# Patient Record
Sex: Female | Born: 1940 | Race: Black or African American | Hispanic: No | Marital: Single | Smoking: Former smoker
Health system: Southern US, Community
[De-identification: ages and names within clinical notes are randomized; demographics above are authoritative.]

## PROBLEM LIST (undated history)

## (undated) DIAGNOSIS — F419 Anxiety disorder, unspecified: Secondary | ICD-10-CM

## (undated) DIAGNOSIS — Z85528 Personal history of other malignant neoplasm of kidney: Secondary | ICD-10-CM

## (undated) DIAGNOSIS — K279 Peptic ulcer, site unspecified, unspecified as acute or chronic, without hemorrhage or perforation: Secondary | ICD-10-CM

## (undated) DIAGNOSIS — M75101 Unspecified rotator cuff tear or rupture of right shoulder, not specified as traumatic: Secondary | ICD-10-CM

## (undated) DIAGNOSIS — E785 Hyperlipidemia, unspecified: Secondary | ICD-10-CM

## (undated) DIAGNOSIS — E7800 Pure hypercholesterolemia, unspecified: Secondary | ICD-10-CM

## (undated) DIAGNOSIS — F32A Depression, unspecified: Secondary | ICD-10-CM

## (undated) DIAGNOSIS — G43909 Migraine, unspecified, not intractable, without status migrainosus: Secondary | ICD-10-CM

## (undated) DIAGNOSIS — F329 Major depressive disorder, single episode, unspecified: Secondary | ICD-10-CM

## (undated) DIAGNOSIS — J449 Chronic obstructive pulmonary disease, unspecified: Secondary | ICD-10-CM

## (undated) DIAGNOSIS — M545 Low back pain, unspecified: Secondary | ICD-10-CM

## (undated) DIAGNOSIS — I10 Essential (primary) hypertension: Secondary | ICD-10-CM

## (undated) DIAGNOSIS — IMO0002 Reserved for concepts with insufficient information to code with codable children: Secondary | ICD-10-CM

## (undated) DIAGNOSIS — I739 Peripheral vascular disease, unspecified: Secondary | ICD-10-CM

## (undated) DIAGNOSIS — K581 Irritable bowel syndrome with constipation: Secondary | ICD-10-CM

## (undated) DIAGNOSIS — M654 Radial styloid tenosynovitis [de Quervain]: Secondary | ICD-10-CM

## (undated) DIAGNOSIS — M1990 Unspecified osteoarthritis, unspecified site: Secondary | ICD-10-CM

## (undated) HISTORY — DX: Unspecified osteoarthritis, unspecified site: M19.90

## (undated) HISTORY — DX: Pure hypercholesterolemia, unspecified: E78.00

## (undated) HISTORY — DX: Irritable bowel syndrome with constipation: K58.1

## (undated) HISTORY — PX: PR VEIN BYPASS GRAFT,AORTO-FEM-POP: 35551

## (undated) HISTORY — DX: Unspecified rotator cuff tear or rupture of right shoulder, not specified as traumatic: M75.101

## (undated) HISTORY — DX: Low back pain: M54.5

## (undated) HISTORY — DX: Peptic ulcer, site unspecified, unspecified as acute or chronic, without hemorrhage or perforation: K27.9

## (undated) HISTORY — DX: Peripheral vascular disease, unspecified: I73.9

## (undated) HISTORY — DX: Personal history of other malignant neoplasm of kidney: Z85.528

## (undated) HISTORY — DX: Migraine, unspecified, not intractable, without status migrainosus: G43.909

## (undated) HISTORY — DX: Low back pain, unspecified: M54.50

## (undated) HISTORY — DX: Reserved for concepts with insufficient information to code with codable children: IMO0002

## (undated) HISTORY — DX: Depression, unspecified: F32.A

## (undated) HISTORY — DX: Radial styloid tenosynovitis (de quervain): M65.4

## (undated) HISTORY — DX: Essential (primary) hypertension: I10

## (undated) HISTORY — DX: Hyperlipidemia, unspecified: E78.5

## (undated) HISTORY — DX: Major depressive disorder, single episode, unspecified: F32.9

## (undated) HISTORY — DX: Anxiety disorder, unspecified: F41.9

## (undated) HISTORY — PX: OTHER SURGICAL HISTORY: SHX169

---

## 2001-07-06 ENCOUNTER — Ambulatory Visit (HOSPITAL_COMMUNITY): Admission: RE | Admit: 2001-07-06 | Discharge: 2001-07-06 | Payer: Self-pay | Admitting: Internal Medicine

## 2003-04-17 ENCOUNTER — Emergency Department (HOSPITAL_COMMUNITY): Admission: EM | Admit: 2003-04-17 | Discharge: 2003-04-18 | Payer: Self-pay | Admitting: Emergency Medicine

## 2004-03-16 ENCOUNTER — Ambulatory Visit (HOSPITAL_COMMUNITY): Admission: RE | Admit: 2004-03-16 | Discharge: 2004-03-16 | Payer: Self-pay | Admitting: Emergency Medicine

## 2006-05-16 ENCOUNTER — Emergency Department (HOSPITAL_COMMUNITY): Admission: EM | Admit: 2006-05-16 | Discharge: 2006-05-16 | Payer: Self-pay | Admitting: Emergency Medicine

## 2006-06-07 ENCOUNTER — Ambulatory Visit: Payer: Self-pay | Admitting: Internal Medicine

## 2006-06-07 ENCOUNTER — Ambulatory Visit (HOSPITAL_COMMUNITY): Admission: RE | Admit: 2006-06-07 | Discharge: 2006-06-07 | Payer: Self-pay | Admitting: Internal Medicine

## 2006-06-07 DIAGNOSIS — F411 Generalized anxiety disorder: Secondary | ICD-10-CM | POA: Insufficient documentation

## 2006-06-07 DIAGNOSIS — I10 Essential (primary) hypertension: Secondary | ICD-10-CM | POA: Insufficient documentation

## 2006-06-07 DIAGNOSIS — E785 Hyperlipidemia, unspecified: Secondary | ICD-10-CM | POA: Insufficient documentation

## 2006-06-07 DIAGNOSIS — K279 Peptic ulcer, site unspecified, unspecified as acute or chronic, without hemorrhage or perforation: Secondary | ICD-10-CM | POA: Insufficient documentation

## 2006-06-07 DIAGNOSIS — M545 Low back pain, unspecified: Secondary | ICD-10-CM | POA: Insufficient documentation

## 2006-06-07 DIAGNOSIS — F329 Major depressive disorder, single episode, unspecified: Secondary | ICD-10-CM | POA: Insufficient documentation

## 2006-06-07 DIAGNOSIS — J309 Allergic rhinitis, unspecified: Secondary | ICD-10-CM | POA: Insufficient documentation

## 2006-06-09 ENCOUNTER — Encounter (INDEPENDENT_AMBULATORY_CARE_PROVIDER_SITE_OTHER): Payer: Self-pay | Admitting: Internal Medicine

## 2006-06-15 ENCOUNTER — Encounter (INDEPENDENT_AMBULATORY_CARE_PROVIDER_SITE_OTHER): Payer: Self-pay | Admitting: Internal Medicine

## 2006-06-28 ENCOUNTER — Ambulatory Visit (HOSPITAL_COMMUNITY): Admission: RE | Admit: 2006-06-28 | Discharge: 2006-06-28 | Payer: Self-pay | Admitting: Internal Medicine

## 2006-07-05 ENCOUNTER — Ambulatory Visit: Payer: Self-pay | Admitting: Internal Medicine

## 2006-08-01 ENCOUNTER — Ambulatory Visit: Payer: Self-pay | Admitting: Internal Medicine

## 2006-08-01 ENCOUNTER — Ambulatory Visit (HOSPITAL_COMMUNITY): Admission: RE | Admit: 2006-08-01 | Discharge: 2006-08-01 | Payer: Self-pay | Admitting: Internal Medicine

## 2006-08-01 ENCOUNTER — Telehealth (INDEPENDENT_AMBULATORY_CARE_PROVIDER_SITE_OTHER): Payer: Self-pay | Admitting: *Deleted

## 2006-08-02 ENCOUNTER — Encounter (INDEPENDENT_AMBULATORY_CARE_PROVIDER_SITE_OTHER): Payer: Self-pay | Admitting: Internal Medicine

## 2006-08-02 ENCOUNTER — Telehealth (INDEPENDENT_AMBULATORY_CARE_PROVIDER_SITE_OTHER): Payer: Self-pay | Admitting: *Deleted

## 2006-08-02 LAB — CONVERTED CEMR LAB
ALT: 14 units/L (ref 0–35)
AST: 13 units/L (ref 0–37)
Albumin: 4.6 g/dL (ref 3.5–5.2)
Alkaline Phosphatase: 89 units/L (ref 39–117)
BUN: 18 mg/dL (ref 6–23)
Basophils Absolute: 0 10*3/uL (ref 0.0–0.1)
Basophils Relative: 1 % (ref 0–1)
CO2: 20 meq/L (ref 19–32)
Calcium: 9.7 mg/dL (ref 8.4–10.5)
Chloride: 103 meq/L (ref 96–112)
Creatinine, Ser: 0.63 mg/dL (ref 0.40–1.20)
Eosinophils Absolute: 0.1 10*3/uL (ref 0.0–0.7)
Eosinophils Relative: 2 % (ref 0–5)
Glucose, Bld: 101 mg/dL — ABNORMAL HIGH (ref 70–99)
HCT: 43.1 % (ref 36.0–46.0)
Hemoglobin: 14.2 g/dL (ref 12.0–15.0)
Lymphocytes Relative: 38 % (ref 12–46)
Lymphs Abs: 2 10*3/uL (ref 0.7–3.3)
MCHC: 32.9 g/dL (ref 30.0–36.0)
MCV: 89.2 fL (ref 78.0–100.0)
Monocytes Absolute: 0.3 10*3/uL (ref 0.2–0.7)
Monocytes Relative: 6 % (ref 3–11)
Neutro Abs: 2.8 10*3/uL (ref 1.7–7.7)
Neutrophils Relative %: 54 % (ref 43–77)
Platelets: 221 10*3/uL (ref 150–400)
Potassium: 4.6 meq/L (ref 3.5–5.3)
Prealbumin: 24.5 mg/dL (ref 18.0–45.0)
RBC: 4.83 M/uL (ref 3.87–5.11)
RDW: 14 % (ref 11.5–14.0)
Sodium: 140 meq/L (ref 135–145)
TSH: 0.533 microintl units/mL (ref 0.350–5.50)
Total Bilirubin: 0.4 mg/dL (ref 0.3–1.2)
Total Protein: 7.6 g/dL (ref 6.0–8.3)
WBC: 5.3 10*3/uL (ref 4.0–10.5)

## 2006-08-03 ENCOUNTER — Encounter (INDEPENDENT_AMBULATORY_CARE_PROVIDER_SITE_OTHER): Payer: Self-pay | Admitting: Internal Medicine

## 2006-08-03 DIAGNOSIS — R930 Abnormal findings on diagnostic imaging of skull and head, not elsewhere classified: Secondary | ICD-10-CM | POA: Insufficient documentation

## 2006-08-04 ENCOUNTER — Encounter (INDEPENDENT_AMBULATORY_CARE_PROVIDER_SITE_OTHER): Payer: Self-pay | Admitting: Internal Medicine

## 2006-08-04 ENCOUNTER — Ambulatory Visit (HOSPITAL_COMMUNITY): Admission: RE | Admit: 2006-08-04 | Discharge: 2006-08-04 | Payer: Self-pay | Admitting: Internal Medicine

## 2006-08-04 LAB — HM MAMMOGRAPHY: HM Mammogram: NORMAL

## 2006-08-08 ENCOUNTER — Ambulatory Visit (HOSPITAL_COMMUNITY): Admission: RE | Admit: 2006-08-08 | Discharge: 2006-08-08 | Payer: Self-pay | Admitting: Internal Medicine

## 2006-08-08 ENCOUNTER — Encounter (INDEPENDENT_AMBULATORY_CARE_PROVIDER_SITE_OTHER): Payer: Self-pay | Admitting: Internal Medicine

## 2006-08-09 ENCOUNTER — Telehealth (INDEPENDENT_AMBULATORY_CARE_PROVIDER_SITE_OTHER): Payer: Self-pay | Admitting: *Deleted

## 2006-08-17 ENCOUNTER — Encounter (INDEPENDENT_AMBULATORY_CARE_PROVIDER_SITE_OTHER): Payer: Self-pay | Admitting: Internal Medicine

## 2006-08-17 ENCOUNTER — Telehealth (INDEPENDENT_AMBULATORY_CARE_PROVIDER_SITE_OTHER): Payer: Self-pay | Admitting: *Deleted

## 2006-08-26 ENCOUNTER — Ambulatory Visit: Payer: Self-pay | Admitting: Internal Medicine

## 2006-09-23 ENCOUNTER — Ambulatory Visit: Payer: Self-pay | Admitting: Internal Medicine

## 2006-09-23 LAB — CONVERTED CEMR LAB
Bilirubin Urine: NEGATIVE
Glucose, Urine, Semiquant: NEGATIVE
Nitrite: NEGATIVE
Protein, U semiquant: NEGATIVE
Specific Gravity, Urine: 1.02
Urobilinogen, UA: 0.2
WBC Urine, dipstick: NEGATIVE
pH: 5.5

## 2006-09-27 ENCOUNTER — Telehealth (INDEPENDENT_AMBULATORY_CARE_PROVIDER_SITE_OTHER): Payer: Self-pay | Admitting: *Deleted

## 2006-09-27 ENCOUNTER — Ambulatory Visit (HOSPITAL_COMMUNITY): Admission: RE | Admit: 2006-09-27 | Discharge: 2006-09-27 | Payer: Self-pay | Admitting: Internal Medicine

## 2006-10-03 ENCOUNTER — Encounter (INDEPENDENT_AMBULATORY_CARE_PROVIDER_SITE_OTHER): Payer: Self-pay | Admitting: Internal Medicine

## 2006-10-05 ENCOUNTER — Encounter (INDEPENDENT_AMBULATORY_CARE_PROVIDER_SITE_OTHER): Payer: Self-pay | Admitting: Internal Medicine

## 2006-10-05 ENCOUNTER — Telehealth (INDEPENDENT_AMBULATORY_CARE_PROVIDER_SITE_OTHER): Payer: Self-pay | Admitting: Internal Medicine

## 2006-10-06 ENCOUNTER — Telehealth (INDEPENDENT_AMBULATORY_CARE_PROVIDER_SITE_OTHER): Payer: Self-pay | Admitting: *Deleted

## 2006-10-07 ENCOUNTER — Ambulatory Visit: Payer: Self-pay | Admitting: Internal Medicine

## 2006-10-13 ENCOUNTER — Ambulatory Visit (HOSPITAL_COMMUNITY): Admission: RE | Admit: 2006-10-13 | Discharge: 2006-10-13 | Payer: Self-pay | Admitting: Internal Medicine

## 2006-10-13 ENCOUNTER — Ambulatory Visit: Payer: Self-pay | Admitting: Internal Medicine

## 2006-11-02 ENCOUNTER — Encounter (INDEPENDENT_AMBULATORY_CARE_PROVIDER_SITE_OTHER): Payer: Self-pay | Admitting: Internal Medicine

## 2006-12-05 ENCOUNTER — Ambulatory Visit: Payer: Self-pay | Admitting: Internal Medicine

## 2006-12-05 LAB — CONVERTED CEMR LAB
Bilirubin Urine: NEGATIVE
Glucose, Urine, Semiquant: NEGATIVE
Ketones, urine, test strip: NEGATIVE
Nitrite: NEGATIVE
Protein, U semiquant: NEGATIVE
Specific Gravity, Urine: 1.02
Urobilinogen, UA: 0.2
WBC Urine, dipstick: NEGATIVE
pH: 5.5

## 2006-12-06 ENCOUNTER — Encounter (INDEPENDENT_AMBULATORY_CARE_PROVIDER_SITE_OTHER): Payer: Self-pay | Admitting: Internal Medicine

## 2006-12-06 LAB — CONVERTED CEMR LAB
RBC / HPF: NONE SEEN (ref <3)
WBC, UA: NONE SEEN cells/hpf (ref <3)

## 2006-12-19 ENCOUNTER — Encounter (INDEPENDENT_AMBULATORY_CARE_PROVIDER_SITE_OTHER): Payer: Self-pay | Admitting: Internal Medicine

## 2007-01-02 ENCOUNTER — Ambulatory Visit: Payer: Self-pay | Admitting: Internal Medicine

## 2007-01-02 DIAGNOSIS — M1990 Unspecified osteoarthritis, unspecified site: Secondary | ICD-10-CM | POA: Insufficient documentation

## 2007-02-01 ENCOUNTER — Telehealth (INDEPENDENT_AMBULATORY_CARE_PROVIDER_SITE_OTHER): Payer: Self-pay | Admitting: *Deleted

## 2007-02-01 ENCOUNTER — Ambulatory Visit: Payer: Self-pay | Admitting: Internal Medicine

## 2007-04-12 ENCOUNTER — Telehealth (INDEPENDENT_AMBULATORY_CARE_PROVIDER_SITE_OTHER): Payer: Self-pay | Admitting: *Deleted

## 2007-04-12 ENCOUNTER — Ambulatory Visit: Payer: Self-pay | Admitting: Internal Medicine

## 2007-04-13 ENCOUNTER — Encounter (INDEPENDENT_AMBULATORY_CARE_PROVIDER_SITE_OTHER): Payer: Self-pay | Admitting: Internal Medicine

## 2007-04-13 LAB — CONVERTED CEMR LAB
BUN: 14 mg/dL (ref 6–23)
CO2: 24 meq/L (ref 19–32)
Calcium: 9.4 mg/dL (ref 8.4–10.5)
Chloride: 106 meq/L (ref 96–112)
Cholesterol: 233 mg/dL — ABNORMAL HIGH (ref 0–200)
Creatinine, Ser: 0.62 mg/dL (ref 0.40–1.20)
Glucose, Bld: 148 mg/dL — ABNORMAL HIGH (ref 70–99)
HDL: 75 mg/dL (ref >39)
LDL Cholesterol: 148 mg/dL — ABNORMAL HIGH (ref 0–99)
Potassium: 3.8 meq/L (ref 3.5–5.3)
Sodium: 142 meq/L (ref 135–145)
Total CHOL/HDL Ratio: 3.1
Triglycerides: 51 mg/dL (ref <150)
VLDL: 10 mg/dL (ref 0–40)

## 2007-04-18 ENCOUNTER — Ambulatory Visit (HOSPITAL_COMMUNITY): Admission: RE | Admit: 2007-04-18 | Discharge: 2007-04-18 | Payer: Self-pay | Admitting: Internal Medicine

## 2007-04-19 ENCOUNTER — Encounter (INDEPENDENT_AMBULATORY_CARE_PROVIDER_SITE_OTHER): Payer: Self-pay | Admitting: Internal Medicine

## 2007-04-19 ENCOUNTER — Telehealth (INDEPENDENT_AMBULATORY_CARE_PROVIDER_SITE_OTHER): Payer: Self-pay | Admitting: *Deleted

## 2007-04-21 ENCOUNTER — Ambulatory Visit (HOSPITAL_COMMUNITY): Admission: RE | Admit: 2007-04-21 | Discharge: 2007-04-21 | Payer: Self-pay | Admitting: Internal Medicine

## 2007-04-21 ENCOUNTER — Encounter (INDEPENDENT_AMBULATORY_CARE_PROVIDER_SITE_OTHER): Payer: Self-pay | Admitting: Internal Medicine

## 2007-04-24 ENCOUNTER — Encounter (INDEPENDENT_AMBULATORY_CARE_PROVIDER_SITE_OTHER): Payer: Self-pay | Admitting: Internal Medicine

## 2007-05-10 ENCOUNTER — Encounter (INDEPENDENT_AMBULATORY_CARE_PROVIDER_SITE_OTHER): Payer: Self-pay | Admitting: Internal Medicine

## 2007-05-17 ENCOUNTER — Encounter (HOSPITAL_COMMUNITY): Admission: RE | Admit: 2007-05-17 | Discharge: 2007-06-16 | Payer: Self-pay | Admitting: Neurosurgery

## 2007-06-20 ENCOUNTER — Encounter (HOSPITAL_COMMUNITY): Admission: RE | Admit: 2007-06-20 | Discharge: 2007-07-20 | Payer: Self-pay | Admitting: Neurosurgery

## 2007-07-11 ENCOUNTER — Ambulatory Visit: Payer: Self-pay | Admitting: Internal Medicine

## 2007-07-11 DIAGNOSIS — M79609 Pain in unspecified limb: Secondary | ICD-10-CM | POA: Insufficient documentation

## 2007-07-11 DIAGNOSIS — I739 Peripheral vascular disease, unspecified: Secondary | ICD-10-CM | POA: Insufficient documentation

## 2007-07-12 ENCOUNTER — Ambulatory Visit (HOSPITAL_COMMUNITY): Admission: RE | Admit: 2007-07-12 | Discharge: 2007-07-12 | Payer: Self-pay | Admitting: Internal Medicine

## 2007-07-18 DIAGNOSIS — D4959 Neoplasm of unspecified behavior of other genitourinary organ: Secondary | ICD-10-CM | POA: Insufficient documentation

## 2007-07-20 ENCOUNTER — Encounter (INDEPENDENT_AMBULATORY_CARE_PROVIDER_SITE_OTHER): Payer: Self-pay | Admitting: Internal Medicine

## 2007-07-26 ENCOUNTER — Encounter (INDEPENDENT_AMBULATORY_CARE_PROVIDER_SITE_OTHER): Payer: Self-pay | Admitting: Internal Medicine

## 2007-07-26 LAB — CONVERTED CEMR LAB: Creatinine, Ser: 0.73 mg/dL (ref 0.40–1.20)

## 2007-07-31 ENCOUNTER — Ambulatory Visit (HOSPITAL_COMMUNITY): Admission: RE | Admit: 2007-07-31 | Discharge: 2007-07-31 | Payer: Self-pay | Admitting: Internal Medicine

## 2007-07-31 ENCOUNTER — Encounter (INDEPENDENT_AMBULATORY_CARE_PROVIDER_SITE_OTHER): Payer: Self-pay | Admitting: Internal Medicine

## 2007-08-03 ENCOUNTER — Encounter (INDEPENDENT_AMBULATORY_CARE_PROVIDER_SITE_OTHER): Payer: Self-pay | Admitting: Internal Medicine

## 2007-08-07 ENCOUNTER — Ambulatory Visit: Payer: Self-pay | Admitting: Surgery

## 2007-08-07 ENCOUNTER — Encounter (INDEPENDENT_AMBULATORY_CARE_PROVIDER_SITE_OTHER): Payer: Self-pay | Admitting: Internal Medicine

## 2007-08-24 ENCOUNTER — Ambulatory Visit: Payer: Self-pay | Admitting: Surgery

## 2007-08-24 ENCOUNTER — Ambulatory Visit (HOSPITAL_COMMUNITY): Admission: RE | Admit: 2007-08-24 | Discharge: 2007-08-24 | Payer: Self-pay | Admitting: Surgery

## 2007-11-02 ENCOUNTER — Encounter (INDEPENDENT_AMBULATORY_CARE_PROVIDER_SITE_OTHER): Payer: Self-pay | Admitting: Internal Medicine

## 2007-11-06 ENCOUNTER — Ambulatory Visit: Payer: Self-pay | Admitting: Surgery

## 2007-12-06 ENCOUNTER — Encounter (INDEPENDENT_AMBULATORY_CARE_PROVIDER_SITE_OTHER): Payer: Self-pay | Admitting: Internal Medicine

## 2007-12-25 ENCOUNTER — Ambulatory Visit: Payer: Self-pay | Admitting: Surgery

## 2008-01-24 ENCOUNTER — Inpatient Hospital Stay (HOSPITAL_COMMUNITY): Admission: RE | Admit: 2008-01-24 | Discharge: 2008-01-28 | Payer: Self-pay | Admitting: Surgery

## 2008-01-24 ENCOUNTER — Ambulatory Visit: Payer: Self-pay | Admitting: Surgery

## 2008-01-25 ENCOUNTER — Encounter: Payer: Self-pay | Admitting: Surgery

## 2008-01-30 ENCOUNTER — Encounter (INDEPENDENT_AMBULATORY_CARE_PROVIDER_SITE_OTHER): Payer: Self-pay | Admitting: Internal Medicine

## 2008-02-12 ENCOUNTER — Ambulatory Visit: Payer: Self-pay | Admitting: Surgery

## 2008-02-21 ENCOUNTER — Encounter (INDEPENDENT_AMBULATORY_CARE_PROVIDER_SITE_OTHER): Payer: Self-pay | Admitting: Internal Medicine

## 2008-04-09 ENCOUNTER — Ambulatory Visit: Payer: Self-pay | Admitting: Internal Medicine

## 2008-04-09 DIAGNOSIS — F17200 Nicotine dependence, unspecified, uncomplicated: Secondary | ICD-10-CM | POA: Insufficient documentation

## 2008-04-09 DIAGNOSIS — M25519 Pain in unspecified shoulder: Secondary | ICD-10-CM | POA: Insufficient documentation

## 2008-04-17 ENCOUNTER — Encounter (INDEPENDENT_AMBULATORY_CARE_PROVIDER_SITE_OTHER): Payer: Self-pay | Admitting: Internal Medicine

## 2008-04-17 LAB — CONVERTED CEMR LAB
ALT: <8 units/L (ref 0–35)
AST: 10 units/L (ref 0–37)
Albumin: 4.5 g/dL (ref 3.5–5.2)
Alkaline Phosphatase: 81 units/L (ref 39–117)
BUN: 10 mg/dL (ref 6–23)
CO2: 25 meq/L (ref 19–32)
Calcium: 9.8 mg/dL (ref 8.4–10.5)
Chloride: 103 meq/L (ref 96–112)
Cholesterol: 246 mg/dL — ABNORMAL HIGH (ref 0–200)
Creatinine, Ser: 0.64 mg/dL (ref 0.40–1.20)
Glucose, Bld: 78 mg/dL (ref 70–99)
HDL: 79 mg/dL (ref >39)
LDL Cholesterol: 153 mg/dL — ABNORMAL HIGH (ref 0–99)
Potassium: 4.7 meq/L (ref 3.5–5.3)
Sodium: 143 meq/L (ref 135–145)
Total Bilirubin: 0.4 mg/dL (ref 0.3–1.2)
Total CHOL/HDL Ratio: 3.1
Total Protein: 7.4 g/dL (ref 6.0–8.3)
Triglycerides: 71 mg/dL (ref <150)
VLDL: 14 mg/dL (ref 0–40)

## 2008-04-29 ENCOUNTER — Ambulatory Visit: Payer: Self-pay | Admitting: Surgery

## 2008-05-02 ENCOUNTER — Encounter (INDEPENDENT_AMBULATORY_CARE_PROVIDER_SITE_OTHER): Payer: Self-pay | Admitting: Internal Medicine

## 2008-05-07 ENCOUNTER — Ambulatory Visit: Payer: Self-pay | Admitting: Internal Medicine

## 2008-05-14 ENCOUNTER — Telehealth (INDEPENDENT_AMBULATORY_CARE_PROVIDER_SITE_OTHER): Payer: Self-pay | Admitting: Internal Medicine

## 2008-05-29 ENCOUNTER — Encounter (INDEPENDENT_AMBULATORY_CARE_PROVIDER_SITE_OTHER): Payer: Self-pay | Admitting: Internal Medicine

## 2008-07-02 ENCOUNTER — Ambulatory Visit (HOSPITAL_COMMUNITY): Admission: RE | Admit: 2008-07-02 | Discharge: 2008-07-02 | Payer: Self-pay | Admitting: Internal Medicine

## 2008-07-02 ENCOUNTER — Ambulatory Visit: Payer: Self-pay | Admitting: Internal Medicine

## 2008-07-09 ENCOUNTER — Encounter (INDEPENDENT_AMBULATORY_CARE_PROVIDER_SITE_OTHER): Payer: Self-pay | Admitting: Internal Medicine

## 2008-07-09 ENCOUNTER — Encounter (HOSPITAL_COMMUNITY): Admission: RE | Admit: 2008-07-09 | Discharge: 2008-08-08 | Payer: Self-pay | Admitting: Internal Medicine

## 2008-07-17 ENCOUNTER — Encounter (INDEPENDENT_AMBULATORY_CARE_PROVIDER_SITE_OTHER): Payer: Self-pay | Admitting: Internal Medicine

## 2008-07-31 ENCOUNTER — Encounter (INDEPENDENT_AMBULATORY_CARE_PROVIDER_SITE_OTHER): Payer: Self-pay | Admitting: Internal Medicine

## 2008-08-12 ENCOUNTER — Ambulatory Visit: Payer: Self-pay | Admitting: Surgery

## 2008-08-14 ENCOUNTER — Encounter (HOSPITAL_COMMUNITY): Admission: RE | Admit: 2008-08-14 | Discharge: 2008-09-13 | Payer: Self-pay | Admitting: Internal Medicine

## 2008-08-28 ENCOUNTER — Encounter (INDEPENDENT_AMBULATORY_CARE_PROVIDER_SITE_OTHER): Payer: Self-pay | Admitting: Internal Medicine

## 2008-09-03 ENCOUNTER — Encounter (INDEPENDENT_AMBULATORY_CARE_PROVIDER_SITE_OTHER): Payer: Self-pay | Admitting: Internal Medicine

## 2008-09-04 ENCOUNTER — Ambulatory Visit: Payer: Self-pay | Admitting: Internal Medicine

## 2008-09-05 ENCOUNTER — Ambulatory Visit (HOSPITAL_COMMUNITY): Admission: RE | Admit: 2008-09-05 | Discharge: 2008-09-05 | Payer: Self-pay | Admitting: Internal Medicine

## 2008-09-05 ENCOUNTER — Encounter: Payer: Self-pay | Admitting: Orthopedic Surgery

## 2008-09-12 ENCOUNTER — Telehealth (INDEPENDENT_AMBULATORY_CARE_PROVIDER_SITE_OTHER): Payer: Self-pay | Admitting: *Deleted

## 2008-09-18 ENCOUNTER — Encounter (INDEPENDENT_AMBULATORY_CARE_PROVIDER_SITE_OTHER): Payer: Self-pay | Admitting: Internal Medicine

## 2008-10-03 ENCOUNTER — Ambulatory Visit: Payer: Self-pay | Admitting: Orthopedic Surgery

## 2008-10-03 DIAGNOSIS — M75120 Complete rotator cuff tear or rupture of unspecified shoulder, not specified as traumatic: Secondary | ICD-10-CM | POA: Insufficient documentation

## 2008-10-04 ENCOUNTER — Ambulatory Visit: Payer: Self-pay | Admitting: Internal Medicine

## 2008-10-05 ENCOUNTER — Encounter (INDEPENDENT_AMBULATORY_CARE_PROVIDER_SITE_OTHER): Payer: Self-pay | Admitting: Internal Medicine

## 2008-10-07 LAB — CONVERTED CEMR LAB
ALT: <8 units/L (ref 0–35)
AST: 12 units/L (ref 0–37)
Albumin: 4.4 g/dL (ref 3.5–5.2)
Alkaline Phosphatase: 67 units/L (ref 39–117)
BUN: 16 mg/dL (ref 6–23)
Basophils Absolute: 0 10*3/uL (ref 0.0–0.1)
Basophils Relative: 1 % (ref 0–1)
CO2: 24 meq/L (ref 19–32)
Calcium: 9.9 mg/dL (ref 8.4–10.5)
Chloride: 106 meq/L (ref 96–112)
Cholesterol: 191 mg/dL (ref 0–200)
Creatinine, Ser: 0.76 mg/dL (ref 0.40–1.20)
Eosinophils Absolute: 0.2 10*3/uL (ref 0.0–0.7)
Eosinophils Relative: 4 % (ref 0–5)
Glucose, Bld: 81 mg/dL (ref 70–99)
HCT: 41.4 % (ref 36.0–46.0)
HDL: 65 mg/dL (ref >39)
Hemoglobin: 13.8 g/dL (ref 12.0–15.0)
LDL Cholesterol: 116 mg/dL — ABNORMAL HIGH (ref 0–99)
Lymphocytes Relative: 31 % (ref 12–46)
Lymphs Abs: 2 10*3/uL (ref 0.7–4.0)
MCHC: 33.3 g/dL (ref 30.0–36.0)
MCV: 85 fL (ref 78.0–100.0)
Monocytes Absolute: 0.5 10*3/uL (ref 0.1–1.0)
Monocytes Relative: 8 % (ref 3–12)
Neutro Abs: 3.5 10*3/uL (ref 1.7–7.7)
Neutrophils Relative %: 56 % (ref 43–77)
Platelets: 220 10*3/uL (ref 150–400)
Potassium: 4.5 meq/L (ref 3.5–5.3)
RBC: 4.87 M/uL (ref 3.87–5.11)
RDW: 14.7 % (ref 11.5–15.5)
Sodium: 139 meq/L (ref 135–145)
Total Bilirubin: 0.5 mg/dL (ref 0.3–1.2)
Total CHOL/HDL Ratio: 2.9
Total Protein: 7.1 g/dL (ref 6.0–8.3)
Triglycerides: 51 mg/dL (ref <150)
VLDL: 10 mg/dL (ref 0–40)
WBC: 6.3 10*3/uL (ref 4.0–10.5)

## 2008-10-14 ENCOUNTER — Ambulatory Visit: Payer: Self-pay | Admitting: Orthopedic Surgery

## 2008-10-16 ENCOUNTER — Encounter (INDEPENDENT_AMBULATORY_CARE_PROVIDER_SITE_OTHER): Payer: Self-pay | Admitting: Internal Medicine

## 2008-10-21 ENCOUNTER — Ambulatory Visit: Payer: Self-pay | Admitting: Surgery

## 2008-10-23 ENCOUNTER — Telehealth: Payer: Self-pay | Admitting: Orthopedic Surgery

## 2008-10-24 ENCOUNTER — Encounter: Payer: Self-pay | Admitting: Orthopedic Surgery

## 2008-10-25 ENCOUNTER — Ambulatory Visit: Payer: Self-pay | Admitting: Orthopedic Surgery

## 2008-10-25 ENCOUNTER — Ambulatory Visit (HOSPITAL_COMMUNITY): Admission: RE | Admit: 2008-10-25 | Discharge: 2008-10-25 | Payer: Self-pay | Admitting: Orthopedic Surgery

## 2008-10-28 ENCOUNTER — Telehealth (INDEPENDENT_AMBULATORY_CARE_PROVIDER_SITE_OTHER): Payer: Self-pay | Admitting: *Deleted

## 2008-10-29 ENCOUNTER — Ambulatory Visit: Payer: Self-pay | Admitting: Orthopedic Surgery

## 2008-10-30 ENCOUNTER — Encounter (HOSPITAL_COMMUNITY): Admission: RE | Admit: 2008-10-30 | Discharge: 2008-11-29 | Payer: Self-pay | Admitting: Orthopedic Surgery

## 2008-10-30 ENCOUNTER — Encounter: Payer: Self-pay | Admitting: Orthopedic Surgery

## 2008-11-05 ENCOUNTER — Ambulatory Visit: Payer: Self-pay | Admitting: Orthopedic Surgery

## 2008-11-30 ENCOUNTER — Encounter (HOSPITAL_COMMUNITY): Admission: RE | Admit: 2008-11-30 | Discharge: 2008-12-30 | Payer: Self-pay | Admitting: Orthopedic Surgery

## 2008-12-02 ENCOUNTER — Encounter: Payer: Self-pay | Admitting: Orthopedic Surgery

## 2008-12-03 ENCOUNTER — Ambulatory Visit: Payer: Self-pay | Admitting: Orthopedic Surgery

## 2009-01-01 ENCOUNTER — Encounter (HOSPITAL_COMMUNITY): Admission: RE | Admit: 2009-01-01 | Discharge: 2009-01-15 | Payer: Self-pay | Admitting: Orthopedic Surgery

## 2009-01-07 ENCOUNTER — Encounter: Payer: Self-pay | Admitting: Orthopedic Surgery

## 2009-01-21 ENCOUNTER — Ambulatory Visit: Payer: Self-pay | Admitting: Orthopedic Surgery

## 2009-01-29 ENCOUNTER — Inpatient Hospital Stay (HOSPITAL_COMMUNITY): Admission: EM | Admit: 2009-01-29 | Discharge: 2009-02-04 | Payer: Self-pay | Admitting: Emergency Medicine

## 2009-01-29 ENCOUNTER — Ambulatory Visit: Payer: Self-pay | Admitting: Orthopedic Surgery

## 2009-01-30 ENCOUNTER — Encounter: Payer: Self-pay | Admitting: Orthopedic Surgery

## 2009-02-03 ENCOUNTER — Encounter: Payer: Self-pay | Admitting: Orthopedic Surgery

## 2009-02-04 ENCOUNTER — Inpatient Hospital Stay: Admission: AD | Admit: 2009-02-04 | Discharge: 2009-02-19 | Payer: Self-pay | Admitting: Internal Medicine

## 2009-02-07 ENCOUNTER — Ambulatory Visit (HOSPITAL_COMMUNITY): Admission: RE | Admit: 2009-02-07 | Discharge: 2009-02-07 | Payer: Self-pay | Admitting: Internal Medicine

## 2009-02-10 ENCOUNTER — Encounter: Payer: Self-pay | Admitting: Orthopedic Surgery

## 2009-02-12 ENCOUNTER — Ambulatory Visit: Payer: Self-pay | Admitting: Orthopedic Surgery

## 2009-02-12 DIAGNOSIS — S82843A Displaced bimalleolar fracture of unspecified lower leg, initial encounter for closed fracture: Secondary | ICD-10-CM | POA: Insufficient documentation

## 2009-02-19 ENCOUNTER — Ambulatory Visit: Payer: Self-pay | Admitting: Orthopedic Surgery

## 2009-02-21 ENCOUNTER — Encounter: Payer: Self-pay | Admitting: Orthopedic Surgery

## 2009-02-24 ENCOUNTER — Encounter: Payer: Self-pay | Admitting: Orthopedic Surgery

## 2009-03-04 ENCOUNTER — Encounter: Payer: Self-pay | Admitting: Orthopedic Surgery

## 2009-03-06 ENCOUNTER — Ambulatory Visit: Payer: Self-pay | Admitting: Orthopedic Surgery

## 2009-03-07 ENCOUNTER — Encounter: Payer: Self-pay | Admitting: Orthopedic Surgery

## 2009-03-07 ENCOUNTER — Telehealth: Payer: Self-pay | Admitting: Orthopedic Surgery

## 2009-03-12 ENCOUNTER — Encounter: Payer: Self-pay | Admitting: Orthopedic Surgery

## 2009-03-13 ENCOUNTER — Ambulatory Visit: Payer: Self-pay | Admitting: Orthopedic Surgery

## 2009-03-19 ENCOUNTER — Encounter: Payer: Self-pay | Admitting: Orthopedic Surgery

## 2009-03-27 ENCOUNTER — Ambulatory Visit: Payer: Self-pay | Admitting: Orthopedic Surgery

## 2009-04-08 ENCOUNTER — Telehealth: Payer: Self-pay | Admitting: Orthopedic Surgery

## 2009-04-10 ENCOUNTER — Ambulatory Visit: Payer: Self-pay | Admitting: Orthopedic Surgery

## 2009-04-11 ENCOUNTER — Encounter: Payer: Self-pay | Admitting: Orthopedic Surgery

## 2009-04-16 ENCOUNTER — Encounter: Payer: Self-pay | Admitting: Orthopedic Surgery

## 2009-04-21 ENCOUNTER — Encounter: Payer: Self-pay | Admitting: Orthopedic Surgery

## 2009-04-21 ENCOUNTER — Telehealth: Payer: Self-pay | Admitting: Orthopedic Surgery

## 2009-04-24 ENCOUNTER — Ambulatory Visit: Payer: Self-pay | Admitting: Orthopedic Surgery

## 2009-04-30 ENCOUNTER — Encounter: Payer: Self-pay | Admitting: Orthopedic Surgery

## 2009-05-06 ENCOUNTER — Telehealth: Payer: Self-pay | Admitting: Orthopedic Surgery

## 2009-05-07 ENCOUNTER — Telehealth: Payer: Self-pay | Admitting: Orthopedic Surgery

## 2009-05-07 ENCOUNTER — Encounter: Payer: Self-pay | Admitting: Orthopedic Surgery

## 2009-05-08 ENCOUNTER — Ambulatory Visit: Payer: Self-pay | Admitting: Orthopedic Surgery

## 2009-05-09 ENCOUNTER — Encounter: Payer: Self-pay | Admitting: Orthopedic Surgery

## 2009-05-15 ENCOUNTER — Ambulatory Visit: Payer: Self-pay | Admitting: Orthopedic Surgery

## 2009-05-16 ENCOUNTER — Encounter: Payer: Self-pay | Admitting: Orthopedic Surgery

## 2009-05-22 ENCOUNTER — Ambulatory Visit: Payer: Self-pay | Admitting: Orthopedic Surgery

## 2009-05-22 DIAGNOSIS — T8132XA Disruption of internal operation (surgical) wound, not elsewhere classified, initial encounter: Secondary | ICD-10-CM | POA: Insufficient documentation

## 2009-05-22 DIAGNOSIS — T81329A Deep disruption or dehiscence of operation wound, unspecified, initial encounter: Secondary | ICD-10-CM | POA: Insufficient documentation

## 2009-06-05 ENCOUNTER — Ambulatory Visit: Payer: Self-pay | Admitting: Orthopedic Surgery

## 2009-06-12 ENCOUNTER — Encounter: Payer: Self-pay | Admitting: Orthopedic Surgery

## 2009-06-19 ENCOUNTER — Ambulatory Visit: Payer: Self-pay | Admitting: Orthopedic Surgery

## 2009-06-26 ENCOUNTER — Telehealth: Payer: Self-pay | Admitting: Orthopedic Surgery

## 2009-07-10 ENCOUNTER — Encounter (INDEPENDENT_AMBULATORY_CARE_PROVIDER_SITE_OTHER): Payer: Self-pay | Admitting: *Deleted

## 2009-07-18 ENCOUNTER — Ambulatory Visit: Payer: Self-pay | Admitting: Orthopedic Surgery

## 2009-07-18 ENCOUNTER — Ambulatory Visit (HOSPITAL_COMMUNITY): Admission: RE | Admit: 2009-07-18 | Discharge: 2009-07-20 | Payer: Self-pay | Admitting: Orthopedic Surgery

## 2009-07-23 ENCOUNTER — Telehealth: Payer: Self-pay | Admitting: Orthopedic Surgery

## 2009-07-23 ENCOUNTER — Ambulatory Visit: Payer: Self-pay | Admitting: Orthopedic Surgery

## 2009-07-24 ENCOUNTER — Ambulatory Visit: Payer: Self-pay | Admitting: Orthopedic Surgery

## 2009-07-29 ENCOUNTER — Ambulatory Visit: Payer: Self-pay | Admitting: Orthopedic Surgery

## 2009-07-30 ENCOUNTER — Ambulatory Visit: Payer: Self-pay | Admitting: Orthopedic Surgery

## 2009-07-31 ENCOUNTER — Telehealth: Payer: Self-pay | Admitting: Orthopedic Surgery

## 2009-08-07 ENCOUNTER — Ambulatory Visit: Payer: Self-pay | Admitting: Orthopedic Surgery

## 2009-08-07 DIAGNOSIS — M24573 Contracture, unspecified ankle: Secondary | ICD-10-CM | POA: Insufficient documentation

## 2009-08-07 DIAGNOSIS — M24576 Contracture, unspecified foot: Secondary | ICD-10-CM

## 2009-08-13 ENCOUNTER — Telehealth: Payer: Self-pay | Admitting: Orthopedic Surgery

## 2009-08-13 ENCOUNTER — Encounter: Payer: Self-pay | Admitting: Orthopedic Surgery

## 2009-08-20 ENCOUNTER — Ambulatory Visit: Payer: Self-pay | Admitting: Orthopedic Surgery

## 2009-09-01 ENCOUNTER — Ambulatory Visit: Payer: Self-pay | Admitting: Orthopedic Surgery

## 2009-09-18 ENCOUNTER — Ambulatory Visit: Payer: Self-pay | Admitting: Orthopedic Surgery

## 2009-10-23 ENCOUNTER — Ambulatory Visit: Payer: Self-pay | Admitting: Orthopedic Surgery

## 2009-11-20 ENCOUNTER — Ambulatory Visit: Payer: Self-pay | Admitting: Orthopedic Surgery

## 2009-12-25 ENCOUNTER — Ambulatory Visit: Payer: Self-pay | Admitting: Orthopedic Surgery

## 2009-12-25 DIAGNOSIS — M8569 Other cyst of bone, multiple sites: Secondary | ICD-10-CM | POA: Insufficient documentation

## 2009-12-26 ENCOUNTER — Encounter: Payer: Self-pay | Admitting: Orthopedic Surgery

## 2009-12-29 ENCOUNTER — Encounter: Payer: Self-pay | Admitting: Orthopedic Surgery

## 2009-12-29 LAB — CONVERTED CEMR LAB: BUN: 17 mg/dL (ref 6–23)

## 2009-12-31 ENCOUNTER — Ambulatory Visit (HOSPITAL_COMMUNITY)
Admission: RE | Admit: 2009-12-31 | Discharge: 2009-12-31 | Payer: Self-pay | Source: Home / Self Care | Attending: Orthopedic Surgery | Admitting: Orthopedic Surgery

## 2009-12-31 ENCOUNTER — Telehealth: Payer: Self-pay | Admitting: Orthopedic Surgery

## 2009-12-31 LAB — CONVERTED CEMR LAB
Collection Interval-CRCL: 24 hr
Creatinine 24 HR UR: 726 mg/24hr (ref 700–1800)
Creatinine Clearance: 72 mL/min — ABNORMAL LOW (ref 75–115)
Creatinine, Urine: 88 mg/dL

## 2010-01-06 ENCOUNTER — Ambulatory Visit: Payer: Self-pay | Admitting: Orthopedic Surgery

## 2010-01-06 DIAGNOSIS — D179 Benign lipomatous neoplasm, unspecified: Secondary | ICD-10-CM | POA: Insufficient documentation

## 2010-02-08 ENCOUNTER — Encounter: Payer: Self-pay | Admitting: Internal Medicine

## 2010-02-08 ENCOUNTER — Encounter: Payer: Self-pay | Admitting: Family Medicine

## 2010-02-17 NOTE — Assessment & Plan Note (Signed)
Summary: 2 WK RE-CK WOUND LT ANKLE/EVERCARE,MEDICAI/CAF   Visit Type:  wound check Referring Provider:  ap er Primary Provider:  Erle Crocker MD  CC:  right ankle wound check.  History of Present Illness: I saw Alicia [name] in the office today for a followup visit.  She is a 70 years old woman with the complaint of:  wound check right ankle.  Allergies: 1)  Aspirin   Medications Added to Medication List This Visit: 1)  Bactrim Ds 800-160 Mg Tabs (Sulfamethoxazole-trimethoprim) .Marland Kitchen.. 1 by mouth two times a day 2)  Hydrocodone-acetaminophen 10-325 Mg Tabs (Hydrocodone-acetaminophen) .Marland Kitchen.. 1 -2 by mouth q 4 as needed pain  Patient Instructions: 1)  continue wound vac  2)  return in 2 weeks  Prescriptions: HYDROCODONE-ACETAMINOPHEN 10-325 MG TABS (HYDROCODONE-ACETAMINOPHEN) 1 -2 by mouth q 4 as needed pain  #90 x 5   Entered and Authorized by:   Fuller Canada MD   Signed by:   Fuller Canada MD on 04/24/2009   Method used:   Print then Give to Patient   RxID:   [id number] BACTRIM DS 800-160 MG TABS (SULFAMETHOXAZOLE-TRIMETHOPRIM) 1 by mouth two times a day  #60 x 0   Entered and Authorized by:   Fuller Canada MD   Signed by:   Fuller Canada MD on 04/24/2009   Method used:   Faxed to ...       [facility] (retail)       [address]       [place], [place]  [address]       Ph: [phone]       Fax: [phone]   RxID:   [id number]

## 2010-02-17 NOTE — Assessment & Plan Note (Signed)
Summary: 1 M RE-CK ANKLE/POST OP/EVERCARE,MCD/CAF   Visit Type:  Follow-up Referring Tyrian Peart:  ap er Primary Marlo Goodrich:  na  CC:  RECHECK ANKLE .  History of Present Illness: status post thickness skin graft and percutaneous Achilles tendon release after previous open treatment internal fixation of the right ankle  DATES OF SURGERY   OTIF ANKLE 1.12.2011  STSG 7.1.2011  Percocet 5 for pain, no refills needed. No ATBS are being taken.  Today is recheck.  the wound continues to decrease in size and has now stabilized very well.  It is approximately 5 mm long 2 mm deep and 2 mm wide.  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 2)  continue wound care

## 2010-02-17 NOTE — Assessment & Plan Note (Signed)
Summary: RE-CK/WOUND CHECK/POST OP/SEC HORIZ/CAF   Visit Type:  Follow-up Referring Provider:  ap er Primary Provider:  na  CC:  POST OP.  History of Present Illness: Alicia [name] comes in today for recheck and have the dressing on her RIGHT ankle after a split thickness skin graft and a percutaneous release of her Achilles tendon  07/18/09 DOS.  POD 12  Today is wound check.  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

## 2010-02-17 NOTE — Progress Notes (Signed)
Summary: wound vac hurting her leg  Phone Note Call from Patient   Summary of Call: Alicia [name] (08/23/1940) called today asking if she can remove the wound vac from her leg.  York Spaniel it is not draining right and is hurting her leg and her pain meds are not stoping her pain. Told her you are not in the office today and to call her homehealth nurse to check the machine. She called back and left a message that she could not get in touch with the nurse and she will just wait to see you on Thursday (05/08/09) I called [facility] and spoke with Dewayne Hatch and relayed  Yasha's message.  She said she will get in touch with her nurse and ask her to check the machine and her leg. Initial call taken by: Jacklynn Ganong,  May 06, 2009 2:20 PM

## 2010-02-17 NOTE — Progress Notes (Signed)
Summary: [facility] to discharge   Phone Note Other Incoming   Caller: [facility] Summary of Call: Victorino Dike, [facility] nurse, called to relay that patient is being discharged from home care Friday 04/11/09.  Patient is scheduled here for appt Thurs, 04/10/09.  Nurse direct ph # [phone]. Initial call taken by: Cammie Sickle,  April 08, 2009 3:56 PM

## 2010-02-17 NOTE — Miscellaneous (Signed)
Summary: Home Care Report  Home Care Report   Imported By: Elvera Maria 03/14/2009 10:25:16  _____________________________________________________________________  External Attachment:    Type:   Image     Comment:   advanced ot order

## 2010-02-17 NOTE — Assessment & Plan Note (Signed)
Summary: 2 WK RE-CK WOUND/POST OP/EVERCARE,MEDICAID/CAF   Visit Type:  Follow-up Referring Provider:  ap er Primary Provider:  Erle Crocker MD  CC:  recheck wound on ankle.  History of Present Illness:   DOS  1.12.2011  Procedure OTIF BIMALL LEFT ANKLE 01/29/09  Medication LORCET PLUS helps.  Meds, Lorcet plus, Xanax, Stool softener, Pravastatin, Ibuprofen 400 as needed, Amlodopine, Simvastatin, Gabapentin, Actonel, Vesicare, Pletal, Calcium.  Currently treated for wound which developed over the anterior ankle full thickness what appears to be a burn not sure if it came from the cast or if it came from rubbing in the cast.  She's also developed a plantar flexion contracture and the ankle which we will address after the wound has healed  Recommend wound VAC.  Dressing changes did help wound decreased in size improved in appearance large eschars tissue over the wound at this time may need debridement.        Allergies: 1)  Aspirin   Impression & Recommendations:  Problem # 1:  CLOSED BIMALLEOLAR FRACTURE (ICD-824.4) Assessment Comment Only  Orders: Home Health Referral (Home Health) Post-Op Check [id number])  Problem # 2:  AFTERCARE FOLLOW SURGERY MUSCULOSKEL SYSTEM NEC (ICD-V58.78) Assessment: Comment Only  Patient Instructions: 1)  Wound Vac everyday for right ankle 2)  Come back in 2 weeks recheck wound

## 2010-02-17 NOTE — Progress Notes (Signed)
Summary: wound vac is off  Phone Note Other Incoming Call back at cathy foster from [facility]   Summary of Call: says that they took wound vac off 05/06/09 due to pain without drainage, they applied wet to dry dressing and patient states that she does not want the wound vac back on, she has appt to see [place] 05/08/09, advised to keep vac off, wet to dry dressing ok Initial call taken by: Ether Griffins,  May 07, 2009 2:17 PM

## 2010-02-17 NOTE — Assessment & Plan Note (Signed)
Summary: 1 M RE-CK WOUND/POST OP 07/18/09/EVERCARE,MCD/CAF   Visit Type:  Follow-up Referring Joselyne Spake:  ap er Primary Dixon Luczak:  na   History of Present Illness: status post thickness skin graft and percutaneous Achilles tendon release after previous open treatment internal fixation of the right ankle  07/18/09 most recent surgery Percocet 5 for pain.  Today is recheck.  primarily has medial pain over the posterior tibial tendon, swelling in the foot is down, wound is healing nail.  Recommend continue dressing changes until the wound heals then addressed. A posterior tibial tendinitis and Achilles contracture.    Allergies: 1)  Aspirin   Other Orders: Post-Op Check (45409)  Patient Instructions: 1)  Please schedule a follow-up appointment in 1 month. Prescriptions: PERCOCET 5-325 MG TABS (OXYCODONE-ACETAMINOPHEN) 1 by mouth q 4 as needed pain  #84 x 0   Entered and Authorized by:   Fuller Canada MD   Signed by:   Fuller Canada MD on 09/18/2009   Method used:   Print then Give to Patient   RxID:   [id number]

## 2010-02-17 NOTE — Progress Notes (Signed)
Summary: wound leaking,having pain  Phone Note Call from Patient   Summary of Call: Alicia [name] (01/23/1940) called complaining of leaking from wound site and could not sleep last night due to pain. Advised her to come right into  the office to be checked, but she did not have transportation to get here before Dr. Romeo Apple had to leave to go to the hospital for surgery.  Told he if she got worse to go to the ER. Her # [phone] Initial call taken by: Jacklynn Ganong,  June 26, 2009 11:14 AM

## 2010-02-17 NOTE — Miscellaneous (Signed)
Summary: [facility] plan of care  [facility] plan of care   Imported By: Jacklynn Ganong 03/06/2009 07:57:04  _____________________________________________________________________  External Attachment:    Type:   Image     Comment:   External Document

## 2010-02-17 NOTE — Miscellaneous (Signed)
Summary: [facility] orders  [facility] orders   Imported By: Jacklynn Ganong 05/22/2009 10:20:41  _____________________________________________________________________  External Attachment:    Type:   Image     Comment:   External Document

## 2010-02-17 NOTE — Medication Information (Signed)
Summary: Order for wheelchair  Order for wheelchair   Imported By: Jacklynn Ganong 02/21/2009 09:18:58  _____________________________________________________________________  External Attachment:    Type:   Image     Comment:   External Document

## 2010-02-17 NOTE — Letter (Signed)
Summary: Internal Other  Internal Other   Imported By: Elvera Maria 03/12/2009 14:00:31  _____________________________________________________________________  External Attachment:    Type:   Image     Comment:   orders for ot [facility]

## 2010-02-17 NOTE — Letter (Signed)
Summary: Physician's orders for equipment  Physician's orders for equipment   Imported By: Jacklynn Ganong 03/25/2009 09:50:27  _____________________________________________________________________  External Attachment:    Type:   Image     Comment:   External Document

## 2010-02-17 NOTE — Assessment & Plan Note (Signed)
Summary: RE-CK/DRESSING CHG/POST OP 07/18/09/EVERCARE,MCD/CAF   Visit Type:  Follow-up, postop Referring Provider:  ap er Primary Provider:  na   History of Present Illness: Larene comes in today for recheck and have the dressing on her RIGHT ankle after a split thickness skin graft and a percutaneous release of her Achilles tendon  Seems to be doing well at this point graft looks good.  I did not have any Adaptic to change the dressings were put on a Betadine dressing she will get that change tomorrow should be okay for one day  She's going to see Dr. Malvin Johns on Monday he will call me to let me know and I need to do another dressing change  When it is okay as far as the split thickness skin graft does we can place her in a Cam Walker and let her weight-bear as tolerated and start physical therapy  Allergies: 1)  Aspirin   Impression & Recommendations:  Problem # 1:  AFTERCARE FOLLOW SURGERY SKIN&SUBCUT TISSUE NEC (ICD-V58.77)  Orders: Post-Op Check (16109)  Problem # 2:  DISRUPTION OF INTERNAL OPERATION SURGICAL WOUND (ICD-998.31)  Orders: Post-Op Check (60454)  Problem # 3:  CLOSED BIMALLEOLAR FRACTURE (ICD-824.4)  Orders: Post-Op Check (09811)  Patient Instructions: 1)  followup Monday with Dr. Malvin Johns and then followup with me by phone call

## 2010-02-17 NOTE — Progress Notes (Signed)
Summary: Alicia [name] to see Dr. Malvin Johns this Friday  Phone Note Other Incoming   Summary of Call: Dr. Malvin Johns said to tell you that he will see Alicia [name] in his office this Friday,07/25/09.  I gave her this message and she is going to call his office for a time to come in. Initial call taken by: Jacklynn Ganong,  July 23, 2009 3:47 PM

## 2010-02-17 NOTE — Assessment & Plan Note (Signed)
Summary: Wrist pain/LBP   Vital Signs:  Patient Profile:   70 Years Old Female Height:     61.5 inches O2 Sat:      97 % O2 treatment:    Room Air Pulse rate:   89 / minute Resp:     8 per minute BP sitting:   136 / 72  (right arm)  Vitals Entered By: Lutricia Horsfall (February 01, 2007 9:05 AM)                 Procedure Note  Injections: The patient complains of pain. Consent signed: yes  Procedure # 1: joint injection    Region: lateral    Location: right wrist    Comment: after written informed consent was obtained, the area was cleaned with chlorprep in the usual sterile fashion.  2% lidocaine with epinephrine was used for local anesthetic.  1/2 cc depomedrol 80mg /ml with 1/2 cc lidocaine were injected without difficult. The patient tolerated the procedure and there were no immediate complications.    Chief Complaint:  followup HTN and joint pain.  History of Present Illness: Here for routine follow up.  Her right wrist is still hurting and the IM steroids did not help much.  She says that over the Christmas and New Years holiday she had back pain and pain down her left leg.  She got some Doan's pills and it helped a bit but she still has some back and leg pain.  She says she didn't care about the stomach pain from the Doan's pills because she needed the pain relief in her back and leg.  She says the pain had been progressing and there was no preceeding injury.    Current Allergies: ASPIRIN  Past Medical History:    Reviewed history from 01/02/2007 and no changes required:       Allergic rhinitis       Anxiety       Depression       Hyperlipidemia       Hypertension       Low back pain       Peptic ulcer disease       IBS with constipation       cataracts       broken knee       migraines       Osteoarthritis       DeQuervains Tenosynovitis--recurrent      Physical Exam  General:     alert and underweight appearing.   Msk:     + ttp over right  lateral wrist.    Impression & Recommendations:  Problem # 1:  DE QUERVAIN'S TENOSYNOVITIS (ICD-727.04) Joint injection done. Orders: Joint Aspirate / Injection, Intermediate (20605)   Problem # 2:  LOW BACK PAIN (ICD-724.2) With the leg pain and back pain, we are going to get an MRI for further evaluation. Her updated medication list for this problem includes:    Ultracet 37.5-325 Mg Tabs (Tramadol-acetaminophen) .Marland Kitchen... 1 by mouth 4 times per day  Orders: MRI (MRI)   Complete Medication List: 1)  Amlodipine Besylate 10 Mg Tabs (Amlodipine besylate) .Marland Kitchen.. 1 by mouth once daily 2)  Ultracet 37.5-325 Mg Tabs (Tramadol-acetaminophen) .Marland Kitchen.. 1 by mouth 4 times per day     ]  Appended Document: Orders Update    Clinical Lists Changes  Orders: Added new Service order of Depo- Medrol 40mg  (J1030) - Signed

## 2010-02-17 NOTE — Assessment & Plan Note (Signed)
Summary: 1 WK WOUND CHECK/LT ANKLE/EVERCARE,MEDICAID/CAF   Visit Type:  Follow-up Referring Provider:  ap er Primary Provider:  Erle Crocker MD  CC:  recheck left ankle wound.  History of Present Illness: DOS  1.12.2011  Procedure OTIF BIMALL LEFT ANKLE 01/29/09  Medication Norco 10 helps.  Meds, Norco 10, Xanax, Stool softener, Pravastatin, Ibuprofen 400 as needed, Amlodopine, Simvastatin, Gabapentin, Actonel, Vesicare, Pletal, Calcium, Bactrim DS.  Currently treated for wound which developed over the anterior ankle full thickness what appears to be a burn not sure if it came from the cast or if it came from rubbing in the cast.  She's also developed a plantar flexion contracture and the ankle which we will address after the wound has healed  Today is recheck after wet to dry dressings.  further debridement of the ankle of necrotic tissue.  Return one week for repeat debridement.  Right now we have a 7 x 5 cm anterior lateral ankle wound with necrotic tissue and necrotic portions of the anterior tibialis, which I debrided again today.  Probably, will need an Achilles tendon lengthening, plus or minus skin graft    Allergies: 1)  Aspirin   Other Orders: Post-Op Check (14782)  Patient Instructions: 1)  Please schedule a follow-up appointment in 1 week.

## 2010-02-17 NOTE — Assessment & Plan Note (Signed)
Summary: weight loss   Vital Signs:  Patient Profile:   70 Years Old Female Height:     61.5 inches Weight:      94 pounds BMI:     17.54 O2 Sat:      97 % Pulse rate:   77 / minute Resp:     8 per minute BP sitting:   126 / 82  (left arm)  Vitals Entered By: Lutricia Horsfall (August 26, 2006 9:12 AM) Oxygen therapy Room Air               Chief Complaint:  followup.  History of Present Illness:  Ms. Freeburg presents for follow-up.  She says she is feeling better.  The injection we gave her for her wrist has cleared up the pain.  Two days ago she started the nicotine patch to stop smoking.  Since her last appointment she did have her chest x-ray which was followed by a chest CT which showed no masses.  She also had a mammogram which was normal.  She still says she thinks her last colonoscopy was two or 3 years ago at any 10, but I cannot find this results in E-chart.  Current Allergies: ASPIRIN  Past Medical History:    Reviewed history from 06/07/2006 and no changes required:       Allergic rhinitis       Anxiety       Depression       Hyperlipidemia       Hypertension       Low back pain       Peptic ulcer disease       IBS with constipation       cataracts       arthritis       broken knee       migraines    Risk Factors:  Mammogram History:     Date of Last Mammogram:  08/04/2006    Results:  normal     Physical Exam  General:     alert and underweight appearing.      Impression & Recommendations:  Problem # 1:  WEIGHT LOSS, ABNORMAL (ICD-783.21) she has lost an additional pound.  Her labs looked surprisingly good.  We do need to see when her last colonoscopy wastime as it appears to have been more than two to 3 years ago if I can't find it in E-chart.  We will contact [facility] to see if they have a copy.  Problem # 2:  HYPERTENSION (ICD-401.9) her blood pressure looks good today. Her updated medication list for this problem includes:  Amlodipine Besylate 10 Mg Tabs (Amlodipine besylate) .Marland Kitchen... 1 by mouth once daily   Complete Medication List: 1)  Amlodipine Besylate 10 Mg Tabs (Amlodipine besylate) .Marland Kitchen.. 1 by mouth once daily 2)  Ultracet 37.5-325 Mg Tabs (Tramadol-acetaminophen) .Marland Kitchen.. 1 by mouth 4 times per day            Preventive Care Screening  Mammogram:    Date:  08/04/2006    Next Due:  08/2007    Results:  normal

## 2010-02-17 NOTE — Miscellaneous (Signed)
Summary: Home Care Report Dischg summary  Home Care Report   Imported By: Cammie Sickle 06/19/2009 14:32:04  _____________________________________________________________________  External Attachment:    Type:   Image     Comment:   External Document

## 2010-02-17 NOTE — Assessment & Plan Note (Signed)
Summary: POST OP 1/ RT FOOT/ANKLE SURG 07/18/09/SEC HORIZ,MCD/CAF   Visit Type:  Follow-up Referring Provider:  ap er Primary Provider:  na  CC:  post op 1.  History of Present Illness: I saw Alicia [name] in the office today for a followup visit.  She is a 70 years old woman with the complaint of:  post op 1 split thickness skin graft, debridement of necrotic tissue and skin superficial and Percutaneous Achilles tendon release.  Dr. Malvin Johns did the STSG  Post op 1 today.  POD 5 for dressing change.  Dr. Malvin Johns change the dressing yesterday, I changes today and we'll change it tomorrow everything looks great she is placed back in a posterior splint with the foot in neutral position    Allergies: 1)  Aspirin   Impression & Recommendations:  Problem # 1:  DISRUPTION OF INTERNAL OPERATION SURGICAL WOUND (ICD-998.31)  Orders: Post-Op Check (52841)  Problem # 2:  CLOSED BIMALLEOLAR FRACTURE (ICD-824.4)  Orders: Post-Op Check (32440)  Problem # 3:  AFTERCARE FOLLOW SURGERY MUSCULOSKEL SYSTEM NEC (ICD-V58.78)  Orders: Post-Op Check (10272)  Problem # 4:  AFTERCARE FOLLOW SURGERY SKIN&SUBCUT TISSUE NEC (ICD-V58.77)  Patient Instructions: 1)  Thursday recheck dressing change

## 2010-02-17 NOTE — Miscellaneous (Signed)
Summary: [facility] Plan of treatment  [facility] Plan of treatment   Imported By: Jacklynn Ganong 05/06/2009 09:07:53  _____________________________________________________________________  External Attachment:    Type:   Image     Comment:   External Document

## 2010-02-17 NOTE — Assessment & Plan Note (Signed)
Summary: 2 WK RE-CK WOUND RT ANKLE/EVERCARE,MEDICAID/CAF   Visit Type:  Follow-up Referring Provider:  ap er Primary Provider:  Erle Crocker MD  CC:  recheck wound on ankle.  History of Present Illness: DOS  1.12.2011  Procedure OTIF BIMALL LEFT ANKLE 01/29/09  Medication: For the ankle, Percocet 5 mg, Bactrim DS.  Meds, Percocet 5, Xanax, Stool softener, Pravastatin, Ibuprofen 400 as needed, Amlodopine, Simvastatin, Gabapentin, Actonel, Vesicare, Pletal, Calcium, Bactrim DS.  complains of pain in her foot near the toes as well as pain in the medial side of the ankle.  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 1 By Mouth Once Daily 2)  Pletal 100 Mg Tabs (Cilostazol) .Marland Kitchen.. 1 By Mouth Two Times A Day 3)  Naproxen 500 Mg Tabs (Naproxen) .Marland Kitchen.. 1 By Mouth Two Times A Day For Shoulder Pain 4)  Pravastatin Sodium 40 Mg Tabs (Pravastatin Sodium) .... Take One Tab Once Daily 5)  Vesicare 10 Mg Tabs (Solifenacin Succinate) .... Take 1 Tablet By Mouth Once A Day 6)  Gabapentin 300 Mg Caps (Gabapentin) .... Take 1 Tablet By Mouth Once A Day 7)  Lyrica 50 Mg Caps (Pregabalin) 8)  Bactrim Ds 800-160 Mg Tabs (Sulfamethoxazole-Trimethoprim) .Marland Kitchen.. 1 By Mouth Two Times A Day 9)  Hydrocodone-Acetaminophen 10-325 Mg Tabs (Hydrocodone-Acetaminophen) .Marland Kitchen.. 1 -2 By Mouth Q 4 As Needed Pain 10)  Percocet 5-325 Mg Tabs (Oxycodone-Acetaminophen) .Marland Kitchen.. 1 By Mouth Q 4 As Needed Pain 11)  Gabapentin 100 Mg Caps (Gabapentin) .Marland Kitchen.. 1 By Mouth Tid  Allergies (verified): 1)  Aspirin  Past History:  Past Medical History: Last updated: 11-01-2008 Allergic rhinitis Anxiety Depression Hyperlipidemia Hypertension Low back pain Peptic ulcer disease IBS with constipation cataracts broken knee migraines Osteoarthritis DeQuervains  Tenosynovitis--recurrent PVD right rotator cuff tear  Past Surgical History: Last updated: 04/09/2008 breast tumor back tumor R inguinal herniorraphy R femur fracture repair Right femoral-to-above the knee popliteal arterial bypass  Family History: Last updated: 05-10-2007 father-deceased--in house fire mother-deceased-HTN, CVA  Social History: Last updated: 2008/11/01 Single lives alone--SO died 26-Jun-2004 Current Smoker-- used to smoke 3-4 ppd, now pack lasts 1 week Alcohol use-no--quit for 1 year Drug use-no  Risk Factors: Smoking Status: current (06/07/2006)  Review of Systems Neurologic:  See HPI. Musculoskeletal:  See HPI.  The review of systems is negative for Constitutional, Cardiovascular, Respiratory, Gastrointestinal, Genitourinary, Endocrine, Psychiatric, Skin, HEENT, Immunology, and Hemoatologic.  Physical Exam  Skin:  6 X 3 CM WITH GRANULATION TISSUE over the dorsal medial ankle.  There is a lateral wound which is normal Inguinal Nodes:  no significant adenopathy Psych:  alert and cooperative; normal mood and affect; normal attention span and concentration   Foot/Ankle Exam  General:    Well-developed, well-nourished ,small body habitus; no deformities, normal grooming.    Gait:    ambulates with crutches minimal weight bearing  Inspection:    deformity: ankle plantar flexion contracture deformity  Palpation:    tenderness in the plantar aspect of the foot as well as the medial posterior tibial tendon area and posterior medial leg  Vascular:    normal capillary refill and adequate dorsalis pedis pulses  Sensory:    reports normal sensation to soft touch obviously response to pain denies tingling  Motor:    plantar flexion contracture  Ankle Exam:    Right:  Inspection:  Abnormal    Palpation:  Abnormal    Stability:  stable     The upper extremities have normal appearance, ROM, strength and stability.  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 Patient Level III (11914) Ankle x-ray complete,  minimum 3 views (78295) Foot x-ray complete, minimum 3 views (62130)  Problem # 2:  DISRUPTION OF INTERNAL OPERATION SURGICAL WOUND (ICD-998.31)  Orders: Est. Patient Level III (86578) Ankle x-ray complete,  minimum 3 views (46962) Foot x-ray complete, minimum 3 views (95284)  Problem # 3:  CLOSED BIMALLEOLAR FRACTURE (ICD-824.4)  Orders: Est. Patient Level III (13244) Ankle x-ray complete,  minimum 3 views (01027) Foot x-ray complete, minimum 3 views (25366)  Patient Instructions: 1)  scheduled for [place] with DR BRADFORD TO ASSIST SKIN GRAFT POSSIBLE PERCUTANEOUS ACHILLES RELEASE  2)  INCREASE THE GABAPENTIN TO 2 TABS three times a day

## 2010-02-17 NOTE — Miscellaneous (Signed)
Summary: [facility] orders  [facility] orders   Imported By: Jacklynn Ganong 04/23/2009 15:42:39  _____________________________________________________________________  External Attachment:    Type:   Image     Comment:   External Document

## 2010-02-17 NOTE — Progress Notes (Signed)
Summary: patient saw Dr. Malvin Johns today  Phone Note Call from Patient   Caller: Patient Summary of Call: Patient called to notify as requested - she saw Dr. Malvin Johns today, 07/31/09 and does not have to return there till next Thurs, 08/07/09.  States she told Dr.Bradford she has changed her own dressings in the past.  When is she to return here?  [place] Spaniel hopes "not as soon as tomorrow." Please advise, ph [phone]. Initial call taken by: Cammie Sickle,  July 31, 2009 2:16 PM  Follow-up for Phone Call        thursday is ok here as well Follow-up by: Fuller Canada MD,  August 04, 2009 8:34 AM  Additional Follow-up for Phone Call Additional follow up Details #1::        Called patient for appointment. Left voice mail msg. Patient returned call, advised, scheduling for Thurs. Additional Follow-up by: Cammie Sickle,  August 04, 2009 10:12 AM

## 2010-02-17 NOTE — Assessment & Plan Note (Signed)
Summary: RE-CK WOUND/POST OP 07/18/09/EVERCARE,MCD/CAF   Visit Type:  Follow-up Referring Provider:  ap er Primary Provider:  na  CC:  wound check post op.  History of Present Illness: status post thickness skin graft and the cutaneous Achilles tendon release after previous open treatment internal fixation of the right ankle 07/18/09 DOS.  Percocet 5 for pain.  Has some bleeding, minimal.  Has alot of itching.  The wound is healing nicely it's just about granulating completely it's probably about 4 or 5 cm long by about a centimeter and a half looks good  She is complaining of some itching over the proximal donor site it looks fine.  She does have a lot of swelling in her foot and ankle and more pain than she had last time so as to stop the exercise program.  She thinks that maybe she's been walking on it too much.  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 One by mouth q 4-6 hrs as needed for itching  Patient Instructions: 1)  Please schedule a follow-up appointment in 1 month. 2)  continue dressing changes  3)  See Dr Malvin Johns as he has ordered  Prescriptions: BENADRYL 25 MG CAPS (DIPHENHYDRAMINE HCL) one by mouth q 4-6 hrs as needed for itching  #60 x 1   Entered and Authorized by:   Fuller Canada MD   Signed by:   Fuller Canada MD on 08/20/2009   Method used:   Faxed to ...       [facility]       [address]       [place], [place]  [address]       Ph: [phone]       Fax: [phone]   RxID:   [id number] BACTRIM DS 800-160 MG TABS (SULFAMETHOXAZOLE-TRIMETHOPRIM) 1 by mouth two times a day  #60 x 0   Entered and Authorized by:   Fuller Canada MD   Signed by:   Fuller Canada MD on 08/20/2009   Method used:   Faxed to ...       [facility]       [address]       [place], [place]  [address]       Ph: [phone]       Fax: [phone]   RxID:   [id number]

## 2010-02-17 NOTE — Miscellaneous (Signed)
Summary: [facility] orders  [facility] orders   Imported By: Jacklynn Ganong 05/28/2009 15:26:41  _____________________________________________________________________  External Attachment:    Type:   Image     Comment:   External Document

## 2010-02-17 NOTE — Assessment & Plan Note (Signed)
Summary: RECK WOUND/EVERCARE/BSF   Visit Type:  Follow-up Referring Provider:  ap er Primary Provider:  na  CC:  right foot ankle.  History of Present Illness: Alicia [name] comes in today for recheck and have the dressing on her RIGHT ankle after a split thickness skin graft and a percutaneous release of her Achilles tendon  07/18/09 DOS.  POD 12  Seems to be doing well at this point graft looks good.  everything looking good she is exercising the foot she maintained her neutral dorsiflexed ankle followup to be arranged after Dr. breast disease the patient      Allergies: 1)  Aspirin   Impression & Recommendations:  Problem # 1:  AFTERCARE FOLLOW SURGERY SKIN&SUBCUT TISSUE NEC (ICD-V58.77) Assessment Improved  Orders: Post-Op Check (16109)  Patient Instructions: 1)  to be determined

## 2010-02-17 NOTE — Miscellaneous (Signed)
Summary: mri with contrast 12/31/09 230pm come back for results  Clinical Lists Changes   precert for [facility] [phone] exp 02/09/10 no precert needed for medicaid, advised pt of appt, to come back in for results, she is to get lab work for [employer] either today or [employer]

## 2010-02-17 NOTE — Letter (Signed)
Summary: surgery order RT foot  surgery order RT foot   Imported By: Cammie Sickle 07/04/2009 10:29:12  _____________________________________________________________________  External Attachment:    Type:   Image     Comment:   External Document

## 2010-02-17 NOTE — Assessment & Plan Note (Signed)
Summary: 1 WK RE-CK/XRAY/POST OP/EVERCARE,MEDICAID/CAF   Visit Type:  Follow-up Referring Provider:  ap er Primary Provider:  Erle Crocker MD  CC:  right ankle post op fracture.  History of Present Illness: DOS  1.12.2011  Procedure OTIF BIMALL LEFT ANKLE 01/29/09  Medication LORCET PLUS    she has a problem with the skin overlying the anteromedial ankle joint with what looks  benign or a cast rubbing problem.  She has lost a superficial layer of epidermis.  A Betadine impregnated Xeroform dressing is applied with a soft Ace wrap and Aircast.    Meds: Actonel, Stool softener, Simvastatin, Enablex, Sanctura, Pletal.  Return2 week for wound check.    3 views left ankle ordered   normal appearance hardware left ankel mortise is reduced and fracture has healed   Allergies: 1)  Aspirin   Impression & Recommendations:  Problem # 1:  CLOSED BIMALLEOLAR FRACTURE (ICD-824.4)  xrays  fracture healing   Orders: Post-Op Check (10272) Ankle x-ray complete,  minimum 3 views (53664)  Problem # 2:  AFTERCARE FOLLOW SURGERY MUSCULOSKEL SYSTEM NEC (ICD-V58.78)  Orders: Post-Op Check (40347) Ankle x-ray complete,  minimum 3 views (42595)  Patient Instructions: 1)  2 weeks wound check

## 2010-02-17 NOTE — Miscellaneous (Signed)
Summary: Rehab Report  Rehab Report   Imported By: Elvera Maria 03/12/2009 13:59:32  _____________________________________________________________________  External Attachment:    Type:   Image     Comment:   [facility] ot report

## 2010-02-17 NOTE — Miscellaneous (Signed)
Summary: Home Care order  Home Care order   Imported By: Cammie Sickle 05/17/2009 10:27:42  _____________________________________________________________________  External Attachment:    Type:   Image     Comment:   External Document

## 2010-02-17 NOTE — Miscellaneous (Signed)
  Clinical Lists Changes  Medications: Rx of PERCOCET 5-325 MG TABS (OXYCODONE-ACETAMINOPHEN) 1 by mouth q 4 as needed pain;  #84 x 0;  Signed;  Entered by: Fuller Canada MD;  Authorized by: Fuller Canada MD;  Method used: Print then Give to Patient    Prescriptions: PERCOCET 5-325 MG TABS (OXYCODONE-ACETAMINOPHEN) 1 by mouth q 4 as needed pain  #84 x 0   Entered and Authorized by:   Fuller Canada MD   Signed by:   Fuller Canada MD on 08/13/2009   Method used:   Print then Give to Patient   RxID:   [id number]

## 2010-02-17 NOTE — Assessment & Plan Note (Signed)
Summary: 2 WEEKS RECK WOUND RT ANKLE/EVERCARE/MEDICAID/BSF   Visit Type:  Follow-up Referring Provider:  ap er Primary Provider:  Erle Crocker MD  CC:  recheck rt ankle wound.  History of Present Illness: DOS  1.12.2011  Procedure OTIF BIMALL LEFT ANKLE 01/29/09  Medication Norco 10 helps, does not help much anymore, out of ATBS for 2 days.  Meds, Norco 10, Xanax, Stool softener, Pravastatin, Ibuprofen 400 as needed, Amlodopine, Simvastatin, Gabapentin, Actonel, Vesicare, Pletal, Calcium, Bactrim DS.  Today is a 2 week recheck after wet to dry dressings.  Patient on crutches today.  The wound is shrinking and the granulation tissue is excellent.  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 Patient Level II (07371)  Medications Added to Medication List This Visit: 1)  Percocet 5-325 Mg Tabs (Oxycodone-acetaminophen) .Marland Kitchen.. 1 by mouth q 4 as needed pain 2)  Gabapentin 100 Mg Caps (Gabapentin) .Marland Kitchen.. 1 by mouth tid  Patient Instructions: 1)  Continue wet to dry dressings  2)  return 2 weeks  3)  You have 2 new meds for pain start today  Prescriptions: PERCOCET 5-325 MG TABS (OXYCODONE-ACETAMINOPHEN) 1 by mouth q 4 as needed pain  #84 x 0   Entered and Authorized by:   Fuller Canada MD   Signed by:   Fuller Canada MD on 06/05/2009   Method used:   Print then Give to Patient   RxID:   [id number] GABAPENTIN 100 MG CAPS (GABAPENTIN) 1 by mouth tid  #60 x 5   Entered and Authorized by:   Fuller Canada MD   Signed by:   Fuller Canada MD on 06/05/2009   Method used:   Print then Give to Patient   RxID:   [id number] PERCOCET 5-325 MG TABS (OXYCODONE-ACETAMINOPHEN) 1 by mouth q 4 as needed pain  #84 x 0   Entered and Authorized by:   Fuller Canada MD  Signed by:   Fuller Canada MD on 06/05/2009   Method used:   Handwritten   RxID:   [id number]

## 2010-02-17 NOTE — Progress Notes (Signed)
Summary: call to patient Rx   Phone Note Outgoing Call   Call placed to: Patient Summary of Call: called patient re: Rx pick up. Left message on ans machine to return call Initial call taken by: Cammie Sickle,  August 13, 2009 2:14 PM

## 2010-02-17 NOTE — Progress Notes (Signed)
Summary: needs pain prescription,having more pain   Phone Note Call from Patient   Summary of Call: Alicia [name] needs a new prescription for Oxycodone.  She said she is having more pain than usual since yesterday. [phone] Initial call taken by: Jacklynn Ganong,  August 13, 2009 1:30 PM

## 2010-02-17 NOTE — Assessment & Plan Note (Signed)
Summary: 2 WK WOUND RE-CK/POST OP+2 M RE-CK SHOULDER,POST OP/EVERCARE/...   Visit Type:  Follow-up Referring Provider:  ap er Primary Provider:  Erle Crocker MD   History of Present Illness: I saw Alicia [name] in the office today for a 2 week  followup visit.  She is a 70 years old woman with the complaint of:  wound check  DOS  1.12.2011  Procedure OTIF BIMALL LEFT ANKLE 01/29/09  Medication LORCET PLUS  the size of the wound is decreasing  the foot continues in Plantar flexion  redressed   home health   2 wk f/u    Allergies: 1)  Aspirin   Impression & Recommendations:  Problem # 1:  CLOSED BIMALLEOLAR FRACTURE (ICD-824.4) Assessment Comment Only  Orders: Home Health Referral (Home Health) Post-Op Check [phone])  Problem # 2:  AFTERCARE FOLLOW SURGERY MUSCULOSKEL SYSTEM NEC (ICD-V58.78) Assessment: Comment Only  Orders: Home Health Referral (Home Health) Post-Op Check [phone])  Patient Instructions: 1)  Home health dressing changes 3 x a week  2)  return in 2 weeks

## 2010-02-17 NOTE — Assessment & Plan Note (Signed)
Summary: 2 WEEKS FOR CAST CHANGE/EVERCARE/MEDICAID/BSF   Visit Type:  Follow-up Referring Provider:  ap er Primary Provider:  Erle Crocker MD  CC:  post op ankle.  History of Present Illness: I saw Alicia [name] in the office today for a followup visit.  She is a 70 years old woman with the complaint of:  DOS OTIF BIMALL LEFT ANKLE 01/29/09.  Medication LORCET PLUS  Today here for cast change.  he has a problem with the skin overlying the anteromedial ankle joint with what looks like a benign or a cast rubbing problem.  She has lost a superficial layer of epidermis.  A Betadine impregnated Xeroform dressing is applied with a soft Ace wrap and Aircast.   New Meds: Actonel, Stool softener, Simvastatin, Enablex, Sanctura, Pletal.  Return one week for wound check. An x-ray  Allergies: 1)  Aspirin   Impression & Recommendations:  Problem # 1:  CLOSED BIMALLEOLAR FRACTURE (ICD-824.4)  Orders: Post-Op Check (60454)  Patient Instructions: 1)  1 week xrays and dressing

## 2010-02-17 NOTE — Assessment & Plan Note (Signed)
Summary: 2 WK RE-CK WOUND/LT ANKLE/EVERCARE,MEDICAID/CAF   Visit Type:  Follow-up Referring Provider:  ap er Primary Provider:  Erle Crocker MD  CC:  wound check on left ankle.  History of Present Illness: DOS  1.12.2011  Procedure OTIF BIMALL LEFT ANKLE 01/29/09  Medication LORCET PLUS helps.  Meds, Lorcet plus, Xanax, Stool softener, Pravastatin, Ibuprofen 400 as needed, Amlodopine, Simvastatin, Gabapentin, Actonel, Vesicare, Pletal, Calcium, Bactrim DS.  Currently treated for wound which developed over the anterior ankle full thickness what appears to be a burn not sure if it came from the cast or if it came from rubbing in the cast.  She's also developed a plantar flexion contracture and the ankle which we will address after the wound has healed  We had to remove the wound VAC because of persistent pain.    The wound was good around the edges and in the center 2 areas of necrotic tissue I removed most of that with gentle debridement part of the tibialis anterior tendon however is exposed and that will have to be debrided later and then a transfer will probably be needed and can use the extensor digitorum to the medial cuneiform and she will also need a Achilles tendon lengthening    Allergies: 1)  Aspirin   Other Orders: Post-Op Check (93235)  Patient Instructions: 1)  come back in a week 2)  wet to dry dressings only

## 2010-02-17 NOTE — Letter (Signed)
Summary: Hospital prog note  Hospital prog note   Imported By: Cammie Sickle 05/23/2009 12:38:16  _____________________________________________________________________  External Attachment:    Type:   Image     Comment:   External Document

## 2010-02-17 NOTE — Miscellaneous (Signed)
Summary: [facility] orders  [facility] orders   Imported By: Jacklynn Ganong 05/13/2009 11:22:55  _____________________________________________________________________  External Attachment:    Type:   Image     Comment:   External Document

## 2010-02-17 NOTE — Miscellaneous (Signed)
Summary: OT Discharge   OT Discharge   Imported By: Jacklynn Ganong 03/07/2009 10:26:39  _____________________________________________________________________  External Attachment:    Type:   Image     Comment:   External Document

## 2010-02-17 NOTE — Letter (Signed)
Summary: MRI Appt.  [facility]  [address]   [place], [place] [address]   Phone: [phone]  Fax: [phone]    07/20/2007 MRN: [id number]  [address] [place], [place]  [address]  Dear Ms. Demuro,  I have attempted to contact you several times and have been unsuccessful.  Per Dr. Jen Mow request I have scheduled you for an MRI of the abdomen at [facility] on 07/31/07 at 10:00am.  You need to arrive 30 minutes early to register.  You need to be fasting for 4 hours prior to the test.  You also need to go to [facility] lab on 07/28/07 to have bloodwork done that is required prior to the MRI.  Enclosed you will find the order to take to [facility].  If you need to reschedule this appointment please call Radiology at [phone].           Sincerely,   [facility]

## 2010-02-17 NOTE — Letter (Signed)
Summary: surgery order LT shoulder sched 10/25/08  surgery order LT shoulder sched 10/25/08   Imported By: Cammie Sickle 05/17/2009 10:26:47  _____________________________________________________________________  External Attachment:    Type:   Image     Comment:   External Document

## 2010-02-17 NOTE — Assessment & Plan Note (Signed)
Summary: 1 WK RE-CK WOUND/LT ANKLE/EVERCARE/MEDICAID/CAF   Visit Type:  Follow-up Referring Provider:  ap er Primary Provider:  Erle Crocker MD  CC:  recheck wound.  History of Present Illness: DOS  1.12.2011  Procedure OTIF BIMALL LEFT ANKLE 01/29/09  Medication Norco 10 helps.  Meds, Norco 10, Xanax, Stool softener, Pravastatin, Ibuprofen 400 as needed, Amlodopine, Simvastatin, Gabapentin, Actonel, Vesicare, Pletal, Calcium, Bactrim DS.  continues to improve with wet-to-dry dressings.  It is getting smaller.  There were no necrotic areas today.  Everything looked very healthy in terms of color, bleeding.  Continue wet-to-dry dressings I still think we will need to do a skin graft and Achilles tendon release.    Allergies: 1)  Aspirin   Impression & Recommendations:  Problem # 1:  CLOSED BIMALLEOLAR FRACTURE (ICD-824.4) Assessment Improved  Orders: Est. Patient Level II (04540)  Problem # 2:  AFTERCARE FOLLOW SURGERY MUSCULOSKEL SYSTEM NEC (ICD-V58.78) Assessment: Improved  Orders: Est. Patient Level II (98119)  Problem # 3:  DISRUPTION OF INTERNAL OPERATION SURGICAL WOUND (ICD-998.31) Assessment: Comment Only  skin necrosis  Orders: Est. Patient Level II (14782)  Patient Instructions: 1)  return in 2 weeks

## 2010-02-17 NOTE — Miscellaneous (Signed)
Summary: [facility] order  [facility] order   Imported By: Jacklynn Ganong 05/06/2009 09:06:56  _____________________________________________________________________  External Attachment:    Type:   Image     Comment:   External Document

## 2010-02-17 NOTE — Miscellaneous (Signed)
Summary: [facility] PT note  [facility] PT note   Imported By: Jacklynn Ganong 02/28/2009 10:53:01  _____________________________________________________________________  External Attachment:    Type:   Image     Comment:   External Document

## 2010-02-17 NOTE — Assessment & Plan Note (Signed)
Summary: RECK WOUND/BSF   Visit Type:  Follow-up Referring Provider:  ap er Primary Provider:  na  CC:  post op.  History of Present Illness: Alicia [name] comes in today for recheck and have the dressing on her RIGHT ankle after a split thickness skin graft and a percutaneous release of her Achilles tendon  07/18/09 DOS.  POD 11.  Seems to be doing well at this point graft looks good.  I did not have any Adaptic to change the dressings were put on a Betadine dressing she will get that change tomorrow should be okay for one day  When it is okay as far as the split thickness skin graft does we can place her in a Cam Walker and let her weight-bear as tolerated and start physical therapy  Still pain, says her dressing is heavy with the plaster.      Allergies: 1)  Aspirin  Physical Exam  Extremities:  ROM 0-15 PF  Skin:  skin gracft looks good    Other Orders: Post-Op Check (10272)  Patient Instructions: 1)  tomorrow any time convenient  2)  dressing change

## 2010-02-17 NOTE — Assessment & Plan Note (Signed)
Summary: POST OP 1/RT ANKLE SURG 01/29/09/POSS XR'S/UHC,MEDICAID/CAF   Visit Type:  Follow-up  CC:  POST OP VISIT .  History of Present Illness: DOS JAN 12  Procedure OTIF BIMALL LEFT ANKLE  Medication LORCET  Subjectives MODERATE PAIN   LATERAL STAPLES OUT TODAY  MIN SWELLING MEDIAL SIDE STITCHES LEFT IN   XRAYS 3 V ANKLE  HARDWAR INTACT, REDUCTION MAINTAINED   A: DOING WELL  P: STAY NWB, RET 1 WK, TAKE OUT STITCHES AND PLACE SLC    Allergies: 1)  Aspirin   Impression & Recommendations:  Problem # 1:  AFTERCARE FOLLOW SURGERY MUSCULOSKEL SYSTEM NEC (ICD-V58.78) Assessment Comment Only  Orders: Post-Op Check (16109) Ankle x-ray complete,  minimum 3 views (60454) Short Leg Splint (09811)  Problem # 2:  CLOSED BIMALLEOLAR FRACTURE (ICD-824.4) Assessment: Comment Only  Orders: Post-Op Check (91478) Ankle x-ray complete,  minimum 3 views (29562) Short Leg Splint (13086)  Patient Instructions: 1)  Please schedule a follow-up appointment in 1 week. 2)  take out stitches  3)  apply Short Leg Cast

## 2010-02-17 NOTE — Miscellaneous (Signed)
Summary: Home Care Report  Home Care Report   Imported By: Elvera Maria 03/14/2009 10:26:00  _____________________________________________________________________  External Attachment:    Type:   Image     Comment:   advanced ot order

## 2010-02-17 NOTE — Assessment & Plan Note (Signed)
Summary: new patient/arc   Vital Signs:  Patient Profile:   70 Years Old Female Height:     61.5 inches Weight:      99 pounds BMI:     18.47 O2 Sat:      97 % Pulse rate:   70 / minute Resp:     8 per minute BP sitting:   110 / 80  (right arm)  Pt. in pain?   no  Vitals Entered By: Lutricia Horsfall (Jun 07, 2006 4:05 PM) Oxygen therapy Room Air                Chief Complaint:  new patient-HTN.  History of Present Illness: Alicia [name] is a 70 year old woman who presented today to establish her care.  She says she is at high blood pressure all of her life but has only been on blood pressure medicines intermittently.  She has not had a primary care doctor in quite awhile and was going to the free clinic until she turned 70 in September.  Since then she has only been to the emergency room for medical care.  she was last seen in the emergency room on the 28th of April for right wrist pain.  Her blood pressure at that was found to be 217 over 122 and she was given a one-month prescription for clonidine.  she says that she ran out of it Sunday but was given a pill for yesterday by the pharmacy until she could get here today.  on the clonidine she has noted increased fatigue and dizziness.  She is also noting about 4 weeks of trouble with her right wrist.  she got a wrist splint from the emergency department but this has not helped.  She notes that she has trouble with her right leg also is having worsening pain over the past months to years.  In 06/07/1987 she had a femur fracture and had it surgically repaired with a steel rod.  She did not have substantial pain previously but does note that the pain is worsening especially in her knee.  She says she also has a lot of trouble with her bowels she says she'll have vomiting and diarrhea at the same time.  She does know she had a colonoscopy but doesn't know exactly when but thinks it might have been about 5 years ago.  She also notes that she's  been told she has an infection in her left ear.  She had been on antibiotics none of which cleared it.  She said when she was going to the free clinic they were going to refer her to ENT, but she got her Medicare and no one ever got back with her.  Current Allergies: ASPIRIN  Past Medical History:    Allergic rhinitis    Anxiety    Depression    Hyperlipidemia    Hypertension    Low back pain    Peptic ulcer disease    IBS with constipation    cataracts    arthritis    broken knee    migraines  Past Surgical History:    breast tumor    back tumor    R inguinal herniorraphy    R femur fracture repair   Family History:    father-deceased    mother-deceased-HTN  Social History:    Single lives alone--SO died 2006/05/70    Current Smoker-1pack per week, used to smoke 3-4 ppd    Alcohol use-no    Drug use-no  Risk Factors:  Tobacco use:  current Drug use:  no Alcohol use:  no   Review of Systems  General      See HPI      Denies chills, fever, and sweats.  ENT      See HPI  CV      See HPI      Denies chest pain or discomfort and swelling of feet.  Resp      Denies cough, shortness of breath, and sputum productive.  GI      See HPI  MS      See HPI  Neuro      Denies numbness, tingling, and weakness.  Psych      Denies anxiety and depression.      especially since SO died 2 years ago  Endo      Denies cold intolerance and weight change.  Heme      Denies abnormal bruising, bleeding, and enlarge lymph nodes.   Physical Exam  General:     alert and underweight appearing.   Ears:     tympanic membrane on the left is clear but there is green fluid behind it. Neck:     No lymphadenopathy, JVD, thyromegaly or carotid bruits.  Lungs:     Clear to auscultation bilaterally with good air movement, normal expansion.  Heart:     Normal S1 and S2. No murmurs or extracardiac sounds. PMI is nondisplaced.  Abdomen:     Soft, nontender,  nondistended, + bowel sounds, no organomegaly.  Msk:     there is full range of motion at the right wrist.  there is some tenderness to palpation over the extensor tendon of the thumb. Extremities:     No edema, calf tenderness or swelling.     Impression & Recommendations:  Problem # 1:  HYPERTENSION (ICD-401.9) although her blood pressures appeared to respond well to clonidine she is having side effects of dizziness and fatigue so we will switch her to amlodipine at 5 mg a day.  We need to track down any lab work she had done in the emergency department, but she probably will need her lipids and chemistry checked at her follow-up appointments. Her updated medication list for this problem includes:    Amlodipine Besylate 5 Mg Tabs (Amlodipine besylate) .Marland Kitchen... 1 by mouth once daily   Problem # 2:  WRIST PAIN, RIGHT (ICD-719.43) she appears to have a tendinitis, but is a bad candidate for NSAIDs due to her peptic ulcer disease.  We will get an x-ray for further evaluation, and she may need a local steroid injection if the x-ray looks okay. 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 Her updated medication list for this problem includes:    Doxycycline Hyclate 100 Mg Caps (Doxycycline hyclate) .Marland Kitchen... 1 by mouth two times a day   Problem # 4:  LEG PAIN, RIGHT (ICD-729.5) we are going to get an x-ray of her right leg to make sure that her hardware is still in place, and of her right knee for further evaluation Orders: Diagnostic X-Ray/Fluoroscopy (Diagnostic X-Ray/Flu)   Medications Added to Medication List This Visit: 1)  Amlodipine Besylate 5 Mg Tabs (Amlodipine besylate) .Marland Kitchen.. 1 by mouth once daily 2)  Doxycycline Hyclate  100 Mg Caps (Doxycycline  hyclate) .Marland Kitchen.. 1 by mouth two times a day   Patient Instructions: 1)  Please schedule a follow-up appointment in 1 month. 2)  The patient will be called with the results when they are available.

## 2010-02-17 NOTE — Progress Notes (Signed)
Summary: MRI referral  Phone Note Outgoing Call   Call placed by: Sonny Dandy,  April 12, 2007 1:53 PM Summary of Call: appointment set for MRI on 04/18/07 at 1030/1100am, order faxed and patient notified in office. Initial call taken by: Sonny Dandy,  April 12, 2007 1:54 PM

## 2010-02-17 NOTE — Assessment & Plan Note (Signed)
Summary: recheck wound.cbt   Visit Type:  Follow-up Referring Provider:  ap er Primary Provider:  na  CC:  recheck wound post op.  History of Present Illness: status post thickness skin graft and the cutaneous Achilles tendon release after previous open treatment internal fixation of the right ankle 07/18/09 DOS.  Percocet 5 for pain.  Today is recheck per Dr. Malvin Johns.  She has some swelling of her right foot, still Taking Precocet and Bactrim.  Dr. 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

## 2010-02-17 NOTE — Miscellaneous (Signed)
Summary: Home Care Report  Home Care Report   Imported By: Elvera Maria 04/18/2009 10:40:25  _____________________________________________________________________  External Attachment:    Type:   Image     Comment:   wound therapy order

## 2010-02-17 NOTE — Letter (Signed)
Summary: [facility]-office note  [facility]-office note   Imported By: Donneta Romberg 09/23/2006 16:06:03  _____________________________________________________________________  External Attachment:    Type:   Image     Comment:   External Document

## 2010-02-17 NOTE — Assessment & Plan Note (Signed)
Summary: POST OP 2/REM STITCHES/UHC/MEDICAID/CAF   Visit Type:  Follow-up Referring Provider:  ap er Primary Provider:  Erle Crocker MD  CC:  post op ankle.  History of Present Illness: I saw Alicia [name] in the office today for a followup visit.  She is a 70 years old woman with the complaint of:  DOS OTIF BIMALL LEFT ANKLE 01/29/09.  Medication LORCET PLUS  splint change the cast today and we got her foot up to about -5 full of neutral dorsiflexion.  She did have some wound issues on the medial side but we treated that with Xeroform dressing of 4 x 4's  Return in 2 weeks for cast change try to get the foot up higher.  Allergies: 1)  Aspirin   Impression & Recommendations:  Problem # 1:  CLOSED BIMALLEOLAR FRACTURE (ICD-824.4)  Orders: Post-Op Check (24401)  Problem # 2:  AFTERCARE FOLLOW SURGERY MUSCULOSKEL SYSTEM NEC (ICD-V58.78)  Orders: Post-Op Check (02725)  Patient Instructions: 1)  Please schedule a follow-up appointment in 2 weeks. 2)  cast change

## 2010-02-17 NOTE — Letter (Signed)
Summary: Letter assessment [facility] referral  Letter assessment [facility] referral   Imported By: Cammie Sickle 05/26/2009 20:09:52  _____________________________________________________________________  External Attachment:    Type:   Image     Comment:   External Document

## 2010-02-17 NOTE — Progress Notes (Signed)
Summary: call for home therapy discharge  Phone Note Other Incoming   Caller: Physical therapist  Summary of Call: Denzil Magnuson, therapist w/[facility], called to relay that he had seen patient and evaluated.  Recommends discharge from home therapy.  Evaluate for out-patient therapy at appointment here 03/13/09. States if any questions, please call [phone]. Initial call taken by: Cammie Sickle,  March 07, 2009 11:57 AM

## 2010-02-17 NOTE — Progress Notes (Signed)
Summary: call from [facility]  Phone Note Other Incoming   Caller: nurse from [facility] Summary of Call: Victorino Dike from [facility] is with patient today, she says that she put a piece of material over patients ligament before putting wound vac on, this is what they usually do, and also they would like to know if when patient comes in if we can cut a yellow piece of skin in the middle of the wound so that it will heal faster, I asked if they could do that and they said that only a Doctor can do this, just FYI Initial call taken by: Cammie Sickle,  April 21, 2009 4:24 PM

## 2010-02-17 NOTE — Miscellaneous (Signed)
Summary: Orders Update - GI  Clinical Lists Changes  Orders: Added new Referral order of Gastroenterology Referral (GI) - Signed 

## 2010-02-17 NOTE — Miscellaneous (Signed)
Summary: [facility] referral faxed  [facility] referral faxed   Imported By: Cammie Sickle 04/15/2009 14:16:16  _____________________________________________________________________  External Attachment:    Type:   Image     Comment:   External Document

## 2010-02-17 NOTE — Miscellaneous (Signed)
Summary: No pre-cert required on surgery scheduled  Clinical Lists Changes  Per primary insurer, [employer] - Re: out-patient surgery scheduled at [facility] 07/18/09 RT foot,  Cpt's [id number] / [phone] / [phone] do not require pre-certification; per Clydie Braun.  Medicaid, 2ndary insurance, no pre-auth required on these codes (dx 998.31,824.4).

## 2010-02-17 NOTE — Assessment & Plan Note (Signed)
Summary: 6 WK RE-CK LT SHOULDER/POST OP 10/25/08/MEDICARE,MEDICAID/CAF   Visit Type:  Follow-up  CC:  postop.  History of Present Illness: I saw Annai Juhasz in the office today for a 6 WEEK  followup visit.  She is a 70 years old woman status post LEFT rotator cuff repair via open technique.  Date of surgery October 25, 2004. Her 6 week out. She is doing well taking Lorcet plus as needed. She's had the hospital for physical therapy. She is having some radicular pain down her LEFT arm into her LEFT hand.  On examination, she is nontender cervical spine, mild tenderness over the incision of the LEFT shoulder. She has full internal rotation, full forward elevation. She has grade 5- manual muscle testing supraspinatus.  Assessment perhaps some radicular pain from cervical disc disease or carpal tunnel syndrome. She does not wish to work that up at this time.  Recommend continue physical therapy and followup with me in about 2 months.      Allergies: 1)  Aspirin   Medications Added to Medication List This Visit: 1)  Lyrica 50 Mg Caps (Pregabalin)  Other Orders: Post-Op Check (16109)  Patient Instructions: 1)  Continue Therapy  2)  f/u 2 months

## 2010-02-17 NOTE — Assessment & Plan Note (Signed)
Summary: 1 MO RECK WOUND/EVERCARE/BSF   Visit Type:  Follow-up Referring Provider:  ap er Primary Provider:  na  CC:  recheck wound.  History of Present Illness: status post thickness skin graft and percutaneous Achilles tendon release after previous open treatment internal fixation of the right ankle  DATES OF SURGERY   OTIF ANKLE 1.12.2011  STSG 7.1.2011  Percocet 5 for pain, no refills needed.  Today is a one month recheck.  Doing well, wound almost closed.  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 Patient Level II (16109)  Problem # 2:  CLOSED BIMALLEOLAR FRACTURE (ICD-824.4) Assessment: Improved  Orders: Est. Patient Level II (60454)  Patient Instructions: 1)  Please schedule a follow-up appointment in 1 month. 2)  Left shoulder pain post op shoulder will check at that time   Orders Added: 1)  Est. Patient Level II [09811]

## 2010-02-19 NOTE — Assessment & Plan Note (Signed)
Summary: mri results right shoulder with contrast aph/evercare/medicai...   Visit Type:  Follow-up Referring Provider:  ap er Primary Provider:  na  CC:  right shoulder pain.  History of Present Illness: I saw Alicia [name] in the office today for a followup visit.  She is a 70 years old woman with the complaint of:  right shoulder pain.  Today she also complained of pain in her RIGHT arm RIGHT hand with numbness and tingling radiating up through the hand into the forearm and shoulder starting in the small finger  I spoke with a radiologist regarding this lesion and it is definitely on one lesion including the glenoid lesion of posterior scapular lesion consistent with lipoma.  It appears benign.  I discussed with the patient prognosis and need for followup.  We'll treat the presumed carpal tunnel syndrome with the brace/splint.   IMPRESSION:   1.  Lesion along the posterior aspect of the shoulder is consistent with a lipoma. 2.  Supraspinatus and infraspinatus tendinopathy with a small articular sided tear of the anterior and far lateral supraspinatus. 3.  Acromioclavicular degenerative disease and subacromial/subdeltoid bursitis.  Allergies: 1)  Aspirin   Impression & Recommendations:  Problem # 1:  LIPOMA (ICD-214.9)  The MRI was done at [facility] and it was reviewed with the report   Orders: Est. Patient Level II (19147)  Problem # 2:  SHOULDER PAIN (ICD-719.41)  Her updated medication list for this problem includes:    Percocet 5-325 Mg Tabs (Oxycodone-acetaminophen) .Marland Kitchen... 1 by mouth q 4 as needed pain  Orders: Est. Patient Level II (82956)  Patient Instructions: 1)  Brace right hand at night  2)  return in 3 months for xrays right shoulder  3)  You have a large Lipoma in the right shoudler it looks benign, it needs to be followed !   Orders Added: 1)  Est. Patient Level II [21308]

## 2010-02-19 NOTE — Assessment & Plan Note (Signed)
Summary: SHOULDER PAIN + POST OP ANKLE SURG//EVERCARE+ MEDICAID/CAF   Visit Type:  Follow-up Referring Provider:  ap er Primary Provider:  na  CC:  right shoulder pain.  History of Present Illness: I saw Alicia [name] in the office today for a followup visit.  She is a 70 years old woman with the complaint of:  right shoulder pain.  No injury.  Was scheduled for the left shoulder, right hurts worse.  She complains of painful range of motion in the RIGHT shoulder.  She is also status post open treatment internal fixation, RIGHT ankle complicated by necrosis of the anterior ankle, which was treated with a skin graft.  She is improved with the skin graft and the wound is now closed.  physical examination reveals an awake, alert, well-developed, well-nourished, thin female, who is in no acute distress. She has neurologic function in the RIGHT upper extremity. Elbow, wrist, and hand range of motion is otherwise, normal. There are no soft tissue masses.As far as the shoulder goes, she has painful forward elevation up to 120 there is no crepitance. She has normal external rotation. There is no atrophy. Lymph nodes in the axilla were normal.  X-rays were taken. She has a large glenoid cyst is 2 cm long by 2 cm wide and will require additional workup with an MRI.  Contrast MRI was ordered. Patient will return after the study has been completed      Allergies: 1)  Aspirin  Review of Systems Constitutional:  Denies weight loss and fatigue.   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 Patient Level III (16109)  Problem # 2:  SHOULDER PAIN (ICD-719.41) Assessment: New  Her updated medication list for this problem includes:    Percocet 5-325 Mg Tabs (Oxycodone-acetaminophen) .Marland Kitchen... 1 by mouth q 4 as needed pain  Orders: T-Creatinine blood clearance (60454-09811) Est. Patient Level III  (91478)  Problem # 3:  UNSPECIFIED CYST OF BONE (ICD-733.20) Assessment: New  Orders: Est. Patient Level III (29562)  Other Orders: T-BUN (13086-57846)  Patient Instructions: 1)  MRI RIGHT SHOULDER  2)  RETURN AFTER MR    Orders Added: 1)  T-BUN [96295-28413] 2)  T-Creatinine blood clearance [82575-24110] 3)  Est. Patient Level III [24401]

## 2010-02-19 NOTE — Progress Notes (Signed)
Summary: please call Radiology/MRI- question  Phone Note Other Incoming   Caller: Alicia [name], MRI [facility] Summary of Call: Alicia [name] at Radiology/ MRI at [facility], Ph [phone], called to relay that they have [name] there now for MRI fo RT shoulder w/contrast; she had blood draw on 12/29/09 however did not have creatinin.  Patient states "does not want to be stuck again."   Please advise - ph [phone] Initial call taken by: Cammie Sickle,  December 31, 2009 3:07 PM

## 2010-03-16 ENCOUNTER — Telehealth: Payer: Self-pay | Admitting: Orthopedic Surgery

## 2010-03-26 NOTE — Progress Notes (Signed)
Summary: vicodin filled for patient  Phone Note Call from Patient   Summary of Call: Alicia [name] (04/19/1940)  wants a new prescription for Oxycodone.  Her # [id number] Initial call taken by: Jacklynn Ganong,  March 16, 2010 11:27 AM  Follow-up for Phone Call        declined  Follow-up by: Fuller Canada MD,  March 16, 2010 12:22 PM  Additional Follow-up for Phone Call Additional follow up Details #1::        Advised patient of your reply and she asked if you will prescribe anything else.  Said her shoulder and ankle are hurting.   She uses [facility] Additional Follow-up by: Jacklynn Ganong,  March 16, 2010 1:32 PM    New/Updated Medications: VICODIN 5-500 MG TABS (HYDROCODONE-ACETAMINOPHEN) 1 q 4 as needed-pain Prescriptions: VICODIN 5-500 MG TABS (HYDROCODONE-ACETAMINOPHEN) 1 q 4 as needed-pain  #84 x 1   Entered and Authorized by:   Fuller Canada MD   Signed by:   Fuller Canada MD on 03/16/2010   Method used:   Telephoned to ...       [facility]       [address]       [place], [place]  [address]       Ph: [phone]       Fax: [phone]   RxID:   [id number]

## 2010-04-05 LAB — CBC
HCT: 34.2 % — ABNORMAL LOW (ref 36.0–46.0)
HCT: 35.8 % — ABNORMAL LOW (ref 36.0–46.0)
Hemoglobin: 11.2 g/dL — ABNORMAL LOW (ref 12.0–15.0)
Hemoglobin: 11.7 g/dL — ABNORMAL LOW (ref 12.0–15.0)
MCHC: 32.7 g/dL (ref 30.0–36.0)
MCHC: 32.7 g/dL (ref 30.0–36.0)
MCV: 87.2 fL (ref 78.0–100.0)
MCV: 87.6 fL (ref 78.0–100.0)
Platelets: 188 10*3/uL (ref 150–400)
Platelets: 192 10*3/uL (ref 150–400)
RBC: 3.91 MIL/uL (ref 3.87–5.11)
RBC: 4.1 MIL/uL (ref 3.87–5.11)
RDW: 14.5 % (ref 11.5–15.5)
RDW: 14.5 % (ref 11.5–15.5)
WBC: 7.6 10*3/uL (ref 4.0–10.5)
WBC: 8.6 10*3/uL (ref 4.0–10.5)

## 2010-04-05 LAB — DIFFERENTIAL
Basophils Absolute: 0 10*3/uL (ref 0.0–0.1)
Basophils Absolute: 0 10*3/uL (ref 0.0–0.1)
Basophils Relative: 0 % (ref 0–1)
Basophils Relative: 0 % (ref 0–1)
Eosinophils Absolute: 0.1 10*3/uL (ref 0.0–0.7)
Eosinophils Absolute: 0.1 10*3/uL (ref 0.0–0.7)
Eosinophils Relative: 1 % (ref 0–5)
Eosinophils Relative: 1 % (ref 0–5)
Lymphocytes Relative: 19 % (ref 12–46)
Lymphocytes Relative: 29 % (ref 12–46)
Lymphs Abs: 1.6 10*3/uL (ref 0.7–4.0)
Lymphs Abs: 2.2 10*3/uL (ref 0.7–4.0)
Monocytes Absolute: 0.3 10*3/uL (ref 0.1–1.0)
Monocytes Absolute: 0.6 10*3/uL (ref 0.1–1.0)
Monocytes Relative: 4 % (ref 3–12)
Monocytes Relative: 7 % (ref 3–12)
Neutro Abs: 5 10*3/uL (ref 1.7–7.7)
Neutro Abs: 6.2 10*3/uL (ref 1.7–7.7)
Neutrophils Relative %: 66 % (ref 43–77)
Neutrophils Relative %: 72 % (ref 43–77)

## 2010-04-05 LAB — BASIC METABOLIC PANEL
BUN: 10 mg/dL (ref 6–23)
BUN: 12 mg/dL (ref 6–23)
BUN: 14 mg/dL (ref 6–23)
CO2: 28 mEq/L (ref 19–32)
CO2: 29 mEq/L (ref 19–32)
CO2: 26 mEq/L (ref 19–32)
Calcium: 9.1 mg/dL (ref 8.4–10.5)
Calcium: 9.3 mg/dL (ref 8.4–10.5)
Calcium: 10.1 mg/dL (ref 8.4–10.5)
Chloride: 105 mEq/L (ref 96–112)
Chloride: 106 mEq/L (ref 96–112)
Chloride: 102 mEq/L (ref 96–112)
Creatinine, Ser: 0.67 mg/dL (ref 0.4–1.2)
Creatinine, Ser: 0.74 mg/dL (ref 0.4–1.2)
Creatinine, Ser: 0.68 mg/dL (ref 0.4–1.2)
GFR calc Af Amer: >60 mL/min (ref >60)
GFR calc Af Amer: >60 mL/min (ref >60)
GFR calc Af Amer: >60 mL/min (ref >60)
GFR calc non Af Amer: >60 mL/min (ref >60)
GFR calc non Af Amer: >60 mL/min (ref >60)
GFR calc non Af Amer: >60 mL/min (ref >60)
Glucose, Bld: 132 mg/dL — ABNORMAL HIGH (ref 70–99)
Glucose, Bld: 148 mg/dL — ABNORMAL HIGH (ref 70–99)
Glucose, Bld: 96 mg/dL (ref 70–99)
Potassium: 3.8 mEq/L (ref 3.5–5.1)
Potassium: 4 mEq/L (ref 3.5–5.1)
Potassium: 4.2 mEq/L (ref 3.5–5.1)
Sodium: 138 mEq/L (ref 135–145)
Sodium: 139 mEq/L (ref 135–145)
Sodium: 137 mEq/L (ref 135–145)

## 2010-04-05 LAB — SURGICAL PCR SCREEN
MRSA, PCR: NEGATIVE
Staphylococcus aureus: NEGATIVE

## 2010-04-05 LAB — HEMOGLOBIN AND HEMATOCRIT, BLOOD
HCT: 38.8 % (ref 36.0–46.0)
Hemoglobin: 12.8 g/dL (ref 12.0–15.0)

## 2010-04-06 ENCOUNTER — Encounter: Payer: Self-pay | Admitting: Orthopedic Surgery

## 2010-04-09 ENCOUNTER — Ambulatory Visit (INDEPENDENT_AMBULATORY_CARE_PROVIDER_SITE_OTHER): Payer: PRIVATE HEALTH INSURANCE | Admitting: Orthopedic Surgery

## 2010-04-09 ENCOUNTER — Encounter: Payer: Self-pay | Admitting: Orthopedic Surgery

## 2010-04-09 DIAGNOSIS — M25519 Pain in unspecified shoulder: Secondary | ICD-10-CM

## 2010-04-09 MED ORDER — METHYLPREDNISOLONE ACETATE 40 MG/ML IJ SUSP: 40.0000 mg | Freq: Once | INTRAMUSCULAR | Status: AC

## 2010-04-09 NOTE — Patient Instructions (Signed)
You have received a steroid shot. 15% of patients experience increased pain at the injection site with in the next 24 hours. This is best treated with ice and tylenol extra strength 2 tabs every 8 hours. If you are still having pain please call the office.    Come back in 3 months for recheck and x-rays right shoulder

## 2010-04-09 NOTE — Discharge Summary (Signed)
Right shoulder injection subacromial space   Depo 40   Lidocaine 4 cc   Tolerated well   xrays 2 view right shoulder  Lipoma subglenoid no change

## 2010-04-09 NOTE — Progress Notes (Signed)
History obtained from chart review.  Painful forward elevation of the RIGHT shoulder with passive range of motion of her 110, abduction, forward elevation, 150. External rotation 45 with her arm at her side.  Neurovascular exam intact.  Patient awake, alert, and oriented x3.  General appearance was normal.

## 2010-04-23 LAB — PROTIME-INR
INR: 0.98 (ref 0.00–1.49)
Prothrombin Time: 12.9 seconds (ref 11.6–15.2)

## 2010-04-23 LAB — APTT: aPTT: 29 seconds (ref 24–37)

## 2010-05-04 LAB — CBC
HCT: 29.3 % — ABNORMAL LOW (ref 36.0–46.0)
HCT: 30.7 % — ABNORMAL LOW (ref 36.0–46.0)
HCT: 39.8 % (ref 36.0–46.0)
Hemoglobin: 13.2 g/dL (ref 12.0–15.0)
Hemoglobin: 9.7 g/dL — ABNORMAL LOW (ref 12.0–15.0)
Hemoglobin: 9.9 g/dL — ABNORMAL LOW (ref 12.0–15.0)
MCHC: 32.3 g/dL (ref 30.0–36.0)
MCHC: 33 g/dL (ref 30.0–36.0)
MCHC: 33.1 g/dL (ref 30.0–36.0)
MCV: 86.7 fL (ref 78.0–100.0)
MCV: 86.8 fL (ref 78.0–100.0)
MCV: 89.6 fL (ref 78.0–100.0)
Platelets: 185 10*3/uL (ref 150–400)
Platelets: 187 10*3/uL (ref 150–400)
Platelets: 235 10*3/uL (ref 150–400)
RBC: 3.27 MIL/uL — ABNORMAL LOW (ref 3.87–5.11)
RBC: 3.55 MIL/uL — ABNORMAL LOW (ref 3.87–5.11)
RBC: 4.59 MIL/uL (ref 3.87–5.11)
RDW: 13.6 % (ref 11.5–15.5)
RDW: 13.7 % (ref 11.5–15.5)
RDW: 13.9 % (ref 11.5–15.5)
WBC: 11.1 10*3/uL — ABNORMAL HIGH (ref 4.0–10.5)
WBC: 7.7 10*3/uL (ref 4.0–10.5)
WBC: 7.7 10*3/uL (ref 4.0–10.5)

## 2010-05-04 LAB — COMPREHENSIVE METABOLIC PANEL
ALT: 10 U/L (ref 0–35)
AST: 13 U/L (ref 0–37)
Albumin: 3.8 g/dL (ref 3.5–5.2)
Alkaline Phosphatase: 88 U/L (ref 39–117)
BUN: 7 mg/dL (ref 6–23)
CO2: 26 mEq/L (ref 19–32)
Calcium: 9.3 mg/dL (ref 8.4–10.5)
Chloride: 105 mEq/L (ref 96–112)
Creatinine, Ser: 0.55 mg/dL (ref 0.4–1.2)
GFR calc Af Amer: >60 mL/min (ref >60)
GFR calc non Af Amer: >60 mL/min (ref >60)
Glucose, Bld: 109 mg/dL — ABNORMAL HIGH (ref 70–99)
Potassium: 3.8 mEq/L (ref 3.5–5.1)
Sodium: 140 mEq/L (ref 135–145)
Total Bilirubin: 0.5 mg/dL (ref 0.3–1.2)
Total Protein: 6.6 g/dL (ref 6.0–8.3)

## 2010-05-04 LAB — URINALYSIS, ROUTINE W REFLEX MICROSCOPIC
Bilirubin Urine: NEGATIVE
Glucose, UA: NEGATIVE mg/dL
Glucose, UA: NEGATIVE mg/dL
Hgb urine dipstick: NEGATIVE
Ketones, ur: 15 mg/dL — AB
Ketones, ur: NEGATIVE mg/dL
Nitrite: NEGATIVE
Nitrite: NEGATIVE
Protein, ur: NEGATIVE mg/dL
Protein, ur: NEGATIVE mg/dL
Specific Gravity, Urine: 1.022 (ref 1.005–1.030)
Specific Gravity, Urine: 1.023 (ref 1.005–1.030)
Urobilinogen, UA: 1 mg/dL (ref 0.0–1.0)
Urobilinogen, UA: 1 mg/dL (ref 0.0–1.0)
pH: 5.5 (ref 5.0–8.0)
pH: 5.5 (ref 5.0–8.0)

## 2010-05-04 LAB — URINE CULTURE: Colony Count: >=100000

## 2010-05-04 LAB — HEPARIN LEVEL (UNFRACTIONATED): Heparin Unfractionated: <0.1 IU/mL — ABNORMAL LOW (ref 0.30–0.70)

## 2010-05-04 LAB — TYPE AND SCREEN
ABO/RH(D): A POS
Antibody Screen: NEGATIVE

## 2010-05-04 LAB — BASIC METABOLIC PANEL
BUN: 4 mg/dL — ABNORMAL LOW (ref 6–23)
CO2: 26 mEq/L (ref 19–32)
Calcium: 8.1 mg/dL — ABNORMAL LOW (ref 8.4–10.5)
Chloride: 105 mEq/L (ref 96–112)
Creatinine, Ser: 0.65 mg/dL (ref 0.4–1.2)
GFR calc Af Amer: >60 mL/min (ref >60)
GFR calc non Af Amer: >60 mL/min (ref >60)
Glucose, Bld: 123 mg/dL — ABNORMAL HIGH (ref 70–99)
Potassium: 3.5 mEq/L (ref 3.5–5.1)
Sodium: 138 mEq/L (ref 135–145)

## 2010-05-04 LAB — URINE MICROSCOPIC-ADD ON

## 2010-05-04 LAB — ABO/RH: ABO/RH(D): A POS

## 2010-05-04 LAB — PROTIME-INR
INR: 1 (ref 0.00–1.49)
Prothrombin Time: 13.1 seconds (ref 11.6–15.2)

## 2010-05-04 LAB — APTT: aPTT: 31 seconds (ref 24–37)

## 2010-06-02 NOTE — Procedures (Signed)
VASCULAR LAB EXAM   INDICATION:  Preop exam for bypass graft placement.   HISTORY:  Diabetes:  No.  Cardiac:  No.  Hypertension:  Yes.   EXAM:  Right lower extremity vein mapping.   IMPRESSION:  1. Patent and compressible right greater saphenous vein with diameter      measurements ranging from 0.12 cm to 0.54 cm.  2. The right lesser saphenous vein was not adequately visualized.  3. See attached worksheet for all diameter measurements.   ___________________________________________  V. Charlena Cross, MD   CH/MEDQ  D:  11/06/2007  T:  11/06/2007  Job:  [id number]

## 2010-06-02 NOTE — Procedures (Signed)
BYPASS GRAFT EVALUATION   INDICATION:  Follow-up evaluation of right fem-pop bypass graft.  Patient complains of 4-5 months of right second toe pain and  discoloration.   HISTORY:  Diabetes:  No.  Cardiac:  No.  Hypertension:  Yes.  Smoking:  Former smoker.  Previous Surgery:  Right fem-pop bypass graft with Propaten on January 24, 2008, by Dr. Myra Gianotti.   SINGLE LEVEL ARTERIAL EXAM                               RIGHT              LEFT  Brachial:                    132                138  Anterior tibial:             106                144  Posterior tibial:            94                 126  Peroneal:  Ankle/brachial index:        0.76               >1.0   PREVIOUS ABI:  Date: 04/29/2008  RIGHT:  0.86  LEFT:  0.92   LOWER EXTREMITY BYPASS GRAFT DUPLEX EXAM:   DUPLEX:  No flow is identified in the right fem-pop bypass graft.  Doppler arterial waveforms are triphasic in the right common femoral  artery and monophasic in the right popliteal artery.   IMPRESSION:  1. Right ABI is decreased since previous study.  2. Left ABI is stable compared to previous study.  3. Occluded right fem-pop bypass graft.   ___________________________________________  V. Charlena Cross, MD   MC/MEDQ  D:  08/12/2008  T:  08/12/2008  Job:  [id number]

## 2010-06-02 NOTE — Discharge Summary (Signed)
NAME:  LATANJA, LEHENBAUER                ACCOUNT NO.:  [id number]   MEDICAL RECORD NO.:  [id number]          PATIENT TYPE:  INP   LOCATION:  2011                         FACILITY:  [facility]   PHYSICIAN:  Juleen China IV, MDDATE OF BIRTH:  September 20, 1940   DATE OF ADMISSION:  01/24/2008  DATE OF DISCHARGE:  01/28/2008                               DISCHARGE SUMMARY   ADMISSION DIAGNOSIS:  Peripheral vascular disease with severe right leg  claudication.   FINAL DISCHARGE DIAGNOSES:  1. Peripheral vascular disease with severe right leg claudication      status post right femoral-to-above knee popliteal artery bypass.  2. Hypertension.  3. Overactive bladder.  4. Intolerance to ASPIRIN.   PROCEDURE:  On January 24, 2008, right femoral-to-above knee popliteal  artery bypass using 6-mm Propaten graft by Dr. Venida Jarvis.   BRIEF HISTORY:  Ms. Slotnick is a 70 year old black female with severe  bilateral claudication in her lower extremities, right greater than the  left.  She reports claudication is severely lifestyle limiting.  Dr.  Myra Gianotti attempt to not operative therapy as she cannot tolerate the  level of discomfort anymore.  Her arteries were very small, so he did  not feel she would be a percutaneous candidate.  Therefore, surgical  revascularization was recommended.   HOSPITAL COURSE:  Ms. Ascher was electively admitted to [facility] on January 28, 2008.  She underwent the previously-mentioned  procedure, and she was transferred to Step-Down Unit 3200 where she  remained for the first 24 hours.  She was later transferred to Telemetry  Unit 2000 where she remained until discharge.  Postoperative ABIs were  0.99 on the right and 0.9 on the left.  We did ask Physical Therapy to  see her for mobility.  They recommended home health physical therapy as  well as a rolling walker which were ordered.  Initially, her mobility  was limited to right knee pain as she had a fair  amount of swelling  postoperatively; however, with elevation, this did begin improving  mobilization with easy for her.  Her incisions healed well without signs  of infection.  We did monitor her for some postoperative fever with  temperature up to 101.2 on postop day #2, this was felt most likely  secondary to atelectasis.  A Foley catheter was discontinued as well at  that time.  She had a few intermittent low-grade fevers after that;  therefore, we did send her urine for urinalysis and urine culture.  At  the time of discharge, her urine culture was still pending.  Her  urinalysis showed a small amount of leukocytes, no nitrites and her  urine microscopic showed 7-10 white blood cells.  She was having no  dysuria.  She did have some urinary urgency, but is on Toviaz for  overactive bladder which she was not taking.  Her white count was  normal.  Therefore, we opted to discharge her home without antibiotics  since we felt that culture results will be back within about 24 hours  before discharge.  On January 10, she was  felt appropriate for discharge  to home.  Temperature was down at 98.2, oxygen saturation 98% on room  air, blood pressure 115/58, and heart rate around 90-100 in a regular  rhythm.  She had 1+ dorsalis pedis and posterior tibial pulses on the  right.  Again, she was ambulating independently.   DISPOSITION:  Ms. Kozakiewicz was discharged home on postop day #4, January 28, 2008, in stable and improving condition.   DISCHARGE INSTRUCTIONS:  1. Oxycodone 5 mg 1-2 tablets p.o. q.4 h. p.r.n. pain.  2. Amlodipine 10 mg p.o. q.a.m.  3. Cilostazol 100 mg p.o. b.i.d.  4. Toviaz 4 mg p.o. nightly.   DISCHARGE INSTRUCTIONS:  She is to continue daily walking exercises,  avoid driving or heavy lifting, and to follow up with Dr. Myra Gianotti.  She  can shower and clean her incisions gently with soap and water.  She will  see Dr. Myra Gianotti in approximately 3 weeks with ABIs that should  call  sooner if she has fever, redness, purulent drainage from incision sites  or increased pain.  We have ordered home health physical therapy.  I  have also called the nurse at our office to follow up Ms. Laidlaw' urine  culture results on January 29, 2008, and to call the patient with the  results.  If her results were positive, Dr. Myra Gianotti can call an  antibiotic prescription if needed.      Jerold Coombe, P.A.      Jorge Ny, MD  Electronically Signed    AWZ/MEDQ  D:  01/28/2008  T:  01/28/2008  Job:  [id number]   cc:   Jorge Ny, MD  Erle Crocker, M.D.

## 2010-06-02 NOTE — Assessment & Plan Note (Signed)
OFFICE VISIT   Bardwell, Linnell P  DOB:  [birth year]                                       04/29/2008  WFUXN#:[id number]   HISTORY:  This is a 70 year old female who underwent right femoral to  above-knee artery popliteal artery bypass graft with probe patent on  January 24, 2008.  This was done for significant claudication.  Her  symptoms have improved.  She did have single-vessel runoff via the  peroneal artery.  While hospital she is found to have elevated blood  sugars.  She comes in today for bypass graft surveillance.  She states  that the past several weeks she has had increased pain and swelling in  her legs, left being greater than the right.  She describes this as an  electrical shocking type pain.  It that occurs mostly at rest,  especially at night.   On exam, she is in no acute distress.  Blood pressure is 130/73 pulse is  102.  Her legs are warm and well perfused.  There is minimal edema.  Incisions are well healed.  Motor and sensory function are preserved in  both feet.   DIAGNOSTIC STUDIES:  Bypass graft surveillance was performed today.  This reveals no evidence of stenosis.   ASSESSMENT/PLAN:  Bilateral leg pain.   The patient's bypass graft is widely patent.  Based on her  symptomatology I feel like her pain is more consistent with neuropathy  secondary to progression of her diabetes.  I am giving her prescription  for Neurontin.  She is going to see Dr. Jen Mow in 1-2 weeks., we can  adjust Neurontin based on how this initial does for her.  Again, I do  not think that her pain is vascular in origin at this time,  I am going  to see her back in 3 months for repeat ultrasound.   Jorge Ny, MD  Electronically Signed   VWB/MEDQ  D:  04/29/2008  T:  04/30/2008  Job:  [id number]   cc:   Erle Crocker, M.D.

## 2010-06-02 NOTE — Op Note (Signed)
NAME:  Alicia [name], Alicia [name]                ACCOUNT NO.:  [id number]   MEDICAL RECORD NO.:  [id number]          PATIENT TYPE:  INP   LOCATION:  3314                         FACILITY:  [facility]   PHYSICIAN:  Juleen China IV, MDDATE OF BIRTH:  1940/03/19   DATE OF PROCEDURE:  01/24/2008  DATE OF DISCHARGE:                               OPERATIVE REPORT   PREPROCEDURE DIAGNOSIS:  Right leg severe claudication.   POSTOPERATIVE DIAGNOSIS:  Right leg severe claudication.   PROCEDURE PERFORMED:  Right femoral to above-the-knee popliteal artery  bypass graft with 6-mm Propaten PTFE.   SURGEON:  1. Charlena Cross, MD   ASSISTANT:  Zenaida Niece.   ANESTHESIA:  General.   BLOOD LOSS:  50 mL.   COMPLICATIONS:  None.   FINDINGS:  Excellent Doppler signal in her peroneal artery.   DRAINS:  None.   CULTURES:  None.   SPECIMENS:  None.   INDICATIONS:  This is a 70 year old female with a severe bilateral  claudication in right leg being worse than the left.  She states that  her claudication is severely lifestyle limiting.  We have attempted a  nonoperative therapy, and she cannot tolerate the level of discomfort in  anymore.  Her arteries were extremely small, and therefore I do not feel  that she is a percutaneous candidate.  We elected to proceed with  surgical revascularization.  She was vein mapped preoperatively and  found to not have adequate vein in her right leg.  Risks and benefits  were discussed with the patient for performing of femoral popliteal  bypass graft with Gore-Tex.  She understands this and wishes to proceed.   PROCEDURE:  The patient was identified in the holding area and taken to  room 9.  She was placed supine on the table.  General endotracheal  anesthesia was administered.  The patient was prepped and draped in  standard sterile fashion.  A time-out was called.  Antibiotics were  given.  A longitudinal incision was made in the groin crease over the  palpable pulse of the femoral artery.  Cautery was used to dissect the  subcutaneous tissue.  Femoral sheath was identified and opened sharply.  Femoral artery was mobilized up to the level of inguinal ligament.  Profunda femoral and superficial femoral artery were each individually  mobilized and encircled with vessel loops.  Next, attention was turned  towards the above-the-knee popliteal artery.  Incision was made in the  muscle group with a #10 blade.  Cautery was used to dissect the  subcutaneous tissue.  The crural fascia was divided with Bovie cautery.  Using a combination of cautery and blunt dissection, the popliteal space  was entered.  Popliteal artery was identified.  It was found to be  adherent to the popliteal vein, which was mobilized with Metzenbaum  scissors.  Once adequate length on the popliteal artery was obtained, I  used a long straight tunneler to tunnel from between the 2 incisions.  Once this was done, 40-cm Propaten Gore-Tex graft was brought to the  tunnel.  The patient was given systemic  heparinization at this point.  I  set up for the proximal anastomosis.  The artery was occluded with  vascular clamps, #11 blade was used to make an arteriotomy, which was  extended with Potts scissors.  The Gore-Tex graft was beveled to fit the  size of the arteriotomy.  A running anastomosis was created using a  running 5-0 Prolene.  Anastomosis was then secured.  There was excellent  flow through the graft.  The graft was then reoccluded and flushed.  Next, I set up for the distal anastomosis.  Tourniquet was placed in the  upper thigh over a Webril.  The leg was exsanguinated with an Esmarch.  Tourniquet was taken to 250 mmHg and increased to 300.  An arteriotomy  in the popliteal artery was made with a #11 blade and extended with  Potts scissors.  The artery was back bleeding, and therefore I had to  place vascular clamps proximal and distal to the arteriotomy.  Potts   scissors was used to extend the arteriotomy.  The graft was cut to the  appropriate length and then the end was beveled to fit the size of the  arteriotomy.  A running anastomosis was created with 6-0 Prolene.  Prior  to completion, the tourniquet was let down.  The popliteal artery was  allowed to be flushed antegrade and retrograde.  I advanced a 3 and 4  coronary dilators down the popliteal artery without resistance.  The  graft was appropriately flushed.  Once this was done, the anastomosis  was secured.  All clamps were released.  Flow was reestablished to the  right leg.  I was able to see an excellent signal in the peroneal artery  at the ankle.  There is also monophasic posterior tibial signal at the  ankle.  At this point in time, I reverse the patient's heparin with a 50  mg of protamine.  Once hemostasis was achieved, the groin was closed in  3 layers of Vicryl and skin was closed with 4-0 Vicryl.  Dermabond was  then placed and the lower leg incision, the fascia was reapproximated  with 2-0 Vicryl.  The subcutaneous tissue closed with 3-0 Vicryl.  Skin  was closed with 4-0 Vicryl.  Dermabond was placed.  The patient was  successfully awakened from the operating room, taken to the recovery  room in stable condition.           ______________________________  V. Charlena Cross, MD  Electronically Signed     VWB/MEDQ  D:  01/24/2008  T:  01/25/2008  Job:  [id number]

## 2010-06-02 NOTE — Op Note (Signed)
NAME:  [name], [name]                ACCOUNT NO.:  [id number]   MEDICAL RECORD NO.:  [id number]          PATIENT TYPE:  AMB   LOCATION:  DAY                           FACILITY:  [facility]   PHYSICIAN:  R. Roetta Sessions, M.D. DATE OF BIRTH:  1940/11/06   DATE OF PROCEDURE:  10/13/2006  DATE OF DISCHARGE:                               OPERATIVE REPORT   PROCEDURE PERFORMED:  Diagnostic esophagogastroduodenoscopy.   INDICATIONS FOR PROCEDURE:  70 year old African American female with  nonspecific intermittent abdominal pain which she has had for years,  intermittent cramping pain with vomiting and diarrhea. CT over a period  of years implies thickening of the antrum of the stomach.  EGD is now  being done.  This approach has been discussed with the patient at  length.  The potential risks, benefits and alternatives have been  reviewed, questions answered, she is agreeable, please see documentation  in the medical record.   PROCEDURE NOTE:  O2 saturation, blood pressure, pulse rate, and  respirations were monitored throughout the entire procedure.  Conscious  sedation with Versed 2 mg IV and Demerol 50 mg IV, Cetacaine spray for  topical pharyngeal anesthesia.  Instrument Pentax video chip system.   FINDINGS:  Examination of the tubular esophagus revealed a noncritical  Schatzki's ring.  The esophageal mucosa, otherwise, appeared normal.  The EG junction was easily traversed entering the stomach.  The gastric  cavity was emptied and insufflated well with air.  A thorough  examination of the gastric mucosa including retroflex view of the  proximal stomach and esophagogastric junction demonstrated a small  hiatal hernia and there was quite a bit of rolling motility through the  antrum.  The stomach insufflated very well with air.  There were some  redundant folds but eventually I was able to see the antral mucosa very  well and there is no infiltrating process, peptic ulcer disease, or  other  abnormalities aside from possible some very fine erosions on one  of the folds, but this was basically a normal appearing gastric mucosa.  Retroflexion also revealed normal mucosa.  She did have a small hiatal  hernia.  The pylorus was patent and easily traversed.  Examination of  the bulb and second portion revealed a small volcano appearing lesion,  no more than 4 mm in diameter, in the bulb consistent with a pancreatic  rest.  Otherwise, D1 and D2 appeared normal.   IMPRESSION:  Aside from the Schatzki's ring and a hiatal hernia, this is  basically a normal upper GI tract endoscopically.   RECOMMENDATIONS:  1. Will go ahead and check for H. pylori serologies and will treat if      we come back positive.  2. She may need further evaluation of her abdominal pain.  I would      conclude changes on CT scanning are artifactual related to very      active the gastric motility. Further recommendations to follow.      Jonathon Bellows, M.D.  Electronically Signed     RMR/MEDQ  D:  10/13/2006  T:  10/13/2006  Job:  [id number]   cc:   Erle Crocker, M.D.

## 2010-06-02 NOTE — Assessment & Plan Note (Signed)
OFFICE VISIT   Alicia [name]  DOB:  13-Dec-1940                                       08/07/2007  ZOXWR#:[id number]   HISTORY:  This is a 70 year old female that I am seeing for evaluation  and management of peripheral vascular disease.  The patient states that  she has bilateral claudication, right leg being greater than the left,  at approximately 100 feet.  She also endorses what I believe to be  bilateral rest pain.  She does not have any ulcers at this time.  The  patient's risk factors include continuing use of tobacco.  She smokes  approximately a pack a week, however has smoked 2-3 packs per day.  She  is also a borderline diabetic.  She has high blood pressure as well as  hyperlipidemia, both of which she is taking medications for.   REVIEW OF SYSTEMS:  GENERAL:  There is no fevers, chills.  Positive for  significant weight loss over the past several years.  CARDIAC:  Negative for chest pain, negative shortness of breath.  PULMONARY:  No bronchitis, no coughing up blood.  GI:  Negative.  GU:  Negative.  VASCULAR:  Please see HPI.  NEURO:  Negative.  ORTHO:  Negative.  PSYCH:  Negative.  HEME:  Negative.   PAST MEDICAL HISTORY:  Borderline diabetes.  Anxiety.  Depression.  Hypertension.  Hyperlipidemia.  Low back pain.  Peptic ulcer disease.  Irritable bowel.  Migraines.  De Quervain's tenosynovitis.   PAST SURGICAL HISTORY:  Bilateral breast tumor, shoulder cyst removal,  right inguinal hernia, right femur fracture repair.   FAMILY HISTORY:  Negative for cardiovascular disease.   SOCIAL HISTORY:  She is single.  She currently smokes approximately a  pack a day.  She does not drink alcohol.   MEDICATIONS:  1. Amlodipine 1 mg per day.  2. Questionable Plavix.   ALLERGIES:  None.   PHYSICAL EXAMINATION:  Vital Signs:  Blood pressure is 130/82, pulse is  72.  General:  She is well-appearing, in no acute distress.  HEENT:  She  is  normocephalic, atraumatic.  Pupils are equal.  Sclerae are anicteric.  Neck:  Supple, there is no JVD.  Cardiovascular:  Regular rate and  rhythm.  Pulmonary:  Lungs are clear bilaterally.  Abdomen:  Soft,  nontender.  Extremities:  She has palpable femoral pulses, popliteal and  pedal pulses are nonpalpable, there is no ulceration, there is no edema.  Neuro:  Cranial nerves II-XII are grossly intact.  Psych:  She is alert  and x3.  Skin:  Without rash.   DIAGNOSTIC STUDIES:  The patient underwent CT angiogram which shows  multilevel disease.  I reviewed the CT scan today in the office..   ASSESSMENT/PLAN:  Peripheral vascular disease with bilateral lower  extremity claudication.   PLAN:  The patient will be scheduled for a bilateral lower extremity  arteriogram with the possibility of intervention.  Based on the  patient's CT scan, I would like to first begin treating her iliac  disease to see if this takes her out of possible rest pain.  Will also  need to continue with medical management which would include placing her  on aspirin as well as monitoring her cholesterol as well as her blood  sugars.  Plan on doing her arteriogram within the next  1-2 weeks.  The  risks and benefit were discussed with the patient.   Jorge Ny, MD  Electronically Signed   VWB/MEDQ  D:  08/07/2007  T:  08/08/2007  Job:  [id number]   cc:   Dr. Jen Mow

## 2010-06-02 NOTE — Assessment & Plan Note (Signed)
OFFICE VISIT   [name], [name]  DOB:  1940-08-05                                       11/06/2007  GNFAO#:[id number]   REASON FOR VISIT:  Follow-up.   HISTORY:  This is a 70 year old female that I am following for  peripheral vascular disease with bilateral claudication, right leg  greater than the left, occurring at approximately 100 feet.  She has  undergone an arteriogram, she is not a good candidate for percutaneous  revascularization due to the size of her arteries.  She has significant  disease located within her superficial femoral artery with single vessel  runoff via the peroneal artery.  She comes back today still with  complaints of pain.  She continues to smoke.   PHYSICAL EXAMINATION:  Her blood pressure is 120/73, pulse 81, she is no  acute distress.  Cardiovascular:  Regular rate, rhythm.  Her feet are  cool but perfused, there is no ulceration.  Pedal pulses are not  palpable.   DIAGNOSTIC STUDIES:  Ankle brachial index today on the right was 0.63,  on the left was 0.89.  Vein mapping was also performed which shows  suboptimal saphenous vein.   ASSESSMENT/PLAN:  Bilateral claudication.   PLAN:  I am a little reluctant to proceed with a femoral popliteal  bypass grafting as I am not 100% convinced this will alleviate the  patient's symptoms.  She definitely has a significant component of  arterial insufficiency, however she would require an above-knee bypass  with a prosthetic; I am not sure this is the right thing to do at this  time.  I am going to try to manage her conservatively with cilostazol  and pain medicine, see how she does and will see her back in 6 weeks.  If she is no better, will have to entertain proceeding with surgery.   Jorge Ny, MD  Electronically Signed   VWB/MEDQ  D:  11/06/2007  T:  11/07/2007  Job:  [id number]   cc:   Erle Crocker, M.D.

## 2010-06-02 NOTE — Assessment & Plan Note (Signed)
OFFICE VISIT   Vigna, Iesha P  DOB:  10-14-1940                                       08/12/2008  ZOXWR#:[id number]   REASON FOR VISIT:  Follow-up.   HISTORY:  This is a 70 year old female who underwent right femoral to  above-knee popliteal artery bypass graft with Propaten on 01/24/08.  This was done for her lifestyle-limiting claudication.  She has single  vessel runoff via the peroneal artery.  Her symptoms were resolved when  I saw her back in April.  She has been doing well, but then  approximately 2 months ago, she states that she has had worsening pain  in her right leg.  She comes back in today for follow-up.  She also says  that there has been a discoloration of her right second toe, which has  been there for several months and has not changed.  She also complained  of some pain in her left leg.   PHYSICAL EXAMINATION:  Blood pressure is 152/78.  Pulse is 77.  She is  well-appearing in no distress.  Cardiovascular:  Regular rate and  rhythm.  Pulmonary:  Respirations are nonlabored.  Extremities are warm  and well perfused.  Incisions are well healed.  There is a slight  discoloration of the right second toe at the level of the nailbed.  There is no evidence of infection.   DIAGNOSTIC STUDIES:  Duplex ultrasound reveals an occluded femoral  popliteal bypass graft.  Ankle brachial index is 0.76 on the right,  which is down from 0.86 on the left.  ABI is greater than 1.   ASSESSMENT:  Peripheral vascular disease.   PLAN:  I had a long conversation today with patient regarding her  options.  She states that she began having problems about 2 months ago,  which I think puts her outside the window for thrombolysis to salvage  this bypass graft.  Since she occluded her graft this early, I am  reluctant to go back and re-do this or go to her below-knee popliteal  artery for just claudication symptoms.  I discussed this with her, and  as of right  now, she just has claudication.  Therefore, I think we  should manage her medically. I am going to see her back in 6 months.  Obvious, if she develops ulcer or develops rest pain, we would consider  further distal bypass; however, without having adequate vein service  conduit, I think her options are limited from a surgical perspective.  Again, I will see her back in 6 months.   Jorge Ny, MD  Electronically Signed   VWB/MEDQ  D:  08/12/2008  T:  08/13/2008  Job:  [id number]   cc:   Erle Crocker, M.D.

## 2010-06-02 NOTE — Assessment & Plan Note (Signed)
OFFICE VISIT   Alicia [name], Alicia [name]  DOB:  07/08/1940                                       12/25/2007  [id number]#:[id number]   REASON FOR VISIT:  Right leg claudication/rest pain.   HISTORY:  A 70 year old female with peripheral vascular disease who  suffers bilateral claudication right greater than left.  Her  claudication occurs at approximately 100 feet.  She underwent an  arteriogram and due to the size of her vessels and the fact that she has  single vessel runoff via the peroneal artery, she was not a good  candidate for percutaneous revascularization.  When I last saw her we  placed her on cilostazol on an attempt at medical management.  She comes  in today.  She states that she is still having significant pain in her  right foot, she does still endorse cramping in her calves when she  walks.  She states that her foot remains very cold.   PHYSICAL EXAMINATION:  Vital Signs:  Her blood pressure is 151/83, pulse  76, respirations 16.  General:  She is well-appearing, in no distress.  Cardiovascular:  Regular rate and rhythm.  Abdomen:  Soft.  Extremities:  Cool, but perfused,   I reviewed her arteriogram, she has a diffuse disease throughout her  superficial femoral artery.  She has single vessel runoff via the  peroneal artery.  Vein mapping does not show adequate size vein.   ASSESSMENT/PLAN:  Right leg severe claudication.   Plan:  I discussed with the patient proceeding with continued medical  management versus surgical intervention.  She is eager to proceed with  an operation.  I plan on performing a right femoral-to-above-knee  popliteal artery bypass graft with Gore-Tex.  The risks and benefits of  the procedure were discussed with the patient, including infection,  cardiac, pulmonary complications and the possibility that this may not  completely resolve her foot pain.  The patient does not wish to have  this done before the holiday, and therefore  she has been scheduled for  Wednesday, January 6th.  I am going to order a Cardiolite stress test  prior to her procedure.   Jorge Ny, MD  Electronically Signed   VWB/MEDQ  D:  12/25/2007  T:  12/26/2007  Job:  [id number]   cc:   Dr. Jen Mow

## 2010-06-02 NOTE — Assessment & Plan Note (Signed)
OFFICE VISIT   Stagliano, Aribelle P  DOB:  10/21/1940                                       02/12/2008  MWUXL#:[id number]   REASON FOR VISIT:  Follow-up.   HISTORY:  This is a 70 year old female with significant right leg  claudication.  She underwent right femoral to above-knee popliteal  artery bypass with 6 mm Propaten graft on January 24, 2008.  Postoperative course was uncomplicated.  She comes back in today for  follow-up.  She states that her symptoms of pain in her foot have  resolved.  She does complain of some mild swelling in her right leg.   On examination incisions are well-healed.  She has mild edema in her  leg.   DIAGNOSTIC STUDIES:  The patient had duplex today which reveals ankle  brachial index of 0.91 on the right and 0.91 on the left.   ASSESSMENT/PLAN:  Status post right femoral to above-knee popliteal  bypass graft with Gore-Tex for lifestyle-limiting claudication.  The  patient will be placed on our bypass surveillance protocol.  I am going  to have her come back to see me in 6 months.  At that time I will also  check her carotid Dopplers as she has not yet had carotid study.   Jorge Ny, MD  Electronically Signed   VWB/MEDQ  D:  02/12/2008  T:  02/13/2008  Job:  [id number]   cc:   Dr. Jen Mow

## 2010-06-02 NOTE — Consult Note (Signed)
NAME:  Alicia [name], Alicia [name]                ACCOUNT NO.:  [id number]   MEDICAL RECORD NO.:  [id number]          PATIENT TYPE:  AMB   LOCATION:  DAY                            FACILITY:   PHYSICIAN:  R. Roetta Sessions, M.D. DATE OF BIRTH:  1940-08-12   DATE OF CONSULTATION:  10/07/2006  DATE OF DISCHARGE:                                 CONSULTATION   REASON FOR CONSULTATION:  Abdominal pain, abnormal CT scan.   HISTORY OF PRESENT ILLNESS:  The patient is a pleasant, 70 year old,  African-American female who presents for further evaluation of chronic  abdominal pain. She has had abdominal pain off and on for years. It is  quite severe at times, usually associated with vomiting and diarrhea.  She had an episode a couple of weeks ago which prompted reevaluation.  She had a CT of the abdomen and pelvis with contrast on 09/27/06. She had  small stable hepatic cysts, multiple bilateral renal cysts, questionable  gastric wall thickening at the distal antrum and pylorus and minimal  stranding of the perinephric fat which was stable. She also has a high-  attenuation nodule in the upper pole of the left kidney, questionable  high-attenuation cyst. 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 She states that she has daily  postprandial abdominal bloating and discomfort. She does not have some  heartburn-type symptoms. Denies any dysphagia or odynophagia. Her  appetite has been good. Her bowel movements occur daily. Occasionally  she has constipation or diarrhea. She denies any melena or bright red  blood per rectum. Looking back in her record she had a CT in March of  2005, which also showed distal antral pyloric wall thickening extending  into the duodenum. She denies having a prior EGD.   We have seen the patient once and that was for a screening colonoscopy  in June of 2003. She had small internal hemorrhoids with focal erythema  but otherwise unremarkable.   CURRENT MEDICATIONS:  Norvasc 10 mg daily.   ALLERGIES:  ASPIRIN.   PAST MEDICAL HISTORY:  1. Hypertension.  2. History of ear infections.  3. Hyperlipidemia.  4. Depression.  5. Anxiety.   PAST SURGICAL HISTORY:  1. She had an abdominal hernia repair in the right lower abdomen.  2. She had a tumor removed from her left shoulder.  3. She has a pin in her right hip and she has had benign tumors      removed from both breasts.   FAMILY HISTORY:  Mother and father are both deceased. Her mother died  with stroke. Father died in a house fire. She has no known family  history of colorectal cancer or chronic GI illnesses.   SOCIAL HISTORY:  She is single with no children. She is employed by [employer]  and [employer]. She smokes 2 or 3 cigarettes a day. She denies any  alcohol use.   REVIEW OF SYSTEMS:  GI: See HPI. CONSTITUTIONAL: She denies any weight  loss. CARDIOPULMONARY: Denies any chest pain, shortness of breath,  palpitations or cough. GENITOURINARY: Denies any dysuria, hematuria.  PHYSICAL EXAMINATION:  VITAL SIGNS: Weight 101, height 5 feet 1 inches,  temp 98.1, blood pressure 138/88, pulse 88.  GENERAL: Pleasant, elderly black female in no acute distress.  SKIN: Warm and dry. No jaundice.  HEENT: Sclerae nonicteric. Oropharyngeal mucosa moist and pink without  lesions, erythema or exudate.  NECK: No lymphadenopathy or thyromegaly.  CHEST: Lungs clear to auscultation.  CARDIAC: Exam reveals regular rate and rhythm. Normal S1, S2. No  murmurs, rubs or gallops.  ABDOMEN: Positive bowel sounds. The abdomen is slightly distended. The  abdomen is soft. She has mild epigastric tenderness to deep palpation.  No rebound tenderness or guarding. No abdominal bruits or hernias. No  organomegaly or masses.  EXTREMITIES: No edema.   LABORATORY DATA:  CT of the abdomen and pelvis as outlined above. In  addition, she did have old-appearing compression deformities of T10 and  T12.    IMPRESSION:  The patient is a pleasant, 70 year old lady with chronic  intermittent abdominal pain associated with severe abdominal cramping,  vomiting and diarrhea. She almost describes symptoms of intermittent  bowel obstruction except for she often has a diarrhea component. She  complains of postprandial abdominal bloating and discomfort. On recent  CT, as well as one in 2005, she had some thickening noted in the antrum  and around the pyloric channel. We question the possibility of peptic  ulcer disease and/or stricturing in this region. She denies any typical  gastroesophageal reflux disease type symptoms. Generally, her bowel  movements are regular except for these acute, severe episodes.   PLAN:  1. EGD to further evaluate abnormal CT findings. Will go ahead and      started her on Nexium 40 mg daily. Will      start FloraQ 1 daily, #30 with 1 refill.  2. Further recommendations to follow.   I want to thank Dr. Erle Crocker for allowing [place] to take part in the  care of this patient.      Alicia [name], P.AJonathon Bellows, M.D.  Electronically Signed    LL/MEDQ  D:  10/07/2006  T:  10/07/2006  Job:  [id number]   cc:   Erle Crocker, M.D.

## 2010-06-02 NOTE — Procedures (Signed)
BYPASS GRAFT EVALUATION   INDICATION:  Follow-up evaluation of lower extremity bypass graft.   HISTORY:  Diabetes:  No.  Cardiac:  No.  Hypertension:  Yes.  Smoking:  Quit.  Previous Surgery:  Right femoral-to-above-knee popliteal artery bypass  graft on 01/24/08 by Dr. Myra Gianotti.   SINGLE LEVEL ARTERIAL EXAM                               RIGHT              LEFT  Brachial:                    140                140  Anterior tibial:             108                129  Posterior tibial:            120                118  Peroneal:  Ankle/brachial index:        0.86               0.92   PREVIOUS ABI:  Date: 02/12/08  RIGHT:  0.91  LEFT:  0.91   LOWER EXTREMITY BYPASS GRAFT DUPLEX EXAM:   DUPLEX:  1. Patent right fem-pop bypass graft with no evidence of focal      stenosis.  2. Biphasic duplex waveforms noted within the graft and native      arteries with slight increased velocity of 186 cm/s at the proximal      anastomosis.   IMPRESSION:  Bilateral lower extremity ankle brachial indices suggest  mild arterial disease.        ___________________________________________  V. Charlena Cross, MD   AC/MEDQ  D:  04/29/2008  T:  04/29/2008  Job:  [id number]

## 2010-06-02 NOTE — Op Note (Signed)
NAME:  Alicia, [name]                ACCOUNT NO.:  [id number]   MEDICAL RECORD NO.:  [id number]          PATIENT TYPE:  AMB   LOCATION:  SDS                          FACILITY:  [facility]   PHYSICIAN:  VDurene Cal IV, MDDATE OF BIRTH:  02-26-1940   DATE OF PROCEDURE:  08/24/2007  DATE OF DISCHARGE:  08/24/2007                               OPERATIVE REPORT   DIAGNOSIS:  Bilateral claudication.   PROCEDURES:  1. Ultrasound access, left common femoral artery.  2. Abdominal aortogram.  3. Bilateral lower extremity runoff.   PROCEDURE:  The patient was identified in the holding area and taken to  room #8.  She was placed in supine on the table.  Bilateral groins were  prepped and draped in the standard sterile fashion.  A time-out was  called.  Lidocaine 1% was used for local anesthesia.  The left common  femoral artery was evaluated with ultrasound and found to be widely  patent.  It was accessed with an 18-gauge needle under the ultrasound  guidance.  An 0.035 wire was advanced into the aorta under the  fluoroscopic visualization.  A 5-French sheath was placed.  Over the  wire, an Omni flush catheter was a placed at the level of L1 and an  abdominal aortogram was obtained.  Next, the catheter was pulled down  the aortic bifurcation and bilateral lower extremity runoff was  obtained.   FINDINGS:  Aortogram:  Visualized portion of the suprarenal abdominal  aorta showed minimal disease.  There are single renal arteries  bilaterally.  There is no high-grade renal artery stenosis.  The  infrarenal abdominal aorta was minimally diseased.  It is small in  caliber.  Bilateral common iliac arteries are patent with minimal  disease.  Bilateral external iliac arteries are patent with minimal  disease.  Bilateral hypogastric arteries are patent.  The iliac arterial  system is small in caliber.   Right lower extremity:  The right common femoral artery is patent with  minimal disease.  The  right superficial femoral artery is patent,  however, diffusely diseased throughout its course.  The right profunda  femoral artery is patent.  The right popliteal artery is patent above  and below the knee.  The patient has single-vessel runoff via the  peroneal artery.  Collaterals fill the foot.   Left lower extremity:  The left common femoral artery is patent with  minimal disease.  The left profunda femoral artery is patent.  The left  superficial femoral artery is patent.  There is mild disease in the  adductor canal.  The popliteal artery is patent throughout its course.  There is single-vessel runoff via the peroneal artery.  There is diffuse  moderate disease throughout the tibioperoneal trunk.  Collaterals fill  the foot.   After the above images were obtained, decision was made to terminate the  procedure.  Catheters and wires were removed.  The patient was taking to  the holding area for sheath pull.   IMPRESSION:  1. Right superficial femoral artery disease in the adductor canal and  single-vessel runoff via the peroneal artery.  2. Single-vessel left leg runoff via the peroneal artery and mild      disease in the superficial femoral artery on the left.           ______________________________  V. Charlena Cross, MD  Electronically Signed     VWB/MEDQ  D:  08/24/2007  T:  08/25/2007  Job:  [id number]

## 2010-06-05 NOTE — H&P (Signed)
NAME:  KEILAH, LEMIRE                          ACCOUNT NO.:  [id number]   MEDICAL RECORD NO.:  [id number]                   PATIENT TYPE:  EMS   LOCATION:  ED                                   FACILITY:  [facility]   PHYSICIAN:  Kingsley Callander. Ouida Sills, M.D.                  DATE OF BIRTH:  1940-12-17   DATE OF ADMISSION:  04/17/2003  DATE OF DISCHARGE:  04/18/2003                                HISTORY & PHYSICAL   CHIEF COMPLAINT:  Difficulty breathing.   HISTORY OF PRESENT ILLNESS:  This patient is a 70 year old white female with  a history of congestive heart failure who presented through the emergency  room with difficulty breathing.  She has had cough with yellow sputum  production.  She denies fever.  She has experienced wheezes.  She has been a  lifelong smoker, but smokes only occasionally now.  She has had chronic  bronchitis bouts in the past.  She was treated with OmniCef earlier this  week, but has had further congestion and felt worse tonight, and was brought  to the hospital by ambulance.  She was initially quite congested and audibly  wheezing.  She was treated with inhaled bronchodilators and was treated with  IV Levaquin prior to my arrival.   PAST MEDICAL HISTORY:  1. Anemia of chronic disease.  2. Congestive heart failure.  3. Coronary artery disease.  4. Chronic renal failure.  5. Osteoporosis with a history of compression fractures.  6. History of pyloric stenosis and peptic ulcer disease.  7. Status post bilateral cataract extractions.  8. Hypothyroidism.  9. Stroke August 2003.  10.      GI arteriovenous malformations.   MEDICATIONS:  1. Lasix 20 mg daily.  2. Atenolol 25 mg daily.  3. Imdur 30 mg daily.  4. Synthroid 75 micrograms daily.  5. Prozac 10 mg daily.  6. OmniCef 300 mg b.i.d.  7. Protonix 40 mg daily.   ALLERGIES:  None.   SOCIAL HISTORY:  She is a long-term smoker.  She does not use alcohol.   FAMILY HISTORY:  Her father had an MI.  Her mother had  stomach cancer.  Two  brothers have had lung cancer.  Another brother had prostate cancer.   REVIEW OF SYSTEMS:  She has had some pleuritic-type chest pain.  She denies  vomiting, diarrhea or difficulty voiding.   PHYSICAL EXAMINATION:  VITAL SIGNS:  Temperature 97.6, blood pressure  111/53, pulse 72, respirations 24.  GENERAL:  Minimally dyspneic, alert, oriented, elderly white female.  HEENT:  Status post cataract surgery.  Oropharynx is edentulous.  NECK:  Supple with no JVD or thyromegaly.  LUNGS:  Bibasilar rhonchi.  HEART:  Regular with a systolic murmur.  ABDOMEN:  Soft, flat, nontender, no organomegaly.  EXTREMITIES:  No cyanosis, clubbing or edema.  NEUROLOGIC:  At baseline.   LABORATORY DATA:  White count 3.3, hemoglobin  9.7, platelets 174,000.  Sodium 139, potassium 3.6, BUN 27, creatinine 2.1, glucose 145, CK 37.  ABG  revealed pH of 7.37, pCO2 35, pO2 of 62.   Chest x-ray revealed atelectasis versus a small infiltrate in her right  base.   IMPRESSION:  1. Pneumonia.  Treat with IV Levaquin, supplemental oxygen and Ventolin and     Atrovent nebulizer treatments.  2. Anemia of chronic disease, stable.  3. Chronic renal insufficiency, stable.  4. Coronary artery disease/ congestive heart failure, stable. Continue     current medications.  5. Osteoporosis.  6. Hypothyroidism.  Her TSH was 2.6 last July.     ___________________________________________                                         Kingsley Callander. Ouida Sills, M.D.   ROF/MEDQ  D:  04/18/2003  T:  04/18/2003  Job:  [id number]

## 2010-06-09 ENCOUNTER — Emergency Department (HOSPITAL_COMMUNITY)
Admission: EM | Admit: 2010-06-09 | Discharge: 2010-06-09 | Disposition: A | Discharge status: Home / Self Care | Payer: PRIVATE HEALTH INSURANCE | Attending: Emergency Medicine | Admitting: Emergency Medicine

## 2010-06-09 DIAGNOSIS — Z79899 Other long term (current) drug therapy: Secondary | ICD-10-CM | POA: Insufficient documentation

## 2010-06-09 DIAGNOSIS — R220 Localized swelling, mass and lump, head: Secondary | ICD-10-CM | POA: Insufficient documentation

## 2010-06-09 DIAGNOSIS — I10 Essential (primary) hypertension: Secondary | ICD-10-CM | POA: Insufficient documentation

## 2010-06-09 DIAGNOSIS — R51 Headache: Secondary | ICD-10-CM | POA: Insufficient documentation

## 2010-06-09 DIAGNOSIS — H9209 Otalgia, unspecified ear: Secondary | ICD-10-CM | POA: Insufficient documentation

## 2010-06-18 ENCOUNTER — Ambulatory Visit (INDEPENDENT_AMBULATORY_CARE_PROVIDER_SITE_OTHER): Payer: PRIVATE HEALTH INSURANCE | Admitting: Otolaryngology

## 2010-06-18 DIAGNOSIS — H60339 Swimmer's ear, unspecified ear: Secondary | ICD-10-CM

## 2010-06-18 DIAGNOSIS — H6120 Impacted cerumen, unspecified ear: Secondary | ICD-10-CM

## 2010-06-18 DIAGNOSIS — H7200 Central perforation of tympanic membrane, unspecified ear: Secondary | ICD-10-CM

## 2010-07-02 ENCOUNTER — Other Ambulatory Visit (HOSPITAL_COMMUNITY): Payer: Self-pay | Admitting: Internal Medicine

## 2010-07-02 DIAGNOSIS — Z139 Encounter for screening, unspecified: Secondary | ICD-10-CM

## 2010-07-09 ENCOUNTER — Ambulatory Visit (INDEPENDENT_AMBULATORY_CARE_PROVIDER_SITE_OTHER): Payer: PRIVATE HEALTH INSURANCE | Admitting: Orthopedic Surgery

## 2010-07-09 ENCOUNTER — Encounter: Payer: Self-pay | Admitting: Orthopedic Surgery

## 2010-07-09 DIAGNOSIS — M5431 Sciatica, right side: Secondary | ICD-10-CM

## 2010-07-09 DIAGNOSIS — D1720 Benign lipomatous neoplasm of skin and subcutaneous tissue of unspecified limb: Secondary | ICD-10-CM

## 2010-07-09 DIAGNOSIS — M5430 Sciatica, unspecified side: Secondary | ICD-10-CM

## 2010-07-09 DIAGNOSIS — D1739 Benign lipomatous neoplasm of skin and subcutaneous tissue of other sites: Secondary | ICD-10-CM

## 2010-07-09 MED ORDER — GABAPENTIN 100 MG PO CAPS: 100.0000 mg | ORAL_CAPSULE | Freq: Three times a day (TID) | ORAL | Status: DC

## 2010-07-09 NOTE — Progress Notes (Signed)
X-ray report LEFT shoulder and bruising noted intraosseous lipoma is compared no change in position.  Impression intraosseous lipoma, stable

## 2010-07-09 NOTE — Progress Notes (Signed)
   Followup.  RIGHT shoulder lipoma needs an x-ray followup. Last x-ray, 3 months ago, RIGHT shoulder, doing well. Normal range of motion.  Pain LEFT shoulder status post cuff repair, decreased forward elevation, and painful forward elevation.  Also complains of pain down the RIGHT leg associated with numbness and tingling.  ROS: neuro per HPI, weaknes;s tired   GENERAL: normal development   CDV: pulses are normal   Skin: normal  Lymph: deferred  Psychiatric: awake, alert and oriented  Neuro: normal sensation  MSK  RIGHT shoulder again shows full range of motion and normal rate and. Joints reduced without subluxation.  LEFT shoulder forward elevation, active, 100, passive, and 130, and. Positive impingement sign.  Painful forward elevation, RIGHT leg with straight leg raise at 45 and.  X-rays were done.  Lipoma shows no change in position, and its in the glenoid area of the scapula.  Subacromial injection.  Repeat film in 6 months. RIGHT shoulder.    X-ray report LEFT shoulder and bruising noted intraosseous lipoma is compared no change in position.  Impression intraosseous lipoma, stable

## 2010-07-09 NOTE — Procedures (Signed)
LEFT shoulder subacromial space Injection LEFT knee.  Consent was obtained.  Time out was taken   LEFT Shoulderwas injected with Depo-Medrol 40 mg plus lidocaine 1% 4 cc.  Shoulder was prepped with alcohol and anesthetized with ethyl chloride.  The injection was tolerated without complication.   

## 2010-07-13 ENCOUNTER — Ambulatory Visit (HOSPITAL_COMMUNITY)
Admission: RE | Admit: 2010-07-13 | Discharge: 2010-07-13 | Disposition: A | Discharge status: Home / Self Care | Payer: PRIVATE HEALTH INSURANCE | Source: Ambulatory Visit | Attending: Internal Medicine | Admitting: Internal Medicine

## 2010-07-13 DIAGNOSIS — Z1231 Encounter for screening mammogram for malignant neoplasm of breast: Secondary | ICD-10-CM | POA: Insufficient documentation

## 2010-07-13 DIAGNOSIS — Z139 Encounter for screening, unspecified: Secondary | ICD-10-CM

## 2010-07-23 ENCOUNTER — Emergency Department (HOSPITAL_COMMUNITY)
Admission: EM | Admit: 2010-07-23 | Discharge: 2010-07-23 | Disposition: A | Discharge status: Home / Self Care | Payer: PRIVATE HEALTH INSURANCE | Attending: Emergency Medicine | Admitting: Emergency Medicine

## 2010-07-23 ENCOUNTER — Emergency Department (HOSPITAL_COMMUNITY): Payer: PRIVATE HEALTH INSURANCE

## 2010-07-23 DIAGNOSIS — M810 Age-related osteoporosis without current pathological fracture: Secondary | ICD-10-CM | POA: Insufficient documentation

## 2010-07-23 DIAGNOSIS — R1032 Left lower quadrant pain: Secondary | ICD-10-CM | POA: Insufficient documentation

## 2010-07-23 DIAGNOSIS — N390 Urinary tract infection, site not specified: Secondary | ICD-10-CM | POA: Insufficient documentation

## 2010-07-23 DIAGNOSIS — I10 Essential (primary) hypertension: Secondary | ICD-10-CM | POA: Insufficient documentation

## 2010-07-23 DIAGNOSIS — F17200 Nicotine dependence, unspecified, uncomplicated: Secondary | ICD-10-CM | POA: Insufficient documentation

## 2010-07-23 LAB — CBC
HCT: 44.3 % (ref 36.0–46.0)
Hemoglobin: 15.4 g/dL — ABNORMAL HIGH (ref 12.0–15.0)
MCH: 29.1 pg (ref 26.0–34.0)
MCHC: 34.8 g/dL (ref 30.0–36.0)
MCV: 83.6 fL (ref 78.0–100.0)
Platelets: 233 10*3/uL (ref 150–400)
RBC: 5.3 MIL/uL — ABNORMAL HIGH (ref 3.87–5.11)
RDW: 14.1 % (ref 11.5–15.5)
WBC: 11.1 10*3/uL — ABNORMAL HIGH (ref 4.0–10.5)

## 2010-07-23 LAB — COMPREHENSIVE METABOLIC PANEL
ALT: 10 U/L (ref 0–35)
AST: 15 U/L (ref 0–37)
Albumin: 4.8 g/dL (ref 3.5–5.2)
Alkaline Phosphatase: 84 U/L (ref 39–117)
BUN: 20 mg/dL (ref 6–23)
CO2: 29 mEq/L (ref 19–32)
Calcium: 9.6 mg/dL (ref 8.4–10.5)
Chloride: 96 mEq/L (ref 96–112)
Creatinine, Ser: 1.11 mg/dL — ABNORMAL HIGH (ref 0.50–1.10)
GFR calc Af Amer: 59 mL/min — ABNORMAL LOW (ref >60)
GFR calc non Af Amer: 49 mL/min — ABNORMAL LOW (ref >60)
Glucose, Bld: 137 mg/dL — ABNORMAL HIGH (ref 70–99)
Potassium: 3.8 mEq/L (ref 3.5–5.1)
Sodium: 140 mEq/L (ref 135–145)
Total Bilirubin: 0.5 mg/dL (ref 0.3–1.2)
Total Protein: 9.3 g/dL — ABNORMAL HIGH (ref 6.0–8.3)

## 2010-07-23 LAB — URINE MICROSCOPIC-ADD ON

## 2010-07-23 LAB — DIFFERENTIAL
Basophils Absolute: 0 10*3/uL (ref 0.0–0.1)
Basophils Relative: 0 % (ref 0–1)
Eosinophils Absolute: 0 10*3/uL (ref 0.0–0.7)
Eosinophils Relative: 0 % (ref 0–5)
Lymphocytes Relative: 18 % (ref 12–46)
Lymphs Abs: 2 10*3/uL (ref 0.7–4.0)
Monocytes Absolute: 0.8 10*3/uL (ref 0.1–1.0)
Monocytes Relative: 7 % (ref 3–12)
Neutro Abs: 8.3 10*3/uL — ABNORMAL HIGH (ref 1.7–7.7)
Neutrophils Relative %: 75 % (ref 43–77)

## 2010-07-23 LAB — URINALYSIS, ROUTINE W REFLEX MICROSCOPIC
Bilirubin Urine: NEGATIVE
Glucose, UA: NEGATIVE mg/dL
Leukocytes, UA: NEGATIVE
Nitrite: NEGATIVE
Protein, ur: 100 mg/dL — AB
Specific Gravity, Urine: <1.005 — ABNORMAL LOW (ref 1.005–1.030)
Urobilinogen, UA: 0.2 mg/dL (ref 0.0–1.0)
pH: 5 (ref 5.0–8.0)

## 2010-07-23 MED ORDER — IOHEXOL 300 MG/ML  SOLN
100.0000 mL | Freq: Once | INTRAMUSCULAR | Status: AC | PRN
  Administered 2010-07-23: 100 mL via INTRAVENOUS

## 2010-07-25 LAB — URINE CULTURE: Culture  Setup Time: 201207061958

## 2010-09-15 ENCOUNTER — Other Ambulatory Visit: Payer: Self-pay | Admitting: Orthopedic Surgery

## 2010-09-15 DIAGNOSIS — R52 Pain, unspecified: Secondary | ICD-10-CM

## 2010-09-15 NOTE — Telephone Encounter (Signed)
approved

## 2010-09-18 ENCOUNTER — Other Ambulatory Visit: Payer: Self-pay | Admitting: *Deleted

## 2010-09-18 MED ORDER — HYDROCODONE-ACETAMINOPHEN 5-500 MG PO TABS: 1.0000 | ORAL_TABLET | ORAL | Status: DC | PRN

## 2010-10-16 LAB — POCT I-STAT, CHEM 8
BUN: 15
Calcium, Ion: 1.2
Chloride: 107
Creatinine, Ser: 0.8
Glucose, Bld: 98
HCT: 41
Hemoglobin: 13.9
Potassium: 4
Sodium: 140
TCO2: 28

## 2010-10-29 LAB — H. PYLORI ANTIBODY, IGG: H Pylori IgG: 2.6 — ABNORMAL HIGH

## 2010-12-02 ENCOUNTER — Encounter: Payer: Self-pay | Admitting: Surgery

## 2010-12-14 ENCOUNTER — Ambulatory Visit: Payer: PRIVATE HEALTH INSURANCE | Admitting: Surgery

## 2010-12-25 ENCOUNTER — Encounter: Payer: Self-pay | Admitting: Surgery

## 2010-12-28 ENCOUNTER — Ambulatory Visit (INDEPENDENT_AMBULATORY_CARE_PROVIDER_SITE_OTHER): Payer: PRIVATE HEALTH INSURANCE | Admitting: Surgery

## 2010-12-28 ENCOUNTER — Encounter: Payer: Self-pay | Admitting: Surgery

## 2010-12-28 VITALS — BP 133/90 | HR 103 | Resp 16 | Ht 60.0 in | Wt 104.0 lb

## 2010-12-28 DIAGNOSIS — R0989 Other specified symptoms and signs involving the circulatory and respiratory systems: Secondary | ICD-10-CM

## 2010-12-28 DIAGNOSIS — M79609 Pain in unspecified limb: Secondary | ICD-10-CM

## 2010-12-28 DIAGNOSIS — T82898A Other specified complication of vascular prosthetic devices, implants and grafts, initial encounter: Secondary | ICD-10-CM
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
## 2011-01-04 ENCOUNTER — Ambulatory Visit (INDEPENDENT_AMBULATORY_CARE_PROVIDER_SITE_OTHER): Payer: PRIVATE HEALTH INSURANCE | Admitting: Orthopedic Surgery

## 2011-01-04 ENCOUNTER — Encounter: Payer: Self-pay | Admitting: Orthopedic Surgery

## 2011-01-04 VITALS — BP 124/86 | Ht 60.0 in | Wt 104.0 lb

## 2011-01-04 DIAGNOSIS — M25519 Pain in unspecified shoulder: Secondary | ICD-10-CM

## 2011-01-04 DIAGNOSIS — M75120 Complete rotator cuff tear or rupture of unspecified shoulder, not specified as traumatic: Secondary | ICD-10-CM

## 2011-01-04 DIAGNOSIS — M25511 Pain in right shoulder: Secondary | ICD-10-CM | POA: Insufficient documentation

## 2011-01-04 NOTE — Progress Notes (Signed)
Patient ID: Alicia [name], female   DOB: 28-Mar-1940, 70 y.o.   MRN: [id number] 2 cyst  She denies any constitutional symptoms she does have some difficulties with her eyes and is going for glasses with some blurred vision.  Denies headache chest pain shortness of breath heartburn frequency numbness tingling anxiety depression easy bleeding or bruising or excessive thirst.

## 2011-01-04 NOTE — Patient Instructions (Addendum)
You have received a steroid shot. 15% of patients experience increased pain at the injection site with in the next 24 hours. This is best treated with ice and tylenol extra strength 2 tabs every 8 hours. If you are still having pain please call the office.   Soak foot in epsom salt warm water   Apply Capsaizin  To foot 3 x a day.

## 2011-01-04 NOTE — Progress Notes (Signed)
Patient ID: Alicia [name], female   DOB: [birth year], 70 y.o.   MRN: [id number] History of a RIGHT glenoid cyst evaluated by MRI found to be noncancerous  Bilateral shoulder pain  Status post open treatment internal fixation RIGHT ankle with skin breakdown secondary to peripheral vascular disease.  Followed by skin graft  Complaints of inability to wear on a 3 inch heel.  She would like to injections in her RIGHT and LEFT shoulder  She would also like recommendations on her foot  Exam of the foot shows area of previous skin grafting has healed nicely.  Her range of motion however is limited at the ankle joint 10 plantar flexion 10 dorsiflexion with tenderness over the posterior tibial tendon with swelling there.  Achilles Tendon nontender retrocalcaneal bursa no swelling no tenderness.  Severely limited motion in both shoulders with crepitance bilaterally.  Weakness in the rotator cuff bilaterally.  Impression #1 bilateral shoulder pain Impression #2 bilateral rotator cuff chronic disease Impression #3 pain RIGHT foot secondary to contracture and peripheral vascular disease Impression #4 peripheral vascular disease  Plan bilateral shoulder injections Previous evaluation by podiatry recommended for orthotics and we continued that recommendation.  Injected bilateral shoulders  Shoulder Injection Procedure Note   Pre-operative Diagnosis: right  RC Syndrome  Post-operative Diagnosis: same  Indications: pain   Anesthesia: ethyl chloride   Procedure Details   Verbal consent was obtained for the procedure. The shoulder was prepped withalcohol and the skin was anesthetized. A 20 gauge needle was advanced into the subacromial space through posterior approach without difficulty  The space was then injected with 3 ml 1% lidocaine and 1 ml of depomedrol. The injection site was cleansed with isopropyl alcohol and a dressing was applied.  Complications:  None; patient tolerated the  procedure well.   Subacromial Shoulder Injection Procedure Note  Pre-operative Diagnosis: left RC Syndrome  Post-operative Diagnosis: same  Indications: pain   Anesthesia: ethyl chloride   Procedure Details   Verbal consent was obtained for the procedure. The shoulder was prepped withalcohol and the skin was anesthetized. A 20 gauge needle was advanced into the subacromial space through posterior approach without difficulty  The space was then injected with 3 ml 1% lidocaine and 1 ml of depomedrol. The injection site was cleansed with isopropyl alcohol and a dressing was applied.  Complications:  None; patient tolerated the procedure well.

## 2011-01-07 ENCOUNTER — Ambulatory Visit: Payer: PRIVATE HEALTH INSURANCE | Admitting: Orthopedic Surgery

## 2011-01-21 ENCOUNTER — Telehealth: Payer: Self-pay | Admitting: Orthopedic Surgery

## 2011-01-21 NOTE — Telephone Encounter (Signed)
Patient called to follow up on what the over the counter ointment or cream is that was mentioned at her December appointment?     Her pharmacy is [facility] if anything needs to be ordered.  Patient ph# is: [phone].

## 2011-01-22 ENCOUNTER — Encounter: Payer: Self-pay | Admitting: Surgery

## 2011-01-22 NOTE — Telephone Encounter (Signed)
OTC MED IS ASPERCREME OR MAX FREEZE

## 2011-01-22 NOTE — Telephone Encounter (Signed)
01/22/11 Called back to patient and relayed per Dr. Romeo Apple.

## 2011-01-25 ENCOUNTER — Encounter: Payer: Self-pay | Admitting: Surgery

## 2011-01-25 ENCOUNTER — Other Ambulatory Visit (INDEPENDENT_AMBULATORY_CARE_PROVIDER_SITE_OTHER): Payer: PRIVATE HEALTH INSURANCE | Admitting: *Deleted

## 2011-01-25 ENCOUNTER — Ambulatory Visit (INDEPENDENT_AMBULATORY_CARE_PROVIDER_SITE_OTHER): Payer: PRIVATE HEALTH INSURANCE | Admitting: Surgery

## 2011-01-25 VITALS — BP 144/74 | HR 108 | Resp 16 | Ht 60.0 in | Wt 106.0 lb

## 2011-01-25 DIAGNOSIS — R0989 Other specified symptoms and signs involving the circulatory and respiratory systems: Secondary | ICD-10-CM

## 2011-01-25 DIAGNOSIS — I70219 Atherosclerosis of native arteries of extremities with intermittent claudication, unspecified extremity: Secondary | ICD-10-CM

## 2011-01-25 DIAGNOSIS — M79609 Pain in unspecified limb: Secondary | ICD-10-CM

## 2011-01-25 NOTE — Progress Notes (Signed)
[facility] Specialist of [facility]   Patient name: Alicia [name] MRN: [id number] DOB: 06-01-1940 Sex: female     Chief Complaint  Patient presents with  . Follow-up    HISTORY OF PRESENT ILLNESS: The patient comes back in today for followup of her leg pain. She feels as if quarters running down her left leg. She has a history of a right femoral to above-knee popliteal artery bypass graft with Gore-Tex in January of 2010. This did not significantly improve her symptoms despite her bypass grafting patent. Unfortunately her bypass graft has occluded. She has not really had a significant change in her symptoms. She is brought back today for repeat ankle-brachial indices as well as a carotid duplex because she's had several episodes where her mind goes blank  Past Medical History  Diagnosis Date  . Allergic rhinitis   . Anxiety   . Depression   . Hyperlipidemia   . Hypertension   . Low back pain   . Peptic ulcer disease   . Irritable bowel syndrome with constipation   . Cataract   . Knee fracture   . Migraines   . Osteoarthritis   . DeQuervain's disease (tenosynovitis)   . PVD (peripheral vascular disease)   . Right rotator cuff tear     Past Surgical History  Procedure Date  . Breast tumor   . Back tumor   . Right inguinal herniorraphy   . Right femur fracture   . Right femoral to above the knee popliteal bypass   . Right ankle otif   . Pr vein bypass graft,aorto-fem-pop     History   Social History  . Marital Status: Single    Spouse Name: N/A    Number of Children: N/A  . Years of Education: N/A   Occupational History  . Not on file.   Social History Main Topics  . Smoking status: Current Everyday Smoker -- 1.0 packs/day    Types: Cigarettes  . Smokeless tobacco: Not on file  . Alcohol Use: Yes     she quit 1 year ago  . Drug Use: No  . Sexually Active: Not on file   Other Topics Concern  . Not on file   Social History Narrative  . No narrative  on file    Family History  Problem Relation Age of Onset  . Hypertension Mother   . Heart attack Mother     Allergies as of 01/25/2011 - Review Complete 01/25/2011  Allergen Reaction Noted  . Aspirin      Current Outpatient Prescriptions on File Prior to Visit  Medication Sig Dispense Refill  . ALPRAZolam (XANAX XR) 0.5 MG 24 hr tablet Take 0.5 mg by mouth as needed.        Marland Kitchen AMLODIPINE BESYLATE PO Take 10 mg by mouth daily. 1 by mouth once daily       . calcium-vitamin D (OSCAL WITH D) 500-200 MG-UNIT per tablet Take 1 tablet by mouth daily.        Jennette Banker Sodium (STOOL SOFTENER/LAXATIVE PO) Take 100 each by mouth 1 day or 1 dose.        . cetirizine (ZYRTEC) 10 MG tablet Take 10 mg by mouth daily.        . cilostazol (PLETAL) 100 MG tablet Take 100 mg by mouth 2 (two) times daily. 1 by mouth two times a day       . diphenhydrAMINE (BENADRYL) 25 mg capsule Take 25 mg by mouth every  6 (six) hours as needed. One by mouth q 4-6 hrs as needed for itching       . diphenhydrAMINE (SOMINEX) 25 MG tablet Take 25 mg by mouth at bedtime as needed.        . Fesoterodine Fumarate (TOVIAZ) 8 MG TB24 Take by mouth.        . fluticasone (FLONASE) 50 MCG/ACT nasal spray Place 2 sprays into the nose daily.        Marland Kitchen gabapentin (NEURONTIN) 100 MG capsule Take 100 mg by mouth 3 (three) times daily. 1 by mouth tid       . gabapentin (NEURONTIN) 100 MG capsule Take 1 capsule (100 mg total) by mouth 3 (three) times daily. 1-2 tabs po q 8 prn numbness  60 capsule  2  . gabapentin (NEURONTIN) 300 MG capsule Take 300 mg by mouth 3 (three) times daily. Take 1 tablet by mouth once a day       . HYDROcodone-acetaminophen (VICODIN) 5-500 MG per tablet Take 1 tablet by mouth every 4 (four) hours as needed for pain.  84 tablet  2  . losartan (COZAAR) 50 MG tablet Take 50 mg by mouth daily.        . nicotine polacrilex (NICORETTE) 4 MG gum Take 4 mg by mouth as needed.        Marland Kitchen  oxyCODONE-acetaminophen (PERCOCET) 5-325 MG per tablet Take 1 tablet by mouth every 4 (four) hours as needed. 1 by mouth q 4 as needed pain       . polyvinyl alcohol-povidone (HYPOTEARS) 1.4-0.6 % ophthalmic solution Place 1-2 drops into both eyes as needed.        Marland Kitchen PRAVASTATIN SODIUM PO Take 40 mg by mouth daily. Take one tab once daily       . pregabalin (LYRICA) 50 MG capsule Take 50 mg by mouth.        . risedronate (ACTONEL) 35 MG tablet Take 35 mg by mouth every 7 (seven) days. with water on empty stomach, nothing by mouth or lie down for next 30 minutes.       . simvastatin (ZOCOR) 20 MG tablet Take 20 mg by mouth at bedtime.        . solifenacin (VESICARE) 10 MG tablet Take 5 mg by mouth daily. Take 1 tablet by mouth once a day       . sulfamethoxazole-trimethoprim (BACTRIM DS) 800-160 MG per tablet Take 1 tablet by mouth 2 (two) times daily. 1 by mouth 2 times a day          REVIEW OF SYSTEMS: No changes from prior visit  PHYSICAL EXAMINATION:   Vital signs are BP 144/74  Pulse 108  Resp 16  Ht 5' (1.524 m)  Wt 106 lb (48.081 kg)  BMI 20.70 kg/m2  SpO2 97% General: The patient appears their stated age. HEENT:  No gross abnormalities Pulmonary:  Non labored breathing Musculoskeletal: There are no major deformities. Neurologic: No focal weakness or paresthesias are detected, Skin: There are no ulcer or rashes noted. Psychiatric: The patient has normal affect. Cardiovascular: Pedal pulses not palpable  Diagnostic Studies Carotid duplex shows 1-39% bilateral stenosis Right ankle brachial index is 27 7 left is 0.82 triphasic waveforms on the left biphasic on the right  Assessment: Bilateral leg pain Plan: I have reiterated to the patient that I do not believe her symptoms are related to arterial insufficiency. I would not recommend surgical revascularization to improve her blood flow as I do  not feel that this would improve her symptoms.  I believe they are and neuro pathic  in origin. We can consider changing her from Neurontin 2 Lyrica. I will plan on seeing her back in 6 months to see she is doing.  With regards to her mind going blind. I checked her carotid arteries appear to be widely patent. I discussed that this could be an early aspect of dementia. I will defer to her primary care physician for further management of this as well as possible medication changes to deal with her neuropathy  V. Charlena Cross, M.D. [facility] [place] Office: [phone] Pager:  [phone]

## 2011-01-26 ENCOUNTER — Other Ambulatory Visit: Payer: PRIVATE HEALTH INSURANCE

## 2011-02-08 NOTE — Procedures (Unsigned)
CAROTID DUPLEX EXAM  INDICATION:  Right carotid bruit.  HISTORY: Diabetes:  No. Cardiac:  No. Hypertension:  Yes. Smoking:  Yes. Previous Surgery:  Lower extremity graft. CV History: Amaurosis Fugax No, Paresthesias No, Hemiparesis No.                                      RIGHT             LEFT Brachial systolic pressure:         154               162 Brachial Doppler waveforms:         WNL               WNL Vertebral direction of flow:        Antegrade         Antegrade DUPLEX VELOCITIES (cm/sec) CCA peak systolic                   73                86 ECA peak systolic                   121               94 ICA peak systolic                   74                48 ICA end diastolic                   24                18 PLAQUE MORPHOLOGY:                  Heterogenous      Heterogenous PLAQUE AMOUNT:                      Mild              Mild PLAQUE LOCATION:                    ICA               ICA  IMPRESSION: 1. 1% to 39% bilateral internal carotid artery stenosis. 2. Mild soft plaquing of the left common carotid artery is observed. 3. Bilateral vertebral arteries are within normal limits.  ___________________________________________ V. Charlena Cross, MD  LT/MEDQ  D:  01/25/2011  T:  01/25/2011  Job:  [id number]

## 2011-07-19 ENCOUNTER — Ambulatory Visit: Payer: PRIVATE HEALTH INSURANCE | Admitting: Surgery

## 2011-07-26 ENCOUNTER — Ambulatory Visit: Payer: PRIVATE HEALTH INSURANCE | Admitting: Surgery

## 2011-08-09 ENCOUNTER — Other Ambulatory Visit: Payer: Self-pay | Admitting: *Deleted

## 2011-08-09 DIAGNOSIS — I70219 Atherosclerosis of native arteries of extremities with intermittent claudication, unspecified extremity: Secondary | ICD-10-CM

## 2011-08-09 DIAGNOSIS — Z48812 Encounter for surgical aftercare following surgery on the circulatory system: Secondary | ICD-10-CM

## 2011-08-12 ENCOUNTER — Ambulatory Visit (INDEPENDENT_AMBULATORY_CARE_PROVIDER_SITE_OTHER): Payer: PRIVATE HEALTH INSURANCE | Admitting: Otolaryngology

## 2011-08-12 DIAGNOSIS — H66019 Acute suppurative otitis media with spontaneous rupture of ear drum, unspecified ear: Secondary | ICD-10-CM

## 2011-08-12 DIAGNOSIS — H7200 Central perforation of tympanic membrane, unspecified ear: Secondary | ICD-10-CM

## 2011-08-12 DIAGNOSIS — H908 Mixed conductive and sensorineural hearing loss, unspecified: Secondary | ICD-10-CM

## 2011-08-13 ENCOUNTER — Encounter: Payer: Self-pay | Admitting: Surgery

## 2011-08-16 ENCOUNTER — Encounter (INDEPENDENT_AMBULATORY_CARE_PROVIDER_SITE_OTHER): Payer: PRIVATE HEALTH INSURANCE | Admitting: *Deleted

## 2011-08-16 ENCOUNTER — Encounter: Payer: Self-pay | Admitting: Surgery

## 2011-08-16 ENCOUNTER — Ambulatory Visit (INDEPENDENT_AMBULATORY_CARE_PROVIDER_SITE_OTHER): Payer: PRIVATE HEALTH INSURANCE | Admitting: Surgery

## 2011-08-16 VITALS — BP 161/93 | HR 96 | Temp 98.3°F | Ht 60.0 in | Wt 109.0 lb

## 2011-08-16 DIAGNOSIS — I70219 Atherosclerosis of native arteries of extremities with intermittent claudication, unspecified extremity: Secondary | ICD-10-CM

## 2011-08-16 DIAGNOSIS — Z48812 Encounter for surgical aftercare following surgery on the circulatory system: Secondary | ICD-10-CM

## 2011-08-16 DIAGNOSIS — I739 Peripheral vascular disease, unspecified: Secondary | ICD-10-CM

## 2011-08-16 NOTE — Progress Notes (Signed)
Vascular and Vein Specialist of [facility]   Patient name: Alicia [name] MRN: [id number] DOB: 06-24-1940 Sex: female     Chief Complaint  Patient presents with  . PVD    6 month f/u    HISTORY OF PRESENT ILLNESS: The patient comes in today for followup. At her last visit I had changed her from Neurontin 2 Lyrica. I do not feel that her symptoms are predominantly arterial in origin. She reports that her legs are not bothering her as much over the past several months. She does complain of swelling in her right leg but says that the TED hose from the hospital were too tight and she could not wear them. She does not endorse symptoms of lifestyle limiting claudication. She does not have open wounds on her legs.  Past Medical History  Diagnosis Date  . Allergic rhinitis   . Anxiety   . Depression   . Hyperlipidemia   . Hypertension   . Low back pain   . Peptic ulcer disease   . Irritable bowel syndrome with constipation   . Cataract   . Knee fracture   . Migraines   . Osteoarthritis   . DeQuervain's disease (tenosynovitis)   . PVD (peripheral vascular disease)   . Right rotator cuff tear     Past Surgical History  Procedure Date  . Breast tumor   . Back tumor   . Right inguinal herniorraphy   . Right femur fracture   . Right femoral to above the knee popliteal bypass   . Right ankle otif   . Pr vein bypass graft,aorto-fem-pop     History   Social History  . Marital Status: Single    Spouse Name: N/A    Number of Children: N/A  . Years of Education: N/A   Occupational History  . Not on file.   Social History Main Topics  . Smoking status: Current Everyday Smoker -- 1.0 packs/day    Types: Cigarettes  . Smokeless tobacco: Never Used   Comment: pt states she smokes about 3-4 cigarettes a day  . Alcohol Use: Yes     she quit 1 year ago  . Drug Use: No  . Sexually Active: Not on file   Other Topics Concern  . Not on file   Social History Narrative  . No  narrative on file    Family History  Problem Relation Age of Onset  . Hypertension Mother   . Heart attack Mother     Allergies as of 08/16/2011 - Review Complete 08/16/2011  Allergen Reaction Noted  . Aspirin      Current Outpatient Prescriptions on File Prior to Visit  Medication Sig Dispense Refill  . AMLODIPINE BESYLATE PO Take 10 mg by mouth daily. 1 by mouth once daily       . calcium-vitamin D (OSCAL WITH D) 500-200 MG-UNIT per tablet Take 1 tablet by mouth daily.        Jennette Banker Sodium (STOOL SOFTENER/LAXATIVE PO) Take 100 each by mouth 1 day or 1 dose.        . cilostazol (PLETAL) 100 MG tablet Take 100 mg by mouth 2 (two) times daily. 1 by mouth two times a day       . diphenhydrAMINE (BENADRYL) 25 mg capsule Take 25 mg by mouth every 6 (six) hours as needed. One by mouth q 4-6 hrs as needed for itching       . Fesoterodine Fumarate (TOVIAZ) 8 MG TB24 Take  by mouth.        . fluticasone (FLONASE) 50 MCG/ACT nasal spray Place 2 sprays into the nose daily.        Marland Kitchen gabapentin (NEURONTIN) 100 MG capsule Take 100 mg by mouth 3 (three) times daily. 1 by mouth tid       . HYDROcodone-acetaminophen (VICODIN) 5-500 MG per tablet Take 1 tablet by mouth every 4 (four) hours as needed for pain.  84 tablet  2  . losartan (COZAAR) 50 MG tablet Take 50 mg by mouth daily.        . mirtazapine (REMERON) 15 MG tablet Take 15 mg by mouth at bedtime.        . nicotine polacrilex (NICORETTE) 4 MG gum Take 4 mg by mouth as needed.        . risedronate (ACTONEL) 35 MG tablet Take 35 mg by mouth every 7 (seven) days. with water on empty stomach, nothing by mouth or lie down for next 30 minutes.       . simvastatin (ZOCOR) 20 MG tablet Take 20 mg by mouth at bedtime.        Marland Kitchen zolpidem (AMBIEN) 5 MG tablet Take 5 mg by mouth at bedtime as needed.      . ALPRAZolam (XANAX XR) 0.5 MG 24 hr tablet Take 0.5 mg by mouth as needed.        . cetirizine (ZYRTEC) 10 MG tablet Take 10 mg by  mouth daily.        . diphenhydrAMINE (SOMINEX) 25 MG tablet Take 25 mg by mouth at bedtime as needed.        . gabapentin (NEURONTIN) 100 MG capsule Take 1 capsule (100 mg total) by mouth 3 (three) times daily. 1-2 tabs po q 8 prn numbness  60 capsule  2  . gabapentin (NEURONTIN) 300 MG capsule Take 300 mg by mouth 3 (three) times daily. Take 1 tablet by mouth once a day       . oxyCODONE-acetaminophen (PERCOCET) 5-325 MG per tablet Take 1 tablet by mouth every 4 (four) hours as needed. 1 by mouth q 4 as needed pain       . polyvinyl alcohol-povidone (HYPOTEARS) 1.4-0.6 % ophthalmic solution Place 1-2 drops into both eyes as needed.        Marland Kitchen PRAVASTATIN SODIUM PO Take 40 mg by mouth daily. Take one tab once daily       . pregabalin (LYRICA) 50 MG capsule Take 50 mg by mouth.        . solifenacin (VESICARE) 10 MG tablet Take 5 mg by mouth daily. Take 1 tablet by mouth once a day       . sulfamethoxazole-trimethoprim (BACTRIM DS) 800-160 MG per tablet Take 1 tablet by mouth 2 (two) times daily. 1 by mouth 2 times a day          REVIEW OF SYSTEMS: No change from prior visit  PHYSICAL EXAMINATION:   Vital signs are BP 161/93  Pulse 96  Temp 98.3 F (36.8 C) (Oral)  Ht 5' (1.524 m)  Wt 109 lb (49.442 kg)  BMI 21.29 kg/m2  SpO2 100% General: The patient appears their stated age. HEENT:  No gross abnormalities Pulmonary:  Non labored breathing Musculoskeletal: There are no major deformities. Neurologic: No focal weakness or paresthesias are detected, Skin: There are no ulcer or rashes noted. Psychiatric: The patient has normal affect. Cardiovascular: Pedal pulses are nonpalpable   Diagnostic Studies ABI performed today is 0.9  for the peroneal 0.79 in the posterior tibial on the right. On the left ABI is 1.2 with triphasic waveforms. They're biphasic waveforms on the right  Assessment: Bilateral leg pain, right greater than left Plan: Overall the patient's symptoms are less  bothersome today than it had been previously. I'm not sure if this correlates with the change to Lyrica or not. Regardless, from a vascular standpoint I would not recommend any further intervention at this time. I will keep her on surveillance protocol. If she were to develop a nonhealing wound would have to entertain revascularization however hopefully she will avoid this scenario. She will come back in one year  V. Charlena Cross, M.D. [facility] [place] Office: [phone] Pager:  [phone]

## 2011-08-17 NOTE — Addendum Note (Signed)
Addended by: Sharee Pimple on: 08/17/2011 09:40 AM   Modules accepted: Orders

## 2011-09-02 ENCOUNTER — Ambulatory Visit (INDEPENDENT_AMBULATORY_CARE_PROVIDER_SITE_OTHER): Payer: PRIVATE HEALTH INSURANCE | Admitting: Otolaryngology

## 2011-09-14 ENCOUNTER — Other Ambulatory Visit (HOSPITAL_COMMUNITY): Payer: Self-pay | Admitting: Internal Medicine

## 2011-09-14 DIAGNOSIS — Z139 Encounter for screening, unspecified: Secondary | ICD-10-CM

## 2011-09-16 ENCOUNTER — Other Ambulatory Visit (HOSPITAL_COMMUNITY): Payer: Self-pay | Admitting: Internal Medicine

## 2011-09-16 ENCOUNTER — Ambulatory Visit (HOSPITAL_COMMUNITY)
Admission: RE | Admit: 2011-09-16 | Discharge: 2011-09-16 | Disposition: A | Discharge status: Home / Self Care | Payer: PRIVATE HEALTH INSURANCE | Source: Ambulatory Visit | Attending: Internal Medicine | Admitting: Internal Medicine

## 2011-09-16 DIAGNOSIS — R634 Abnormal weight loss: Secondary | ICD-10-CM

## 2011-09-16 DIAGNOSIS — Z139 Encounter for screening, unspecified: Secondary | ICD-10-CM

## 2011-09-16 DIAGNOSIS — Z1231 Encounter for screening mammogram for malignant neoplasm of breast: Secondary | ICD-10-CM | POA: Insufficient documentation

## 2011-11-12 ENCOUNTER — Telehealth: Payer: Self-pay

## 2011-11-12 ENCOUNTER — Other Ambulatory Visit: Payer: Self-pay

## 2011-11-12 DIAGNOSIS — Z139 Encounter for screening, unspecified: Secondary | ICD-10-CM

## 2011-11-12 NOTE — Telephone Encounter (Signed)
LMOM to call.

## 2011-11-12 NOTE — Telephone Encounter (Signed)
Gastroenterology Pre-Procedure Form     Request Date: 11/12/2011     Requesting Physician: Felecia Shelling     PATIENT INFORMATION:  Alicia [name] is a 71 y.o., female (DOB=[birth year]).  PROCEDURE: Procedure(s) requested: colonoscopy Procedure Reason: screening for colon cancer  PATIENT REVIEW QUESTIONS: The patient reports the following:   1. Diabetes Melitis: no 2. Joint replacements in the past 12 months: no 3. Major health problems in the past 3 months: no 4. Has an artificial valve or MVP:no 5. Has been advised in past to take antibiotics in advance of a procedure like teeth cleaning: no}    MEDICATIONS & ALLERGIES:    Patient reports the following regarding taking any blood thinners:   Plavix? no Aspirin?no Coumadin?  no  Patient confirms/reports the following medications:  Current Outpatient Prescriptions  Medication Sig Dispense Refill  . ALPRAZolam (XANAX XR) 0.5 MG 24 hr tablet Take 0.5 mg by mouth as needed.        Marland Kitchen AMLODIPINE BESYLATE PO Take 10 mg by mouth daily. 1 by mouth once daily       . calcium-vitamin D (OSCAL WITH D) 500-200 MG-UNIT per tablet Take 1 tablet by mouth daily.        Jennette Banker Sodium (STOOL SOFTENER/LAXATIVE PO) Take 100 each by mouth 1 day or 1 dose.        . cilostazol (PLETAL) 100 MG tablet Take 100 mg by mouth 2 (two) times daily. 1 by mouth two times a day       . diphenhydrAMINE (BENADRYL) 25 mg capsule Take 25 mg by mouth every 6 (six) hours as needed. One by mouth q 4-6 hrs as needed for itching       . fesoterodine (TOVIAZ) 4 MG TB24 Take 4 mg by mouth daily.      Marland Kitchen Fesoterodine Fumarate (TOVIAZ) 8 MG TB24 Take by mouth.        . gabapentin (NEURONTIN) 100 MG capsule Take 100 mg by mouth 3 (three) times daily. 1 by mouth tid       . losartan (COZAAR) 50 MG tablet Take 50 mg by mouth daily.        . mirtazapine (REMERON) 15 MG tablet Take 15 mg by mouth at bedtime.        . polyvinyl alcohol-povidone (HYPOTEARS) 1.4-0.6 %  ophthalmic solution Place 1-2 drops into both eyes as needed.        . risedronate (ACTONEL) 35 MG tablet Take 35 mg by mouth every 7 (seven) days. with water on empty stomach, nothing by mouth or lie down for next 30 minutes.      . simvastatin (ZOCOR) 20 MG tablet Take 20 mg by mouth at bedtime.        . traMADol-acetaminophen (ULTRACET) 37.5-325 MG per tablet Take 1 tablet by mouth. One tablet bid      . cetirizine (ZYRTEC) 10 MG tablet Take 10 mg by mouth daily.        . diphenhydrAMINE (SOMINEX) 25 MG tablet Take 25 mg by mouth at bedtime as needed.        . fluticasone (FLONASE) 50 MCG/ACT nasal spray Place 2 sprays into the nose daily.        Marland Kitchen gabapentin (NEURONTIN) 100 MG capsule Take 1 capsule (100 mg total) by mouth 3 (three) times daily. 1-2 tabs po q 8 prn numbness  60 capsule  2  . gabapentin (NEURONTIN) 300 MG capsule Take 100 mg by mouth at bedtime. Take  1 tablet by mouth once a day      . HYDROcodone-acetaminophen (VICODIN) 5-500 MG per tablet Take 1 tablet by mouth every 4 (four) hours as needed for pain.  84 tablet  2  . nicotine polacrilex (NICORETTE) 4 MG gum Take 4 mg by mouth as needed.        Marland Kitchen oxyCODONE-acetaminophen (PERCOCET) 5-325 MG per tablet Take 1 tablet by mouth every 4 (four) hours as needed. 1 by mouth q 4 as needed pain       . PRAVASTATIN SODIUM PO Take 40 mg by mouth daily. Take one tab once daily       . pregabalin (LYRICA) 50 MG capsule Take 50 mg by mouth.        . solifenacin (VESICARE) 10 MG tablet Take 5 mg by mouth daily. Take 1 tablet by mouth once a day       . sulfamethoxazole-trimethoprim (BACTRIM DS) 800-160 MG per tablet Take 1 tablet by mouth 2 (two) times daily. 1 by mouth 2 times a day       . zolpidem (AMBIEN) 5 MG tablet Take 5 mg by mouth at bedtime as needed.        Patient confirms/reports the following allergies:  Allergies  Allergen Reactions  . Aspirin     REACTION: stomach upset    Patient is appropriate to schedule for  requested procedure(s): yes  AUTHORIZATION INFORMATION Primary Insurance:   ID #:   Group #:  Pre-Cert / Auth required:  Pre-Cert / Auth #:   Secondary Insurance:   ID #:   Group #:  Pre-Cert / Auth required:  Pre-Cert / Auth #:   No orders of the defined types were placed in this encounter.    SCHEDULE INFORMATION: Procedure has been scheduled as follows:  Date: 12/08/2011     Time: 11:15 AM  Location: [facility] Short Stay  This Gastroenterology Pre-Precedure Form is being routed to the following provider(s) for review: R. Roetta Sessions, MD

## 2011-11-15 ENCOUNTER — Telehealth: Payer: Self-pay

## 2011-11-15 MED ORDER — PEG-KCL-NACL-NASULF-NA ASC-C 100 G PO SOLR: 1.0000 | ORAL | Status: DC

## 2011-11-15 NOTE — Telephone Encounter (Signed)
OK to schedule

## 2011-11-15 NOTE — Telephone Encounter (Signed)
Rx sent to [facility] and instructions mailed to pt.  

## 2011-11-15 NOTE — Telephone Encounter (Signed)
Disregard this triage. Updated on 11/15/2011 to updated meds.

## 2011-11-15 NOTE — Telephone Encounter (Signed)
Gastroenterology Pre-Procedure Form     Request Date: 11/15/2011      Requesting Physician: Dr. Felecia Shelling     PATIENT INFORMATION:  Alicia [name] is a 71 y.o., female (DOB=Mar 08, 1940).  PROCEDURE: Procedure(s) requested: colonoscopy Procedure Reason: screening for colon cancer  PATIENT REVIEW QUESTIONS: The patient reports the following:   1. Diabetes Melitis: no 2. Joint replacements in the past 12 months: no 3. Major health problems in the past 3 months: no 4. Has an artificial valve or MVP:no 5. Has been advised in past to take antibiotics in advance of a procedure like teeth cleaning: no}    MEDICATIONS & ALLERGIES:    Patient reports the following regarding taking any blood thinners:   Plavix? no Aspirin?no Coumadin?  no  Patient confirms/reports the following medications:  Current Outpatient Prescriptions  Medication Sig Dispense Refill  . AMLODIPINE BESYLATE PO Take 10 mg by mouth daily. 1 by mouth once daily       . calcium-vitamin D (OSCAL WITH D) 500-200 MG-UNIT per tablet Take 1 tablet by mouth daily.        Jennette Banker Sodium (STOOL SOFTENER/LAXATIVE PO) Take 100 each by mouth 1 day or 1 dose.        . cilostazol (PLETAL) 100 MG tablet Take 100 mg by mouth 2 (two) times daily.       . diphenhydrAMINE (BENADRYL) 25 mg capsule Take 25 mg by mouth every 6 (six) hours as needed. One by mouth q 4-6 hrs as needed for itching       . fesoterodine (TOVIAZ) 4 MG TB24 Take 4 mg by mouth daily.      Marland Kitchen gabapentin (NEURONTIN) 100 MG capsule Take 100 mg by mouth at bedtime.       Marland Kitchen losartan (COZAAR) 50 MG tablet Take 50 mg by mouth daily.        . mirtazapine (REMERON) 15 MG tablet Take 15 mg by mouth at bedtime.        . Multiple Vitamin (MULTIVITAMIN) tablet Take 1 tablet by mouth daily.      . polyvinyl alcohol-povidone (HYPOTEARS) 1.4-0.6 % ophthalmic solution Place 1-2 drops into both eyes as needed.        . risedronate (ACTONEL) 35 MG tablet Take 35 mg by mouth  every 7 (seven) days. with water on empty stomach, nothing by mouth or lie down for next 30 minutes.      . simvastatin (ZOCOR) 20 MG tablet Take 20 mg by mouth at bedtime.        . traMADol-acetaminophen (ULTRACET) 37.5-325 MG per tablet Take 1 tablet by mouth. One tablet bid      . DISCONTD: gabapentin (NEURONTIN) 100 MG capsule Take 1 capsule (100 mg total) by mouth 3 (three) times daily. 1-2 tabs po q 8 prn numbness  60 capsule  2  . DISCONTD: gabapentin (NEURONTIN) 300 MG capsule Take 100 mg by mouth at bedtime. Take 1 tablet by mouth once a day        Patient confirms/reports the following allergies:  Allergies  Allergen Reactions  . Aspirin     REACTION: stomach upset    Patient is appropriate to schedule for requested procedure(s): yes  AUTHORIZATION INFORMATION Primary Insurance:   ID #: Group #:  Pre-Cert / Auth required:  Pre-Cert / Auth #:   Secondary Insurance: ID #:   Group #:  Pre-Cert / Auth required:  Pre-Cert / Auth #:  No orders of the defined types were  placed in this encounter.    SCHEDULE INFORMATION: Procedure has been scheduled as follows:  Date: 12/08/2011     Time: 11:15 AM  Location: [facility] Short Stay  This Gastroenterology Pre-Precedure Form is being routed to the following provider(s) for review: R. Roetta Sessions, MD

## 2011-11-29 ENCOUNTER — Encounter (HOSPITAL_COMMUNITY): Payer: Self-pay | Admitting: Pharmacy Technician

## 2011-12-06 ENCOUNTER — Telehealth: Payer: Self-pay

## 2011-12-06 NOTE — Telephone Encounter (Signed)
Pt called to review instructions of prep. Also wanted to know did she have to have the enema, said she didn't have any money. I told her IT IS REQUIRED.  She said she will borrow some money to get the enema.

## 2011-12-07 MED ORDER — SODIUM CHLORIDE 0.45 % IV SOLN
INTRAVENOUS | Status: DC
  Administered 2011-12-08: 11:00:00 via INTRAVENOUS

## 2011-12-08 ENCOUNTER — Encounter (HOSPITAL_COMMUNITY)
Admission: RE | Disposition: A | Discharge status: Home / Self Care | Payer: Self-pay | Source: Ambulatory Visit | Attending: Internal Medicine

## 2011-12-08 ENCOUNTER — Ambulatory Visit (HOSPITAL_COMMUNITY)
Admission: RE | Admit: 2011-12-08 | Discharge: 2011-12-08 | Disposition: A | Discharge status: Home / Self Care | Payer: Medicare PPO | Source: Ambulatory Visit | Attending: Internal Medicine | Admitting: Internal Medicine

## 2011-12-08 ENCOUNTER — Encounter (HOSPITAL_COMMUNITY): Payer: Self-pay | Admitting: *Deleted

## 2011-12-08 DIAGNOSIS — Z1211 Encounter for screening for malignant neoplasm of colon: Secondary | ICD-10-CM

## 2011-12-08 DIAGNOSIS — I10 Essential (primary) hypertension: Secondary | ICD-10-CM | POA: Insufficient documentation

## 2011-12-08 DIAGNOSIS — Z139 Encounter for screening, unspecified: Secondary | ICD-10-CM

## 2011-12-08 HISTORY — PX: COLONOSCOPY: SHX5424

## 2011-12-08 SURGERY — COLONOSCOPY
Anesthesia: Moderate Sedation

## 2011-12-08 MED ORDER — STERILE WATER FOR IRRIGATION IR SOLN
Status: DC | PRN
  Administered 2011-12-08: 11:00:00

## 2011-12-08 MED ORDER — MIDAZOLAM HCL 5 MG/5ML IJ SOLN
INTRAMUSCULAR | Status: DC | PRN
  Administered 2011-12-08 (×3): 1 mg via INTRAVENOUS
  Administered 2011-12-08: 2 mg via INTRAVENOUS

## 2011-12-08 MED ORDER — MEPERIDINE HCL 100 MG/ML IJ SOLN
INTRAMUSCULAR | Status: DC | PRN
  Administered 2011-12-08: 50 mg via INTRAVENOUS

## 2011-12-08 MED ORDER — MIDAZOLAM HCL 5 MG/5ML IJ SOLN
INTRAMUSCULAR | Status: AC
  Filled 2011-12-08: qty 10

## 2011-12-08 MED ORDER — MEPERIDINE HCL 100 MG/ML IJ SOLN
INTRAMUSCULAR | Status: AC
  Filled 2011-12-08: qty 2

## 2011-12-08 NOTE — Op Note (Signed)
[facility] [address] [place], [address]   COLONOSCOPY PROCEDURE REPORT  PATIENT: Alicia [name], Alicia [name]  MR#:         [id number] BIRTHDATE: 1940/08/24 , 71  yrs. old GENDER: Female ENDOSCOPIST: R.  Roetta Sessions, MD FACP FACG REFERRED BY:  Glenice Laine, M.D. PROCEDURE DATE:  12/08/2011 PROCEDURE:    Screening colonoscopy INDICATIONS: average risk colorectal cancer screening  INFORMED CONSENT:  The risks, benefits, alternatives and imponderables including but not limited to bleeding, perforation as well as the possibility of a missed lesion have been reviewed.  The potential for biopsy, lesion removal, etc. have also been discussed.  Questions have been answered.  All parties agreeable. Please see the history and physical in the medical record for more information.  MEDICATIONS: Versed 2 mg IV and Demerol 50 mg in divided doses  DESCRIPTION OF PROCEDURE:  After a digital rectal exam was performed, the Pentax Colonoscope [id number]  colonoscope was advanced from the anus through the rectum and colon to the area of the cecum, ileocecal valve and appendiceal orifice.  The cecum was deeply intubated.  These structures were well-seen and photographed for the record.  From the level of the cecum and ileocecal valve, the scope was slowly and cautiously withdrawn.  The mucosal surfaces were carefully surveyed utilizing scope tip deflection to facilitate fold flattening as needed.  The scope was pulled down into the rectum where a thorough examination including retroflexion was performed.    FINDINGS:  excellent preparation. Normal rectum. Normal colon.  THERAPEUTIC / DIAGNOSTIC MANEUVERS PERFORMED:  None  COMPLICATIONS: None  CECAL WITHDRAWAL TIME:  8 minutes  IMPRESSION:  Normal colonoscopy  RECOMMENDATIONS: Consider one more screening colonoscopy in 10 years if overall health permits.   _______________________________ eSigned:  R. Roetta Sessions, MD FACP  [facility] 12/08/2011 11:46 AM   CC:    PATIENT NAME:  Alicia [name], Alicia [name] MR#: [id number]

## 2011-12-08 NOTE — H&P (Signed)
Primary Care Physician:  Avon Gully, MD Primary Gastroenterologist:  Dr. Jena Gauss  Pre-Procedure History & Physical: HPI:  Alicia [name] is a 71 y.o. female is here for a screening colonoscopy. Negative colonoscopy reportedly 10 years. No GI symptoms. No family history colon polyps or colon cancer.  Past Medical History  Diagnosis Date  . Allergic rhinitis   . Anxiety   . Depression   . Hyperlipidemia   . Hypertension   . Low back pain   . Peptic ulcer disease   . Irritable bowel syndrome with constipation   . Cataract   . Knee fracture   . Migraines   . Osteoarthritis   . DeQuervain's disease (tenosynovitis)   . PVD (peripheral vascular disease)   . Right rotator cuff tear     Past Surgical History  Procedure Date  . Breast tumor   . Back tumor   . Right inguinal herniorraphy   . Right femur fracture   . Right femoral to above the knee popliteal bypass   . Right ankle otif   . Pr vein bypass graft,aorto-fem-pop     Prior to Admission medications   Medication Sig Start Date End Date Taking? Authorizing Provider  amLODipine (NORVASC) 10 MG tablet Take 10 mg by mouth daily.   Yes Historical Provider, MD  calcium-vitamin D (OSCAL WITH D) 500-200 MG-UNIT per tablet Take 1 tablet by mouth daily.     Yes Historical Provider, MD  cilostazol (PLETAL) 100 MG tablet Take 100 mg by mouth 2 (two) times daily.    Yes Historical Provider, MD  fesoterodine (TOVIAZ) 4 MG TB24 Take 4 mg by mouth daily.   Yes Historical Provider, MD  gabapentin (NEURONTIN) 100 MG capsule Take 100 mg by mouth at bedtime.    Yes Historical Provider, MD  losartan (COZAAR) 50 MG tablet Take 50 mg by mouth daily.     Yes Historical Provider, MD  mirtazapine (REMERON) 15 MG tablet Take 15 mg by mouth at bedtime.     Yes Historical Provider, MD  Multiple Vitamin (MULTIVITAMIN) tablet Take 1 tablet by mouth daily.   Yes Historical Provider, MD  polyvinyl alcohol-povidone (HYPOTEARS) 1.4-0.6 % ophthalmic  solution Place 1-2 drops into both eyes as needed. Dry eyes.   Yes Historical Provider, MD  simvastatin (ZOCOR) 20 MG tablet Take 20 mg by mouth at bedtime.     Yes Historical Provider, MD  traMADol-acetaminophen (ULTRACET) 37.5-325 MG per tablet Take 1 tablet by mouth 2 (two) times daily.    Yes Historical Provider, MD  zolpidem (AMBIEN) 5 MG tablet Take 1 tablet by mouth at bedtime. 08/24/11  Yes Historical Provider, MD  Casanthranol-Docusate Sodium (STOOL SOFTENER/LAXATIVE PO) Take 100 each by mouth 1 day or 1 dose.      Historical Provider, MD  peg 3350 powder (MOVIPREP) 100 G SOLR Take 1 kit (100 g total) by mouth as directed. 11/15/11   Corbin Ade, MD  risedronate (ACTONEL) 35 MG tablet Take 35 mg by mouth every 7 (seven) days. with water on empty stomach, nothing by mouth or lie down for next 30 minutes.    Historical Provider, MD    Allergies as of 11/12/2011 - Review Complete 11/12/2011  Allergen Reaction Noted  . Aspirin      Family History  Problem Relation Age of Onset  . Hypertension Mother   . Heart attack Mother   . Colon cancer Neg Hx     History   Social History  . Marital Status:  Single    Spouse Name: N/A    Number of Children: N/A  . Years of Education: N/A   Occupational History  . Not on file.   Social History Main Topics  . Smoking status: Current Every Day Smoker -- 1.0 packs/day for 52 years    Types: Cigarettes  . Smokeless tobacco: Never Used     Comment: pt states she smokes about 3-4 cigarettes a day  . Alcohol Use: No     Comment: she quit 1 year ago  . Drug Use: No  . Sexually Active: Not on file   Other Topics Concern  . Not on file   Social History Narrative  . No narrative on file    Review of Systems: See HPI, otherwise negative ROS  Physical Exam: BP 130/85  Pulse 105  Temp 98.1 F (36.7 C) (Oral)  Resp 20  Ht 5' (1.524 m)  Wt 109 lb (49.442 kg)  BMI 21.29 kg/m2  SpO2 98% General:   Alert,  Well-developed,  well-nourished, pleasant and cooperative in NAD Head:  Normocephalic and atraumatic. Eyes:  Sclera clear, no icterus.   Conjunctiva pink. Ears:  Normal auditory acuity. Nose:  No deformity, discharge,  or lesions. Mouth:  No deformity or lesions, dentition normal. Neck:  Supple; no masses or thyromegaly. Lungs:  Clear throughout to auscultation.   No wheezes, crackles, or rhonchi. No acute distress. Heart:  Regular rate and rhythm; no murmurs, clicks, rubs,  or gallops. Abdomen:  Soft, nontender and nondistended. No masses, hepatosplenomegaly or hernias noted. Normal bowel sounds, without guarding, and without rebound.   Msk:  Symmetrical without gross deformities. Normal posture. Pulses:  Normal pulses noted. Extremities:  Without clubbing or edema. Neurologic:  Alert and  oriented x4;  grossly normal neurologically. Skin:  Intact without significant lesions or rashes. Cervical Nodes:  No significant cervical adenopathy. Psych:  Alert and cooperative. Normal mood and affect.  Impression/Plan: Alicia [name] is now here to undergo a screening colonoscopy.  Average risk screening examination.  Risks, benefits, limitations, imponderables and alternatives regarding colonoscopy have been reviewed with the patient. Questions have been answered. All parties agreeable.

## 2011-12-10 ENCOUNTER — Encounter (HOSPITAL_COMMUNITY): Payer: Self-pay | Admitting: Internal Medicine

## 2012-02-28 ENCOUNTER — Encounter (HOSPITAL_COMMUNITY): Payer: Self-pay | Admitting: Pharmacy Technician

## 2012-03-08 ENCOUNTER — Encounter (HOSPITAL_COMMUNITY): Payer: Self-pay

## 2012-03-08 ENCOUNTER — Other Ambulatory Visit: Payer: Self-pay

## 2012-03-08 ENCOUNTER — Encounter (HOSPITAL_COMMUNITY)
Admission: RE | Admit: 2012-03-08 | Discharge: 2012-03-08 | Disposition: A | Discharge status: Home / Self Care | Payer: Medicare PPO | Source: Ambulatory Visit | Attending: Ophthalmology | Admitting: Ophthalmology

## 2012-03-08 HISTORY — DX: Chronic obstructive pulmonary disease, unspecified: J44.9

## 2012-03-08 LAB — BASIC METABOLIC PANEL
BUN: 11 mg/dL (ref 6–23)
CO2: 28 mEq/L (ref 19–32)
Calcium: 9.8 mg/dL (ref 8.4–10.5)
Chloride: 104 mEq/L (ref 96–112)
Creatinine, Ser: 0.72 mg/dL (ref 0.50–1.10)
GFR calc Af Amer: >90 mL/min (ref >90)
GFR calc non Af Amer: 84 mL/min — ABNORMAL LOW (ref >90)
Glucose, Bld: 108 mg/dL — ABNORMAL HIGH (ref 70–99)
Potassium: 4.3 mEq/L (ref 3.5–5.1)
Sodium: 140 mEq/L (ref 135–145)

## 2012-03-08 LAB — HEMOGLOBIN AND HEMATOCRIT, BLOOD
HCT: 39 % (ref 36.0–46.0)
Hemoglobin: 12.9 g/dL (ref 12.0–15.0)

## 2012-03-08 NOTE — Patient Instructions (Signed)
Your procedure is scheduled on:  Monday, 03/13/12  Report to [facility] at   [place]     AM.  Call this number if you have problems the morning of surgery: [phone]   Remember:   Do not eat or drink   After Midnight.  Take these medicines the morning of surgery with A SIP OF WATER: amlodipine, losartan, and ultracet if needed   Do not wear jewelry, make-up or nail polish.  Do not wear lotions, powders, or perfumes. You may wear deodorant.  Do not bring valuables to the hospital.  Contacts, dentures or bridgework may not be worn into surgery.     Patients discharged the day of surgery will not be allowed to drive home.  Name and phone number of your driver: driver  Special Instructions: Use eye drops as directed.   Please read over the following fact sheets that you were given: Pain Booklet, Anesthesia Post-op Instructions and Care and Recovery After Surgery    Cataract Surgery  A cataract is a clouding of the lens of the eye. When a lens becomes cloudy, vision is reduced based on the degree and nature of the clouding. Surgery may be needed to improve vision. Surgery removes the cloudy lens and usually replaces it with a substitute lens (intraocular lens, IOL). LET YOUR EYE DOCTOR KNOW ABOUT:  Allergies to food or medicine.   Medicines taken including herbs, eyedrops, over-the-counter medicines, and creams.   Use of steroids (by mouth or creams).   Previous problems with anesthetics or numbing medicine.   History of bleeding problems or blood clots.   Previous surgery.   Other health problems, including diabetes and kidney problems.   Possibility of pregnancy, if this applies.  RISKS AND COMPLICATIONS  Infection.   Inflammation of the eyeball (endophthalmitis) that can spread to both eyes (sympathetic ophthalmia).   Poor wound healing.   If an IOL is inserted, it can later fall out of proper position. This is very uncommon.   Clouding of the part of your eye that holds  an IOL in place. This is called an "after-cataract." These are uncommon, but easily treated.  BEFORE THE PROCEDURE  Do not eat or drink anything except small amounts of water for 8 to 12 before your surgery, or as directed by your caregiver.   Unless you are told otherwise, continue any eyedrops you have been prescribed.   Talk to your primary caregiver about all other medicines that you take (both prescription and non-prescription). In some cases, you may need to stop or change medicines near the time of your surgery. This is most important if you are taking blood-thinning medicine.Do not stop medicines unless you are told to do so.   Arrange for someone to drive you to and from the procedure.   Do not put contact lenses in either eye on the day of your surgery.  PROCEDURE There is more than one method for safely removing a cataract. Your doctor can explain the differences and help determine which is best for you. Phacoemulsification surgery is the most common form of cataract surgery.  An injection is given behind the eye or eyedrops are given to make this a painless procedure.   A small cut (incision) is made on the edge of the clear, dome-shaped surface that covers the front of the eye (cornea).   A tiny probe is painlessly inserted into the eye. This device gives off ultrasound waves that soften and break up the cloudy center of  the lens. This makes it easier for the cloudy lens to be removed by suction.   An IOL may be implanted.   The normal lens of the eye is covered by a clear capsule. Part of that capsule is intentionally left in the eye to support the IOL.   Your surgeon may or may not use stitches to close the incision.  There are other forms of cataract surgery that require a larger incision and stiches to close the eye. This approach is taken in cases where the doctor feels that the cataract cannot be easily removed using phacoemulsification. AFTER THE PROCEDURE  When an  IOL is implanted, it does not need care. It becomes a permanent part of your eye and cannot be seen or felt.   Your doctor will schedule follow-up exams to check on your progress.   Review your other medicines with your doctor to see which can be resumed after surgery.   Use eyedrops or take medicine as prescribed by your doctor.  Document Released: 12/24/2010 Document Reviewed: 12/21/2010 [facility] Patient Information 2012 [place], [place].  PATIENT INSTRUCTIONS POST-ANESTHESIA  IMMEDIATELY FOLLOWING SURGERY:  Do not drive or operate machinery for the first twenty four hours after surgery.  Do not make any important decisions for twenty four hours after surgery or while taking narcotic pain medications or sedatives.  If you develop intractable nausea and vomiting or a severe headache please notify your doctor immediately.  FOLLOW-UP:  Please make an appointment with your surgeon as instructed. You do not need to follow up with anesthesia unless specifically instructed to do so.  WOUND CARE INSTRUCTIONS (if applicable):  Keep a dry clean dressing on the anesthesia/puncture wound site if there is drainage.  Once the wound has quit draining you may leave it open to air.  Generally you should leave the bandage intact for twenty four hours unless there is drainage.  If the epidural site drains for more than 36-48 hours please call the anesthesia department.  QUESTIONS?:  Please feel free to call your physician or the hospital operator if you have any questions, and they will be happy to assist you.

## 2012-03-10 MED ORDER — PHENYLEPHRINE HCL 2.5 % OP SOLN
OPHTHALMIC | Status: AC
  Filled 2012-03-10: qty 2

## 2012-03-10 MED ORDER — LIDOCAINE HCL 3.5 % OP GEL
OPHTHALMIC | Status: AC
  Filled 2012-03-10: qty 5

## 2012-03-10 MED ORDER — CYCLOPENTOLATE-PHENYLEPHRINE 0.2-1 % OP SOLN
OPHTHALMIC | Status: AC
  Filled 2012-03-10: qty 2

## 2012-03-10 MED ORDER — NEOMYCIN-POLYMYXIN-DEXAMETH 3.5-10000-0.1 OP OINT
TOPICAL_OINTMENT | OPHTHALMIC | Status: AC
  Filled 2012-03-10: qty 3.5

## 2012-03-10 MED ORDER — LIDOCAINE HCL (PF) 1 % IJ SOLN
INTRAMUSCULAR | Status: AC
  Filled 2012-03-10: qty 2

## 2012-03-10 MED ORDER — TETRACAINE HCL 0.5 % OP SOLN
OPHTHALMIC | Status: AC
  Filled 2012-03-10: qty 2

## 2012-03-13 ENCOUNTER — Ambulatory Visit (HOSPITAL_COMMUNITY)
Admission: RE | Admit: 2012-03-13 | Discharge: 2012-03-13 | Disposition: A | Discharge status: Home Health | Payer: Medicare PPO | Source: Ambulatory Visit | Attending: Ophthalmology | Admitting: Ophthalmology

## 2012-03-13 ENCOUNTER — Encounter (HOSPITAL_COMMUNITY)
Admission: RE | Disposition: A | Discharge status: Home Health | Payer: Self-pay | Source: Ambulatory Visit | Attending: Ophthalmology

## 2012-03-13 ENCOUNTER — Encounter (HOSPITAL_COMMUNITY): Payer: Self-pay | Admitting: *Deleted

## 2012-03-13 ENCOUNTER — Encounter (HOSPITAL_COMMUNITY): Payer: Self-pay | Admitting: Anesthesiology

## 2012-03-13 ENCOUNTER — Ambulatory Visit (HOSPITAL_COMMUNITY): Payer: Medicare PPO | Admitting: Anesthesiology

## 2012-03-13 DIAGNOSIS — I10 Essential (primary) hypertension: Secondary | ICD-10-CM | POA: Insufficient documentation

## 2012-03-13 DIAGNOSIS — J4489 Other specified chronic obstructive pulmonary disease: Secondary | ICD-10-CM | POA: Insufficient documentation

## 2012-03-13 DIAGNOSIS — J449 Chronic obstructive pulmonary disease, unspecified: Secondary | ICD-10-CM | POA: Insufficient documentation

## 2012-03-13 DIAGNOSIS — H2510 Age-related nuclear cataract, unspecified eye: Secondary | ICD-10-CM | POA: Insufficient documentation

## 2012-03-13 HISTORY — PX: CATARACT EXTRACTION W/PHACO: SHX586

## 2012-03-13 SURGERY — PHACOEMULSIFICATION, CATARACT, WITH IOL INSERTION
Anesthesia: Monitor Anesthesia Care | Site: Eye | Laterality: Right | Wound class: Clean

## 2012-03-13 MED ORDER — LIDOCAINE HCL 3.5 % OP GEL
OPHTHALMIC | Status: AC
  Filled 2012-03-13: qty 5

## 2012-03-13 MED ORDER — LIDOCAINE HCL (PF) 1 % IJ SOLN
INTRAMUSCULAR | Status: DC | PRN
  Administered 2012-03-13: .6 mL

## 2012-03-13 MED ORDER — BSS IO SOLN
INTRAOCULAR | Status: DC | PRN
  Administered 2012-03-13: 15 mL via INTRAOCULAR

## 2012-03-13 MED ORDER — LIDOCAINE HCL 3.5 % OP GEL: 1.0000 "application " | Freq: Once | OPHTHALMIC | Status: DC

## 2012-03-13 MED ORDER — EPINEPHRINE HCL 1 MG/ML IJ SOLN
INTRAMUSCULAR | Status: AC
  Filled 2012-03-13: qty 1

## 2012-03-13 MED ORDER — NEOMYCIN-POLYMYXIN-DEXAMETH 0.1 % OP OINT
TOPICAL_OINTMENT | OPHTHALMIC | Status: DC | PRN
  Administered 2012-03-13: 1 via OPHTHALMIC

## 2012-03-13 MED ORDER — EPINEPHRINE HCL 1 MG/ML IJ SOLN
INTRAOCULAR | Status: DC | PRN
  Administered 2012-03-13: 07:00:00

## 2012-03-13 MED ORDER — LACTATED RINGERS IV SOLN
INTRAVENOUS | Status: DC
  Administered 2012-03-13: 07:00:00 via INTRAVENOUS

## 2012-03-13 MED ORDER — CYCLOPENTOLATE-PHENYLEPHRINE 0.2-1 % OP SOLN
1.0000 [drp] | OPHTHALMIC | Status: AC
  Administered 2012-03-13 (×3): 1 [drp] via OPHTHALMIC

## 2012-03-13 MED ORDER — MIDAZOLAM HCL 2 MG/2ML IJ SOLN
INTRAMUSCULAR | Status: AC
  Filled 2012-03-13: qty 2

## 2012-03-13 MED ORDER — PROVISC 10 MG/ML IO SOLN
INTRAOCULAR | Status: DC | PRN
  Administered 2012-03-13: 8.5 mg via INTRAOCULAR

## 2012-03-13 MED ORDER — PHENYLEPHRINE HCL 2.5 % OP SOLN
1.0000 [drp] | OPHTHALMIC | Status: AC
  Administered 2012-03-13 (×3): 1 [drp] via OPHTHALMIC

## 2012-03-13 MED ORDER — POVIDONE-IODINE 5 % OP SOLN
OPHTHALMIC | Status: DC | PRN
  Administered 2012-03-13: 1 via OPHTHALMIC

## 2012-03-13 MED ORDER — LIDOCAINE 3.5 % OP GEL OPTIME - NO CHARGE
OPHTHALMIC | Status: DC | PRN
  Administered 2012-03-13: 1 [drp] via OPHTHALMIC

## 2012-03-13 MED ORDER — MIDAZOLAM HCL 2 MG/2ML IJ SOLN
1.0000 mg | INTRAMUSCULAR | Status: DC | PRN
  Administered 2012-03-13: 2 mg via INTRAVENOUS

## 2012-03-13 MED ORDER — TETRACAINE HCL 0.5 % OP SOLN
1.0000 [drp] | OPHTHALMIC | Status: AC
  Administered 2012-03-13 (×3): 1 [drp] via OPHTHALMIC

## 2012-03-13 SURGICAL SUPPLY — 31 items
CAPSULAR TENSION RING-AMO (OPHTHALMIC RELATED) IMPLANT
CLOTH BEACON ORANGE TIMEOUT ST (SAFETY) ×1 IMPLANT
EYE SHIELD UNIVERSAL CLEAR (GAUZE/BANDAGES/DRESSINGS) ×1 IMPLANT
GLOVE BIO SURGEON STRL SZ 6.5 (GLOVE) IMPLANT
GLOVE BIOGEL PI IND STRL 6.5 (GLOVE) IMPLANT
GLOVE BIOGEL PI IND STRL 7.0 (GLOVE) IMPLANT
GLOVE BIOGEL PI IND STRL 7.5 (GLOVE) IMPLANT
GLOVE BIOGEL PI INDICATOR 6.5 (GLOVE) ×1
GLOVE BIOGEL PI INDICATOR 7.0 (GLOVE)
GLOVE BIOGEL PI INDICATOR 7.5 (GLOVE)
GLOVE ECLIPSE 6.5 STRL STRAW (GLOVE) IMPLANT
GLOVE ECLIPSE 7.0 STRL STRAW (GLOVE) IMPLANT
GLOVE ECLIPSE 7.5 STRL STRAW (GLOVE) IMPLANT
GLOVE EXAM NITRILE LRG STRL (GLOVE) IMPLANT
GLOVE EXAM NITRILE MD LF STRL (GLOVE) ×1 IMPLANT
GLOVE SKINSENSE NS SZ6.5 (GLOVE)
GLOVE SKINSENSE NS SZ7.0 (GLOVE)
GLOVE SKINSENSE STRL SZ6.5 (GLOVE) IMPLANT
GLOVE SKINSENSE STRL SZ7.0 (GLOVE) IMPLANT
KIT VITRECTOMY (OPHTHALMIC RELATED) IMPLANT
PAD ARMBOARD 7.5X6 YLW CONV (MISCELLANEOUS) ×1 IMPLANT
PROC W NO LENS (INTRAOCULAR LENS)
PROC W SPEC LENS (INTRAOCULAR LENS)
PROCESS W NO LENS (INTRAOCULAR LENS) IMPLANT
PROCESS W SPEC LENS (INTRAOCULAR LENS) IMPLANT
RING MALYGIN (MISCELLANEOUS) IMPLANT
SIGHTPATH CAT PROC W REG LENS (Ophthalmic Related) ×2 IMPLANT
SYR TB 1ML LL NO SAFETY (SYRINGE) ×1 IMPLANT
TAPE CLOTH SOFT 2X10 (GAUZE/BANDAGES/DRESSINGS) ×1 IMPLANT
VISCOELASTIC ADDITIONAL (OPHTHALMIC RELATED) IMPLANT
WATER STERILE IRR 250ML POUR (IV SOLUTION) ×1 IMPLANT

## 2012-03-13 NOTE — Anesthesia Postprocedure Evaluation (Signed)
  Anesthesia Post-op Note  Patient: Alicia [name]  Procedure(s) Performed: Procedure(s) (LRB): CATARACT EXTRACTION PHACO AND INTRAOCULAR LENS PLACEMENT (IOC) (Right)  Patient Location:  Short Stay  Anesthesia Type: MAC  Level of Consciousness: awake  Airway and Oxygen Therapy: Patient Spontanous Breathing  Post-op Pain: none  Post-op Assessment: Post-op Vital signs reviewed, Patient's Cardiovascular Status Stable, Respiratory Function Stable, Patent Airway, No signs of Nausea or vomiting and Pain level controlled  Post-op Vital Signs: Reviewed and stable  Complications: No apparent anesthesia complications

## 2012-03-13 NOTE — Brief Op Note (Signed)
Pre-Op Dx: Cataract OD Post-Op Dx: Cataract OD Surgeon: Elveria Lauderbaugh Anesthesia: Topical with MAC Surgery: Cataract Extraction with Intraocular lens Implant OD Implant: B&L enVista Specimen: None Complications: None 

## 2012-03-13 NOTE — Op Note (Signed)
NAME:  Alicia [name], Alicia [name]                ACCOUNT NO.:  [id number]  MEDICAL RECORD NO.:  [id number]  LOCATION:  [facility]                          FACILITY:  [facility]  PHYSICIAN:  Susanne Greenhouse, MD       DATE OF BIRTH:  01-Jan-1941  DATE OF PROCEDURE:  03/13/2012 DATE OF DISCHARGE:                              OPERATIVE REPORT   PREOPERATIVE DIAGNOSIS:  Nuclear cataract, right eye, diagnosis code 366.16.  POSTOPERATIVE DIAGNOSIS:  Nuclear cataract, right eye, diagnosis code 366.16.  OPERATION PERFORMED:  Phacoemulsification with posterior chamber intraocular lens implantation, right eye.  SURGEON:  Bonne Dolores. Avian Greenawalt, MD  ANESTHESIA:  Topical with monitored anesthesia care and IV sedation.  OPERATIVE SUMMARY:  In the preoperative area, dilating drops were placed into the right eye.  The patient was then brought into the operating room where she was placed under topical anesthesia and IV sedation.  The eye was then prepped and draped.  Beginning with a 75 blade, a paracentesis port was made at the surgeon's 2 o'clock position.  The anterior chamber was then filled with a 1% nonpreserved lidocaine solution with epinephrine.  This was followed by Viscoat to deepen the chamber.  A small fornix-based peritomy was performed superiorly.  Next, a single iris hook was placed through the limbus superiorly.  A 2.4-mm keratome blade was then used to make a clear corneal incision over the iris hook.  A bent cystotome needle and Utrata forceps were used to create a continuous tear capsulotomy.  Hydrodissection was performed using balanced salt solution on a fine cannula.  The lens nucleus was then removed using phacoemulsification in a quadrant cracking technique. The cortical material was then removed with irrigation and aspiration. The capsular bag and anterior chamber were refilled with Provisc.  The wound was widened to approximately 3 mm and a posterior chamber intraocular lens was placed into the capsular  bag without difficulty using an [employer] lens injecting system.  A single 10-0 nylon suture was then used to close the incision as well as stromal hydration. The Provisc was removed from the anterior chamber and capsular bag with irrigation and aspiration.  At this point, the wounds were tested for leak, which were negative.  The anterior chamber remained deep and stable.  The patient tolerated the procedure well.  There were no operative complications, and she awoke from topical anesthesia and IV sedation without problem.  No surgical specimens.  Prosthetic device used is a [employer] , posterior chamber lens, model MX60, power of 26.0, serial number is [id number].          ______________________________ Susanne Greenhouse, MD     KEH/MEDQ  D:  03/13/2012  T:  03/13/2012  Job:  [id number]

## 2012-03-13 NOTE — H&P (Signed)
I have reviewed the H&P, the patient was re-examined, and I have identified no interval changes in medical condition and plan of care since the history and physical of record  

## 2012-03-13 NOTE — Transfer of Care (Signed)
Immediate Anesthesia Transfer of Care Note  Patient: Alicia [name]  Procedure(s) Performed: Procedure(s) (LRB): CATARACT EXTRACTION PHACO AND INTRAOCULAR LENS PLACEMENT (IOC) (Right)  Patient Location: Shortstay  Anesthesia Type: MAC  Level of Consciousness: awake  Airway & Oxygen Therapy: Patient Spontanous Breathing   Post-op Assessment: Report given to PACU RN, Post -op Vital signs reviewed and stable and Patient moving all extremities  Post vital signs: Reviewed and stable  Complications: No apparent anesthesia complications

## 2012-03-13 NOTE — Anesthesia Procedure Notes (Signed)
Procedure Name: MAC Date/Time: 03/13/2012 7:20 AM Performed by: Franco Nones Pre-anesthesia Checklist: Patient identified, Emergency Drugs available, Suction available, Timeout performed and Patient being monitored Patient Re-evaluated:Patient Re-evaluated prior to inductionOxygen Delivery Method: Nasal Cannula

## 2012-03-13 NOTE — Anesthesia Preprocedure Evaluation (Signed)
Anesthesia Evaluation  Patient identified by MRN, date of birth, ID band Patient awake    Reviewed: Allergy & Precautions, H&P , NPO status , Patient's Chart, lab work & pertinent test results  Airway Mallampati: II      Dental  (+) Poor Dentition and Missing   Pulmonary COPD breath sounds clear to auscultation        Cardiovascular hypertension, + Peripheral Vascular Disease Rhythm:Regular Rate:Normal     Neuro/Psych  Headaches, PSYCHIATRIC DISORDERS Anxiety Depression    GI/Hepatic PUD,   Endo/Other    Renal/GU      Musculoskeletal   Abdominal   Peds  Hematology   Anesthesia Other Findings   Reproductive/Obstetrics                           Anesthesia Physical Anesthesia Plan  ASA: III  Anesthesia Plan: MAC   Post-op Pain Management:    Induction: Intravenous  Airway Management Planned: Nasal Cannula  Additional Equipment:   Intra-op Plan:   Post-operative Plan:   Informed Consent: I have reviewed the patients History and Physical, chart, labs and discussed the procedure including the risks, benefits and alternatives for the proposed anesthesia with the patient or authorized representative who has indicated his/her understanding and acceptance.     Plan Discussed with:   Anesthesia Plan Comments:         Anesthesia Quick Evaluation

## 2012-03-14 ENCOUNTER — Encounter (HOSPITAL_COMMUNITY): Payer: Self-pay | Admitting: Ophthalmology

## 2012-03-20 ENCOUNTER — Encounter (HOSPITAL_COMMUNITY): Payer: Self-pay | Admitting: Pharmacy Technician

## 2012-03-22 ENCOUNTER — Encounter (HOSPITAL_COMMUNITY)
Admission: RE | Admit: 2012-03-22 | Discharge: 2012-03-22 | Disposition: A | Discharge status: Home / Self Care | Payer: Medicare PPO | Source: Ambulatory Visit | Attending: Ophthalmology | Admitting: Ophthalmology

## 2012-03-22 ENCOUNTER — Encounter (HOSPITAL_COMMUNITY): Payer: Self-pay

## 2012-03-24 MED ORDER — FENTANYL CITRATE 0.05 MG/ML IJ SOLN: 25.0000 ug | INTRAMUSCULAR | Status: AC | PRN

## 2012-03-24 MED ORDER — CYCLOPENTOLATE-PHENYLEPHRINE 0.2-1 % OP SOLN
OPHTHALMIC | Status: AC
  Filled 2012-03-24: qty 2

## 2012-03-24 MED ORDER — PHENYLEPHRINE HCL 2.5 % OP SOLN
OPHTHALMIC | Status: AC
  Filled 2012-03-24: qty 2

## 2012-03-24 MED ORDER — LIDOCAINE HCL 3.5 % OP GEL
OPHTHALMIC | Status: AC
  Filled 2012-03-24: qty 5

## 2012-03-24 MED ORDER — LIDOCAINE HCL 3.5 % OP GEL: 1.0000 "application " | Freq: Once | OPHTHALMIC | Status: DC

## 2012-03-24 MED ORDER — TETRACAINE HCL 0.5 % OP SOLN
OPHTHALMIC | Status: AC
  Filled 2012-03-24: qty 2

## 2012-03-24 MED ORDER — CYCLOPENTOLATE-PHENYLEPHRINE 0.2-1 % OP SOLN: 1.0000 [drp] | OPHTHALMIC | Status: DC

## 2012-03-24 MED ORDER — NEOMYCIN-POLYMYXIN-DEXAMETH 3.5-10000-0.1 OP OINT
TOPICAL_OINTMENT | OPHTHALMIC | Status: AC
  Filled 2012-03-24: qty 3.5

## 2012-03-24 MED ORDER — TETRACAINE HCL 0.5 % OP SOLN: 1.0000 [drp] | OPHTHALMIC | Status: DC

## 2012-03-24 MED ORDER — LIDOCAINE HCL (PF) 1 % IJ SOLN
INTRAMUSCULAR | Status: AC
  Filled 2012-03-24: qty 2

## 2012-03-24 MED ORDER — PHENYLEPHRINE HCL 2.5 % OP SOLN: 1.0000 [drp] | OPHTHALMIC | Status: DC

## 2012-03-24 MED ORDER — ONDANSETRON HCL 4 MG/2ML IJ SOLN: 4.0000 mg | Freq: Once | INTRAMUSCULAR | Status: AC | PRN

## 2012-03-27 ENCOUNTER — Encounter (HOSPITAL_COMMUNITY)
Admission: RE | Disposition: A | Discharge status: Home / Self Care | Payer: Self-pay | Source: Ambulatory Visit | Attending: Ophthalmology

## 2012-03-27 ENCOUNTER — Ambulatory Visit (HOSPITAL_COMMUNITY)
Admission: RE | Admit: 2012-03-27 | Discharge: 2012-03-27 | Disposition: A | Discharge status: Home / Self Care | Payer: Medicare PPO | Source: Ambulatory Visit | Attending: Ophthalmology | Admitting: Ophthalmology

## 2012-03-27 ENCOUNTER — Encounter (HOSPITAL_COMMUNITY): Payer: Self-pay | Admitting: *Deleted

## 2012-03-27 ENCOUNTER — Ambulatory Visit (HOSPITAL_COMMUNITY): Payer: Medicare PPO | Admitting: Anesthesiology

## 2012-03-27 ENCOUNTER — Encounter (HOSPITAL_COMMUNITY): Payer: Self-pay | Admitting: Anesthesiology

## 2012-03-27 DIAGNOSIS — J4489 Other specified chronic obstructive pulmonary disease: Secondary | ICD-10-CM | POA: Insufficient documentation

## 2012-03-27 DIAGNOSIS — H2510 Age-related nuclear cataract, unspecified eye: Secondary | ICD-10-CM | POA: Insufficient documentation

## 2012-03-27 DIAGNOSIS — I10 Essential (primary) hypertension: Secondary | ICD-10-CM | POA: Insufficient documentation

## 2012-03-27 DIAGNOSIS — J449 Chronic obstructive pulmonary disease, unspecified: Secondary | ICD-10-CM | POA: Insufficient documentation

## 2012-03-27 HISTORY — PX: CATARACT EXTRACTION W/PHACO: SHX586

## 2012-03-27 SURGERY — PHACOEMULSIFICATION, CATARACT, WITH IOL INSERTION
Anesthesia: Monitor Anesthesia Care | Site: Eye | Laterality: Left | Wound class: Clean

## 2012-03-27 MED ORDER — POVIDONE-IODINE 5 % OP SOLN
OPHTHALMIC | Status: DC | PRN
  Administered 2012-03-27: 1 via OPHTHALMIC

## 2012-03-27 MED ORDER — PROVISC 10 MG/ML IO SOLN
INTRAOCULAR | Status: DC | PRN
  Administered 2012-03-27: 8.5 mg via INTRAOCULAR

## 2012-03-27 MED ORDER — EPINEPHRINE HCL 1 MG/ML IJ SOLN
INTRAMUSCULAR | Status: AC
  Filled 2012-03-27: qty 1

## 2012-03-27 MED ORDER — MIDAZOLAM HCL 2 MG/2ML IJ SOLN
1.0000 mg | INTRAMUSCULAR | Status: DC | PRN
  Administered 2012-03-27: 2 mg via INTRAVENOUS

## 2012-03-27 MED ORDER — ONDANSETRON HCL 4 MG/2ML IJ SOLN: 4.0000 mg | Freq: Once | INTRAMUSCULAR | Status: DC | PRN

## 2012-03-27 MED ORDER — BSS IO SOLN
INTRAOCULAR | Status: DC | PRN
  Administered 2012-03-27: 15 mL via INTRAOCULAR

## 2012-03-27 MED ORDER — LACTATED RINGERS IV SOLN
INTRAVENOUS | Status: DC
  Administered 2012-03-27: 10:00:00 via INTRAVENOUS

## 2012-03-27 MED ORDER — LIDOCAINE HCL (PF) 1 % IJ SOLN
INTRAMUSCULAR | Status: DC | PRN
  Administered 2012-03-27: .5 mL

## 2012-03-27 MED ORDER — NEOMYCIN-POLYMYXIN-DEXAMETH 0.1 % OP OINT
TOPICAL_OINTMENT | OPHTHALMIC | Status: DC | PRN
  Administered 2012-03-27: 1 via OPHTHALMIC

## 2012-03-27 MED ORDER — CYCLOPENTOLATE-PHENYLEPHRINE 0.2-1 % OP SOLN
1.0000 [drp] | OPHTHALMIC | Status: AC
Start: 2012-03-27 — End: 2012-03-27
  Administered 2012-03-27 (×3): 1 [drp] via OPHTHALMIC

## 2012-03-27 MED ORDER — PHENYLEPHRINE HCL 2.5 % OP SOLN
1.0000 [drp] | OPHTHALMIC | Status: AC
  Administered 2012-03-27 (×3): 1 [drp] via OPHTHALMIC

## 2012-03-27 MED ORDER — LIDOCAINE HCL 3.5 % OP GEL
1.0000 "application " | Freq: Once | OPHTHALMIC | Status: AC
  Administered 2012-03-27: 1 via OPHTHALMIC

## 2012-03-27 MED ORDER — EPINEPHRINE HCL 1 MG/ML IJ SOLN
INTRAOCULAR | Status: DC | PRN
  Administered 2012-03-27: 10:00:00

## 2012-03-27 MED ORDER — FENTANYL CITRATE 0.05 MG/ML IJ SOLN: 25.0000 ug | INTRAMUSCULAR | Status: DC | PRN

## 2012-03-27 MED ORDER — TETRACAINE HCL 0.5 % OP SOLN
1.0000 [drp] | OPHTHALMIC | Status: AC
  Administered 2012-03-27 (×3): 1 [drp] via OPHTHALMIC

## 2012-03-27 MED ORDER — MIDAZOLAM HCL 2 MG/2ML IJ SOLN
INTRAMUSCULAR | Status: AC
  Filled 2012-03-27: qty 2

## 2012-03-27 SURGICAL SUPPLY — 11 items
CLOTH BEACON ORANGE TIMEOUT ST (SAFETY) ×1 IMPLANT
EYE SHIELD UNIVERSAL CLEAR (GAUZE/BANDAGES/DRESSINGS) ×1 IMPLANT
GLOVE BIOGEL PI IND STRL 7.0 (GLOVE) IMPLANT
GLOVE BIOGEL PI INDICATOR 7.0 (GLOVE) ×1
GLOVE EXAM NITRILE MD LF STRL (GLOVE) ×1 IMPLANT
PAD ARMBOARD 7.5X6 YLW CONV (MISCELLANEOUS) ×1 IMPLANT
SIGHTPATH CAT PROC W REG LENS (Ophthalmic Related) ×2 IMPLANT
SYR TB 1ML LL NO SAFETY (SYRINGE) ×1 IMPLANT
TAPE SURG TRANSPORE 1 IN (GAUZE/BANDAGES/DRESSINGS) IMPLANT
TAPE SURGICAL TRANSPORE 1 IN (GAUZE/BANDAGES/DRESSINGS) ×1
WATER STERILE IRR 250ML POUR (IV SOLUTION) ×1 IMPLANT

## 2012-03-27 NOTE — Anesthesia Preprocedure Evaluation (Signed)
Anesthesia Evaluation  Patient identified by MRN, date of birth, ID band Patient awake    Reviewed: Allergy & Precautions, H&P , NPO status , Patient's Chart, lab work & pertinent test results  Airway Mallampati: II      Dental  (+) Poor Dentition and Missing   Pulmonary COPD breath sounds clear to auscultation        Cardiovascular hypertension, + Peripheral Vascular Disease Rhythm:Regular Rate:Normal     Neuro/Psych  Headaches, PSYCHIATRIC DISORDERS Anxiety Depression    GI/Hepatic PUD,   Endo/Other    Renal/GU      Musculoskeletal   Abdominal   Peds  Hematology   Anesthesia Other Findings   Reproductive/Obstetrics                           Anesthesia Physical Anesthesia Plan  ASA: III  Anesthesia Plan: MAC   Post-op Pain Management:    Induction: Intravenous  Airway Management Planned: Nasal Cannula  Additional Equipment:   Intra-op Plan:   Post-operative Plan:   Informed Consent: I have reviewed the patients History and Physical, chart, labs and discussed the procedure including the risks, benefits and alternatives for the proposed anesthesia with the patient or authorized representative who has indicated his/her understanding and acceptance.     Plan Discussed with:   Anesthesia Plan Comments:         Anesthesia Quick Evaluation  

## 2012-03-27 NOTE — Transfer of Care (Signed)
Immediate Anesthesia Transfer of Care Note  Patient: Alicia [name]  Procedure(s) Performed: Procedure(s) with comments: CATARACT EXTRACTION PHACO AND INTRAOCULAR LENS PLACEMENT (IOC) (Left) - CDE=10.91  Patient Location: Short Stay  Anesthesia Type:MAC  Level of Consciousness: awake, alert , oriented and patient cooperative  Airway & Oxygen Therapy: Patient Spontanous Breathing  Post-op Assessment: Report given to PACU RN, Post -op Vital signs reviewed and stable and Patient moving all extremities  Post vital signs: Reviewed and stable  Complications: No apparent anesthesia complications

## 2012-03-27 NOTE — Anesthesia Postprocedure Evaluation (Signed)
  Anesthesia Post-op Note  Patient: Alicia [name]  Procedure(s) Performed: Procedure(s) with comments: CATARACT EXTRACTION PHACO AND INTRAOCULAR LENS PLACEMENT (IOC) (Left) - CDE=10.91  Patient Location: Short Stay  Anesthesia Type:MAC  Level of Consciousness: awake, alert , oriented and patient cooperative  Airway and Oxygen Therapy: Patient Spontanous Breathing  Post-op Pain: none  Post-op Assessment: Post-op Vital signs reviewed, Patient's Cardiovascular Status Stable, Respiratory Function Stable, Patent Airway and Pain level controlled  Post-op Vital Signs: Reviewed and stable  Complications: No apparent anesthesia complications

## 2012-03-27 NOTE — Brief Op Note (Signed)
Pre-Op Dx: Cataract OS Post-Op Dx: Cataract OS Surgeon: Hunt, Kerry Anesthesia: Topical with MAC Surgery: Cataract Extraction with Intraocular lens Implant OS Implant: B&L enVista Specimen: None Complications: None 

## 2012-03-27 NOTE — H&P (Signed)
I have reviewed the H&P, the patient was re-examined, and I have identified no interval changes in medical condition and plan of care since the history and physical of record  

## 2012-03-28 NOTE — Op Note (Signed)
NAME:  [name], Alicia [name]                ACCOUNT NO.:  [id number]  MEDICAL RECORD NO.:  [id number]  LOCATION:  [facility]                          FACILITY:  [facility]  PHYSICIAN:  Susanne Greenhouse, MD       DATE OF BIRTH:  Oct 03, 1940  DATE OF PROCEDURE:  03/27/2012 DATE OF DISCHARGE:  03/27/2012                              OPERATIVE REPORT   PREOPERATIVE DIAGNOSIS:  Nuclear cataract, left eye, diagnosis code 366.16.  POSTOPERATIVE DIAGNOSIS:  Nuclear cataract, left eye, diagnosis code 366.16.  OPERATION PERFORMED:  Phacoemulsification, posterior chamber intraocular lens implantation, left eye.  SURGEON:  Bonne Dolores. Hunt, MD  ANESTHESIA:  Topical with monitored anesthesia care and IV sedation.  OPERATIVE SUMMARY:  In the preoperative area, dilating drops were placed into the left eye.  The patient was then brought into the operating room where she was placed under topical anesthesia and IV sedation.  The eye was then prepped and draped.  Beginning with a 75 blade, a paracentesis port was made at the surgeon's 2 o'clock position.  The anterior chamber was then filled with a 1% nonpreserved lidocaine solution with epinephrine.  This was followed by Viscoat to deepen the chamber.  A small fornix-based peritomy was performed superiorly.  Next, a single iris hook was placed through the limbus superiorly.  A 2.4-mm keratome blade was then used to make a clear corneal incision over the iris hook. A bent cystotome needle and Utrata forceps were used to create a continuous tear capsulotomy.  Hydrodissection was performed using balanced salt solution on a fine cannula.  The lens nucleus was then removed using phacoemulsification in a quadrant cracking technique.  The cortical material was then removed with irrigation and aspiration.  The capsular bag and anterior chamber were refilled with Provisc.  The wound was widened to approximately 3 mm and a posterior chamber intraocular lens was placed into the  capsular bag without difficulty using an [employer] lens injecting system.  A single 10-0 nylon suture was then used to close the incision as well as stromal hydration.  The Provisc was removed from the anterior chamber and capsular bag with irrigation and aspiration.  At this point, the wounds were tested for leak, which were negative.  The anterior chamber remained deep and stable.  The patient tolerated the procedure well.  There were no operative complications, and she awoke from topical anesthesia and IV sedation without problem.  No surgical specimens.  Prosthetic device used is Bausch and Lomb, posterior chamber lens, model EnVista, model number MX60, power of 27.0, serial number is [id number].          ______________________________ Susanne Greenhouse, MD     KEH/MEDQ  D:  03/27/2012  T:  03/28/2012  Job:  [id number]

## 2012-03-29 ENCOUNTER — Encounter (HOSPITAL_COMMUNITY): Payer: Self-pay | Admitting: Ophthalmology

## 2012-08-15 ENCOUNTER — Ambulatory Visit: Payer: PRIVATE HEALTH INSURANCE | Admitting: Neurosurgery

## 2012-09-22 ENCOUNTER — Encounter: Payer: Self-pay | Admitting: Surgery

## 2012-09-25 ENCOUNTER — Encounter (INDEPENDENT_AMBULATORY_CARE_PROVIDER_SITE_OTHER): Payer: Medicare PPO | Admitting: Vascular Surgery

## 2012-09-25 ENCOUNTER — Ambulatory Visit (INDEPENDENT_AMBULATORY_CARE_PROVIDER_SITE_OTHER): Payer: Medicare PPO | Admitting: Family

## 2012-09-25 ENCOUNTER — Encounter: Payer: Self-pay | Admitting: Family

## 2012-09-25 VITALS — BP 119/78 | HR 111 | Resp 16 | Ht 62.0 in | Wt 109.0 lb

## 2012-09-25 DIAGNOSIS — I70219 Atherosclerosis of native arteries of extremities with intermittent claudication, unspecified extremity: Secondary | ICD-10-CM

## 2012-09-25 DIAGNOSIS — I739 Peripheral vascular disease, unspecified: Secondary | ICD-10-CM

## 2012-09-25 NOTE — Patient Instructions (Addendum)
Peripheral Vascular Disease Peripheral Vascular Disease (PVD), also called Peripheral Arterial Disease (PAD), is a circulation problem caused by cholesterol (atherosclerotic plaque) deposits in the arteries. PVD commonly occurs in the lower extremities (legs) but it can occur in other areas of the body, such as your arms. The cholesterol buildup in the arteries reduces blood flow which can cause pain and other serious problems. The presence of PVD can place a person at risk for Coronary Artery Disease (CAD).  CAUSES  Causes of PVD can be many. It is usually associated with more than one risk factor such as:   High Cholesterol.  Smoking.  Diabetes.  Lack of exercise or inactivity.  High blood pressure (hypertension).  Obesity.  Family history. SYMPTOMS   When the lower extremities are affected, patients with PVD may experience:  Leg pain with exertion or physical activity. This is called INTERMITTENT CLAUDICATION. This may present as cramping or numbness with physical activity. The location of the pain is associated with the level of blockage. For example, blockage at the abdominal level (distal abdominal aorta) may result in buttock or hip pain. Lower leg arterial blockage may result in calf pain.  As PVD becomes more severe, pain can develop with less physical activity.  In people with severe PVD, leg pain may occur at rest.  Other PVD signs and symptoms:  Leg numbness or weakness.  Coldness in the affected leg or foot, especially when compared to the other leg.  A change in leg color.  Patients with significant PVD are more prone to ulcers or sores on toes, feet or legs. These may take longer to heal or may reoccur. The ulcers or sores can become infected.  If signs and symptoms of PVD are ignored, gangrene may occur. This can result in the loss of toes or loss of an entire limb.  Not all leg pain is related to PVD. Other medical conditions can cause leg pain such  as:  Blood clots (embolism) or Deep Vein Thrombosis.  Inflammation of the blood vessels (vasculitis).  Spinal stenosis. DIAGNOSIS  Diagnosis of PVD can involve several different types of tests. These can include:  Pulse Volume Recording Method (PVR). This test is simple, painless and does not involve the use of X-rays. PVR involves measuring and comparing the blood pressure in the arms and legs. An ABI (Ankle-Brachial Index) is calculated. The normal ratio of blood pressures is 1. As this number becomes smaller, it indicates more severe disease.  < 0.95  indicates significant narrowing in one or more leg vessels.  <0.8 there will usually be pain in the foot, leg or buttock with exercise.  <0.4 will usually have pain in the legs at rest.  <0.25  usually indicates limb threatening PVD.  Doppler detection of pulses in the legs. This test is painless and checks to see if you have a pulses in your legs/feet.  A dye or contrast material (a substance that highlights the blood vessels so they show up on x-ray) may be given to help your caregiver better see the arteries for the following tests. The dye is eliminated from your body by the kidney's. Your caregiver may order blood work to check your kidney function and other laboratory values before the following tests are performed:  Magnetic Resonance Angiography (MRA). An MRA is a picture study of the blood vessels and arteries. The MRA machine uses a large magnet to produce images of the blood vessels.  Computed Tomography Angiography (CTA). A CTA is a   specialized x-ray that looks at how the blood flows in your blood vessels. An IV may be inserted into your arm so contrast dye can be injected.  Angiogram. Is a procedure that uses x-rays to look at your blood vessels. This procedure is minimally invasive, meaning a small incision (cut) is made in your groin. A small tube (catheter) is then inserted into the artery of your groin. The catheter is  guided to the blood vessel or artery your caregiver wants to examine. Contrast dye is injected into the catheter. X-rays are then taken of the blood vessel or artery. After the images are obtained, the catheter is taken out. TREATMENT  Treatment of PVD involves many interventions which may include:  Lifestyle changes:  Quitting smoking.  Exercise.  Following a low fat, low cholesterol diet.  Control of diabetes.  Foot care is very important to the PVD patient. Good foot care can help prevent infection.  Medication:  Cholesterol-lowering medicine.  Blood pressure medicine.  Anti-platelet drugs.  Certain medicines may reduce symptoms of Intermittent Claudication.  Interventional/Surgical options:  Angioplasty. An Angioplasty is a procedure that inflates a balloon in the blocked artery. This opens the blocked artery to improve blood flow.  Stent Implant. A wire mesh tube (stent) is placed in the artery. The stent expands and stays in place, allowing the artery to remain open.  Peripheral Bypass Surgery. This is a surgical procedure that reroutes the blood around a blocked artery to help improve blood flow. This type of procedure may be performed if Angioplasty or stent implants are not an option. SEEK IMMEDIATE MEDICAL CARE IF:   You develop pain or numbness in your arms or legs.  Your arm or leg turns cold, becomes blue in color.  You develop redness, warmth, swelling and pain in your arms or legs. MAKE SURE YOU:   Understand these instructions.  Will watch your condition.  Will get help right away if you are not doing well or get worse. Document Released: 02/12/2004 Document Revised: 03/29/2011 Document Reviewed: 01/09/2008 Exit[employer] Patient Information 2014 [employer].   Smoking Cessation Quitting smoking is important to your health and has many advantages. However, it is not always easy to quit since nicotine is a very addictive drug. Often times, people try 3  times or more before being able to quit. This document explains the best ways for you to prepare to quit smoking. Quitting takes hard work and a lot of effort, but you can do it. ADVANTAGES OF QUITTING SMOKING  You will live longer, feel better, and live better.  Your body will feel the impact of quitting smoking almost immediately.  Within 20 minutes, blood pressure decreases. Your pulse returns to its normal level.  After 8 hours, carbon monoxide levels in the blood return to normal. Your oxygen level increases.  After 24 hours, the chance of having a heart attack starts to decrease. Your breath, hair, and body stop smelling like smoke.  After 48 hours, damaged nerve endings begin to recover. Your sense of taste and smell improve.  After 72 hours, the body is virtually free of nicotine. Your bronchial tubes relax and breathing becomes easier.  After 2 to 12 weeks, lungs can hold more air. Exercise becomes easier and circulation improves.  The risk of having a heart attack, stroke, cancer, or lung disease is greatly reduced.  After 1 year, the risk of coronary heart disease is cut in half.  After 5 years, the risk of stroke falls to   the same as a nonsmoker.  After 10 years, the risk of lung cancer is cut in half and the risk of other cancers decreases significantly.  After 15 years, the risk of coronary heart disease drops, usually to the level of a nonsmoker.  If you are pregnant, quitting smoking will improve your chances of having a healthy baby.  The people you live with, especially any children, will be healthier.  You will have extra money to spend on things other than cigarettes. QUESTIONS TO THINK ABOUT BEFORE ATTEMPTING TO QUIT You may want to talk about your answers with your caregiver.  Why do you want to quit?  If you tried to quit in the past, what helped and what did not?  What will be the most difficult situations for you after you quit? How will you plan to  handle them?  Who can help you through the tough times? Your family? Friends? A caregiver?  What pleasures do you get from smoking? What ways can you still get pleasure if you quit? Here are some questions to ask your caregiver:  How can you help me to be successful at quitting?  What medicine do you think would be best for me and how should I take it?  What should I do if I need more help?  What is smoking withdrawal like? How can I get information on withdrawal? GET READY  Set a quit date.  Change your environment by getting rid of all cigarettes, ashtrays, matches, and lighters in your home, car, or work. Do not let people smoke in your home.  Review your past attempts to quit. Think about what worked and what did not. GET SUPPORT AND ENCOURAGEMENT You have a better chance of being successful if you have help. You can get support in many ways.  Tell your family, friends, and co-workers that you are going to quit and need their support. Ask them not to smoke around you.  Get individual, group, or telephone counseling and support. Programs are available at local hospitals and health centers. Call your local health department for information about programs in your area.  Spiritual beliefs and practices may help some smokers quit.  Download a "quit meter" on your computer to keep track of quit statistics, such as how long you have gone without smoking, cigarettes not smoked, and money saved.  Get a self-help book about quitting smoking and staying off of tobacco. LEARN NEW SKILLS AND BEHAVIORS  Distract yourself from urges to smoke. Talk to someone, go for a walk, or occupy your time with a task.  Change your normal routine. Take a different route to work. Drink tea instead of coffee. Eat breakfast in a different place.  Reduce your stress. Take a hot bath, exercise, or read a book.  Plan something enjoyable to do every day. Reward yourself for not smoking.  Explore  interactive web-based programs that specialize in helping you quit. GET MEDICINE AND USE IT CORRECTLY Medicines can help you stop smoking and decrease the urge to smoke. Combining medicine with the above behavioral methods and support can greatly increase your chances of successfully quitting smoking.  Nicotine replacement therapy helps deliver nicotine to your body without the negative effects and risks of smoking. Nicotine replacement therapy includes nicotine gum, lozenges, inhalers, nasal sprays, and skin patches. Some may be available over-the-counter and others require a prescription.  Antidepressant medicine helps people abstain from smoking, but how this works is unknown. This medicine is available by prescription.    Nicotinic receptor partial agonist medicine simulates the effect of nicotine in your brain. This medicine is available by prescription. Ask your caregiver for advice about which medicines to use and how to use them based on your health history. Your caregiver will tell you what side effects to look out for if you choose to be on a medicine or therapy. Carefully read the information on the package. Do not use any other product containing nicotine while using a nicotine replacement product.  RELAPSE OR DIFFICULT SITUATIONS Most relapses occur within the first 3 months after quitting. Do not be discouraged if you start smoking again. Remember, most people try several times before finally quitting. You may have symptoms of withdrawal because your body is used to nicotine. You may crave cigarettes, be irritable, feel very hungry, cough often, get headaches, or have difficulty concentrating. The withdrawal symptoms are only temporary. They are strongest when you first quit, but they will go away within 10 14 days. To reduce the chances of relapse, try to:  Avoid drinking alcohol. Drinking lowers your chances of successfully quitting.  Reduce the amount of caffeine you consume. Once you  quit smoking, the amount of caffeine in your body increases and can give you symptoms, such as a rapid heartbeat, sweating, and anxiety.  Avoid smokers because they can make you want to smoke.  Do not let weight gain distract you. Many smokers will gain weight when they quit, usually less than 10 pounds. Eat a healthy diet and stay active. You can always lose the weight gained after you quit.  Find ways to improve your mood other than smoking. FOR MORE INFORMATION  www.smokefree.gov  Document Released: 12/29/2000 Document Revised: 07/06/2011 Document Reviewed: 04/15/2011 [employer] Patient Information 2014 [employer].  

## 2012-09-25 NOTE — Progress Notes (Signed)
[facility] [place] HISTORY AND PHYSICAL -PAD   History of Present Illness Alicia [name] is a 72 y.o. female who is status post right femoral to above the knee popliteal artery bypass graft on 01/24/2008 by Dr. Myra Gianotti. She returns today for ABI surveillance of both LE's.  Can walk about 10 minutes before right thigh and calf muscles start to ache, relieved by rest sometimes, but other times not relieved by rest until about 30 minutes later. States she fractured her ankle in 2011 walking on the ice. States she will see her PCP tomorrow. Denies symptoms of TIA or stroke.   Patient denies non healing ulcers on lower extremities. States her feet stay cold.  reports New Medical or Surgical History:  As dizziness for a few months, but states she was not taking her blood pressure medications as prescribed and her pressure was high, no new surgeries. The heat makes her dizzy and she falls.   Pt Diabetic: No Pt smoker: smoker  (1/2 ppd since age 89 yrs)  Pt meds include: Statin :Yes Betablocker: No ASA: No, ASA caused stomach burning Other anticoagulants/antiplatelets: none  Past Medical History  Diagnosis Date  . Allergic rhinitis   . Anxiety   . Depression   . Hyperlipidemia   . Hypertension   . Low back pain   . Peptic ulcer disease   . Irritable bowel syndrome with constipation   . Cataract   . Knee fracture   . Migraines   . Osteoarthritis   . DeQuervain's disease (tenosynovitis)   . PVD (peripheral vascular disease)   . Right rotator cuff tear   . COPD (chronic obstructive pulmonary disease)     Social History History  Substance Use Topics  . Smoking status: Current Every Day Smoker -- 1.00 packs/day for 52 years    Types: Cigarettes  . Smokeless tobacco: Never Used     Comment: pt states she smokes about 3-4 cigarettes a day  . Alcohol Use: No     Comment: she quit 1 year ago    Family History Family History  Problem Relation Age of  Onset  . Hypertension Mother   . Heart attack Mother   . Colon cancer Neg Hx     Past Surgical History  Procedure Laterality Date  . Breast tumor    . Back tumor    . Right inguinal herniorraphy    . Right femur fracture    . Right femoral to above the knee popliteal bypass    . Right ankle otif    . Pr vein bypass graft,aorto-fem-pop    . Colonoscopy  12/08/2011    Procedure: COLONOSCOPY;  Surgeon: Corbin Ade, MD;  Location: [facility];  Service: Endoscopy;  Laterality: N/A;  11:15 AM  . Cataract extraction w/phaco Right 03/13/2012    Procedure: CATARACT EXTRACTION PHACO AND INTRAOCULAR LENS PLACEMENT (IOC);  Surgeon: Gemma Payor, MD;  Location: [facility];  Service: Ophthalmology;  Laterality: Right;  CDE:15.51  . Cataract extraction w/phaco Left 03/27/2012    Procedure: CATARACT EXTRACTION PHACO AND INTRAOCULAR LENS PLACEMENT (IOC);  Surgeon: Gemma Payor, MD;  Location: [facility];  Service: Ophthalmology;  Laterality: Left;  CDE=10.91    Allergies  Allergen Reactions  . Aspirin     REACTION: stomach upset    Current Outpatient Prescriptions  Medication Sig Dispense Refill  . amLODipine (NORVASC) 10 MG tablet Take 10 mg by mouth daily.      . calcium-vitamin D (OSCAL  WITH D) 500-200 MG-UNIT per tablet Take 1 tablet by mouth daily.        Jennette Banker Sodium (STOOL SOFTENER/LAXATIVE PO) Take 100 each by mouth 1 day or 1 dose.        . cilostazol (PLETAL) 100 MG tablet Take 100 mg by mouth 2 (two) times daily.       . fesoterodine (TOVIAZ) 4 MG TB24 Take 4 mg by mouth daily.      Marland Kitchen gabapentin (NEURONTIN) 100 MG capsule Take 100 mg by mouth at bedtime.       Marland Kitchen losartan (COZAAR) 50 MG tablet Take 50 mg by mouth daily.       . mirtazapine (REMERON) 15 MG tablet Take 15 mg by mouth at bedtime.        . Multiple Vitamin (MULTIVITAMIN) tablet Take 1 tablet by mouth daily.      . polyvinyl alcohol-povidone (HYPOTEARS) 1.4-0.6 % ophthalmic solution Place 1-2 drops into both  eyes as needed. Dry eyes.      . risedronate (ACTONEL) 35 MG tablet Take 35 mg by mouth every 7 (seven) days. with water on empty stomach, nothing by mouth or lie down for next 30 minutes.      . simvastatin (ZOCOR) 20 MG tablet Take 20 mg by mouth at bedtime.        . traMADol-acetaminophen (ULTRACET) 37.5-325 MG per tablet Take 1 tablet by mouth 2 (two) times daily.       Marland Kitchen zolpidem (AMBIEN) 5 MG tablet Take 1 tablet by mouth at bedtime.      Marland Kitchen Besifloxacin HCl (BESIVANCE) 0.6 % SUSP Apply 1 drop to eye 3 (three) times daily.      . Difluprednate (DUREZOL) 0.05 % EMUL Apply 1 drop to eye 3 (three) times daily.      . Nepafenac (ILEVRO) 0.3 % SUSP Apply 1 drop to eye daily.       No current facility-administered medications for this visit.   Facility-Administered Medications Ordered in Other Visits  Medication Dose Route Frequency Provider Last Rate Last Dose  . fentaNYL (SUBLIMAZE) injection 25-50 mcg  25-50 mcg Intravenous Q5 min PRN Laurene Footman, MD        ROS: [x]  Positive   [ ]  Denies  General:[ ]  Weight loss,  [ ]  Weight gain, [ ]  Fever, [ ]  chills Neurologic: [ ]  Dizziness, [ ]  Blackouts, [ ]  Seizure [ ]  Stroke, [ ]  "Mini stroke", [ ]  Slurred speech, [ ]  Temporary blindness;  [ ] weakness, [ ]  Hoarseness Cardiac: [ ]  Chest pain/pressure, [ ]  Shortness of breath at rest Arly.Keller ] Shortness of breath with exertion,  [ ]   Atrial fibrillation or irregular heartbeat Vascular:[X ] Pain in legs with walking, [ ]  Pain in legs at rest ,[ ]  Pain in legs at night,  [ ]   Non-healing ulcer, [ ]  Blood clot in vein/DVT,   Pulmonary: [ ]  Home oxygen, [ ]   Productive cough, [ ]  Coughing up blood,  [ ]  Asthma,  [ ]  Wheezing Musculoskeletal:  Arly.Keller ] Arthritis, [ ]  Low back pain,  [ ]  Joint pain. Positive for osteoporosis. Hematologic:[ ]  Easy Bruising, [ ]  Anemia; [ ]  Hepatitis Gastrointestinal: [ ]  Blood in stool,  [ ]  Gastroesophageal Reflux, [ ]  Trouble swallowing Urinary: [ ]  chronic Kidney disease,  [ ]  on HD, [ ]  Burning with urination, Arly.Keller ] Frequent urination, [ ]  Difficulty urinating;  Skin: [ ]  Rashes, [ ]  Wounds   Psychiatric: lost  her "soul mate" 8 years ago, eats less since then, states she rarely feels depressed. Her dog is good company.   Physical Examination  Filed Vitals:   09/25/12 1406  BP: 119/78  Pulse: 111  Resp: 16   Filed Weights   09/25/12 1406  Weight: 109 lb (49.442 kg)   Body mass index is 19.93 kg/(m^2).   General: A&O x 3, WDWN,  Gait: normal Eyes: PERRLA, Pulmonary: CTAB, without wheezes , rales or rhonchi, but left posterior lung fields have diminished air movement. Cardiac: Sinus tachycardia at 112 , without murmur          Carotid Bruits Left Right   Negative Negative                             VASCULAR EXAM: Extremities without ischemic changes  without Gangrene; without open wounds.                                                                                                          LE Pulses LEFT RIGHT       FEMORAL   palpable   palpable        POPLITEAL  not palpable   not palpable       POSTERIOR TIBIAL  not palpable, monophasic by Doppler   not palpable, no Doppler signal        DORSALIS PEDIS      ANTERIOR TIBIAL  palpable  not palpable, monophasic by Doppler        PERONEAL monophasic by Doppler, not Palpable   monophasic by Doppler, not Palpable    Abdomen: soft, NT, no masses Skin: no rashes, ulcers noted, old well healed scarring medial aspect of right ankle. Musculoskeletal: no muscle wasting or atrophy  Neurologic: A&O X 3; Appropriate Affect ; SENSATION: normal; MOTOR FUNCTION:  moving all extremities equally, M/S 5/5 in upper extremities, 4/5 in lower extremities. Speech is fluent/normal    Non-Invasive Vascular Imaging: DATE: 09/25/2012 ABI: RIGHT 0.73, Waveforms: monophasic;  LEFT 0.83, Waveforms: biphasic PT, monophasic DP.  Previous Non-invasive vascular imaging (08/16/11): ABI performed was 0.9 for the  peroneal 0.79 in the posterior tibial on the right. On the left ABI is 1.2 with triphasic waveforms; biphasic waveforms on the right.   ASSESSMENT: VELTA ROCKHOLT is a 72 y.o. female who presents with claudication symptoms in both lower extremities. Right ABI's indicate moderate arterial occlusive disease. Left ABI's indicate mild arterial occlusive disease. She has had a decrease in both ABI's and toe brachial indices compared to 08/16/11 ABI's, indicating a slight progression of PAD. She was counseled about the effect that smoking has on her PAD, CAD, and lung disease.  PLAN:  I discussed in depth with the patient the nature of atherosclerosis, and emphasized the importance of maximal medical management including strict control of blood pressure, blood glucose, and lipid levels, obtaining regular exercise, and cessation of smoking.  The patient is aware that without maximal medical management the underlying atherosclerotic disease process will  progress, limiting the benefit of any interventions. After discussing with Dr. Myra Gianotti, and based on the patient's vascular studies and examination, pt will return to clinic in 1 year for ABI's and right lower extremity Duplex.  Charisse March, RN, MSN, FNP-C Office Phone: [phone]  Clinic MD: Myra Gianotti  09/25/2012 2:20 PM

## 2012-09-26 ENCOUNTER — Other Ambulatory Visit: Payer: Self-pay | Admitting: *Deleted

## 2012-09-26 DIAGNOSIS — I739 Peripheral vascular disease, unspecified: Secondary | ICD-10-CM

## 2012-09-26 DIAGNOSIS — Z48812 Encounter for surgical aftercare following surgery on the circulatory system: Secondary | ICD-10-CM

## 2013-03-27 ENCOUNTER — Other Ambulatory Visit (HOSPITAL_COMMUNITY): Payer: Self-pay | Admitting: Internal Medicine

## 2013-03-27 DIAGNOSIS — M810 Age-related osteoporosis without current pathological fracture: Secondary | ICD-10-CM

## 2013-03-29 ENCOUNTER — Ambulatory Visit (HOSPITAL_COMMUNITY)
Admission: RE | Admit: 2013-03-29 | Discharge: 2013-03-29 | Disposition: A | Discharge status: Home / Self Care | Payer: Medicare PPO | Source: Ambulatory Visit | Attending: Internal Medicine | Admitting: Internal Medicine

## 2013-03-29 DIAGNOSIS — M810 Age-related osteoporosis without current pathological fracture: Secondary | ICD-10-CM

## 2013-03-29 DIAGNOSIS — Z13820 Encounter for screening for osteoporosis: Secondary | ICD-10-CM | POA: Insufficient documentation

## 2013-07-18 HISTORY — PX: CYST EXCISION: SHX5701

## 2013-08-27 ENCOUNTER — Ambulatory Visit (INDEPENDENT_AMBULATORY_CARE_PROVIDER_SITE_OTHER): Payer: Medicare HMO | Admitting: Orthopedic Surgery

## 2013-08-27 ENCOUNTER — Ambulatory Visit (INDEPENDENT_AMBULATORY_CARE_PROVIDER_SITE_OTHER): Payer: Medicare HMO

## 2013-08-27 VITALS — BP 142/82 | Ht 62.0 in | Wt 109.0 lb

## 2013-08-27 DIAGNOSIS — M25539 Pain in unspecified wrist: Secondary | ICD-10-CM

## 2013-08-27 DIAGNOSIS — M719 Bursopathy, unspecified: Secondary | ICD-10-CM

## 2013-08-27 DIAGNOSIS — M67919 Unspecified disorder of synovium and tendon, unspecified shoulder: Secondary | ICD-10-CM

## 2013-08-27 DIAGNOSIS — M25532 Pain in left wrist: Secondary | ICD-10-CM

## 2013-08-27 DIAGNOSIS — M75101 Unspecified rotator cuff tear or rupture of right shoulder, not specified as traumatic: Secondary | ICD-10-CM

## 2013-08-27 NOTE — Patient Instructions (Signed)
Joint Injection  Care After  Refer to this sheet in the next few days. These instructions provide you with information on caring for yourself after you have had a joint injection. Your caregiver also may give you more specific instructions. Your treatment has been planned according to current medical practices, but problems sometimes occur. Call your caregiver if you have any problems or questions after your procedure.  After any type of joint injection, it is not uncommon to experience:  · Soreness, swelling, or bruising around the injection site.  · Mild numbness, tingling, or weakness around the injection site caused by the numbing medicine used before or with the injection.  It also is possible to experience the following effects associated with the specific agent after injection:  · Iodine-based contrast agents:  ¨ Allergic reaction (itching, hives, widespread redness, and swelling beyond the injection site).  · Corticosteroids (These effects are rare.):  ¨ Allergic reaction.  ¨ Increased blood sugar levels (If you have diabetes and you notice that your blood sugar levels have increased, notify your caregiver).  ¨ Increased blood pressure levels.  ¨ Mood swings.  · Hyaluronic acid in the use of viscosupplementation.  ¨ Temporary heat or redness.  ¨ Temporary rash and itching.  ¨ Increased fluid accumulation in the injected joint.  These effects all should resolve within a day after your procedure.   HOME CARE INSTRUCTIONS  · Limit yourself to light activity the day of your procedure. Avoid lifting heavy objects, bending, stooping, or twisting.  · Take prescription or over-the-counter pain medication as directed by your caregiver.  · You may apply ice to your injection site to reduce pain and swelling the day of your procedure. Ice may be applied 03-04 times:  ¨ Put ice in a plastic bag.  ¨ Place a towel between your skin and the bag.  ¨ Leave the ice on for no longer than 15-20 minutes each time.  SEEK  IMMEDIATE MEDICAL CARE IF:   · Pain and swelling get worse rather than better or extend beyond the injection site.  · Numbness does not go away.  · Blood or fluid continues to leak from the injection site.  · You have chest pain.  · You have swelling of your face or tongue.  · You have trouble breathing or you become dizzy.  · You develop a fever, chills, or severe tenderness at the injection site that last longer than 1 day.  MAKE SURE YOU:  · Understand these instructions.  · Watch your condition.  · Get help right away if you are not doing well or if you get worse.  Document Released: 09/17/2010 Document Revised: 03/29/2011 Document Reviewed: 09/17/2010  [employer]® Patient Information ©2015 [employer]. This information is not intended to replace advice given to you by your health care provider. Make sure you discuss any questions you have with your health care provider.

## 2013-08-27 NOTE — Progress Notes (Signed)
Subjective:     Patient ID: Alicia [name], female   DOB: 1940/07/05, 73 y.o.   MRN: [id number]  HPI Chief Complaint  Patient presents with  . Hand Pain    bilateral hand pain, hurt moving freezer in June   73 year old female who was moving a freezer and injured her hands and right and left shoulder. She's had a history of difficulties with her left shoulder with MRI showing complete loss of rotator cuff function with severe tear she also had pain and difficulties with the right shoulder although there is no rotator cuff tear on her MRI showed a cyst there which was found to be benign.     Review of Systems     Objective:   Physical Exam BP 142/82  Ht 5\' 2"  (1.575 m)  Wt 109 lb (49.442 kg)  BMI 19.93 kg/m2 General appearance shows a very small frame she is oriented x3 pleasant mood has a decent gait  Both shoulders are painful to move right worse than left she has severe pain with range of motion which is limited to the right shoulder although it is stable she has weakness in the rotator cuff or skin is normal pulses and temperature intact in the right upper extremity she has negative lymph nodes  Shows bilateral hand swelling she can only flex her MPs and PIPs approximately 50% there is no instability there is tenderness in the wrist the skin is normal there are no signs of carpal tunnel is no lymphadenopathy and normal pulses bilaterally.  Left shoulder she is mild pain with range of motion which is also limited by pain her shoulder is stable strength shows weakness in the supraspinatus and external rotators were normal deltoid function      Assessment:     X-rays of the wrist are normal  Assessment tendinitis of the hands and forearm Assessment #2 rotator cuff syndrome right shoulder #3 rotator cuff tear left shoulder documented by MRI 2010     Plan:     Recommend subacromial injection in the right shoulder 5 mg prednisone Dosepak to address the tendinitis IM shot to  address overall joint pain

## 2013-10-02 ENCOUNTER — Ambulatory Visit: Payer: Medicare PPO | Admitting: Family

## 2013-10-02 ENCOUNTER — Other Ambulatory Visit (HOSPITAL_COMMUNITY): Payer: Medicare PPO

## 2013-10-02 ENCOUNTER — Encounter (HOSPITAL_COMMUNITY): Payer: Medicare PPO

## 2013-11-30 ENCOUNTER — Encounter: Payer: Self-pay | Admitting: Family

## 2013-12-03 ENCOUNTER — Encounter: Payer: Self-pay | Admitting: Family

## 2013-12-03 ENCOUNTER — Ambulatory Visit (INDEPENDENT_AMBULATORY_CARE_PROVIDER_SITE_OTHER): Payer: Medicare HMO | Admitting: Family

## 2013-12-03 ENCOUNTER — Ambulatory Visit (INDEPENDENT_AMBULATORY_CARE_PROVIDER_SITE_OTHER)
Admission: RE | Admit: 2013-12-03 | Discharge: 2013-12-03 | Disposition: A | Discharge status: Home / Self Care | Payer: Medicare HMO | Source: Ambulatory Visit | Attending: Surgery | Admitting: Surgery

## 2013-12-03 ENCOUNTER — Ambulatory Visit (HOSPITAL_COMMUNITY)
Admission: RE | Admit: 2013-12-03 | Discharge: 2013-12-03 | Disposition: A | Discharge status: Home / Self Care | Payer: Medicare HMO | Source: Ambulatory Visit | Attending: Surgery | Admitting: Surgery

## 2013-12-03 VITALS — BP 119/86 | HR 108 | Resp 16 | Ht 62.0 in | Wt 108.0 lb

## 2013-12-03 DIAGNOSIS — I739 Peripheral vascular disease, unspecified: Secondary | ICD-10-CM | POA: Insufficient documentation

## 2013-12-03 DIAGNOSIS — Z48812 Encounter for surgical aftercare following surgery on the circulatory system: Secondary | ICD-10-CM

## 2013-12-03 DIAGNOSIS — R6889 Other general symptoms and signs: Secondary | ICD-10-CM

## 2013-12-03 NOTE — Patient Instructions (Signed)
Peripheral Vascular Disease Peripheral Vascular Disease (PVD), also called Peripheral Arterial Disease (PAD), is a circulation problem caused by cholesterol (atherosclerotic plaque) deposits in the arteries. PVD commonly occurs in the lower extremities (legs) but it can occur in other areas of the body, such as your arms. The cholesterol buildup in the arteries reduces blood flow which can cause pain and other serious problems. The presence of PVD can place a person at risk for Coronary Artery Disease (CAD).  CAUSES  Causes of PVD can be many. It is usually associated with more than one risk factor such as:   High Cholesterol.  Smoking.  Diabetes.  Lack of exercise or inactivity.  High blood pressure (hypertension).  Obesity.  Family history. SYMPTOMS   When the lower extremities are affected, patients with PVD may experience:  Leg pain with exertion or physical activity. This is called INTERMITTENT CLAUDICATION. This may present as cramping or numbness with physical activity. The location of the pain is associated with the level of blockage. For example, blockage at the abdominal level (distal abdominal aorta) may result in buttock or hip pain. Lower leg arterial blockage may result in calf pain.  As PVD becomes more severe, pain can develop with less physical activity.  In people with severe PVD, leg pain may occur at rest.  Other PVD signs and symptoms:  Leg numbness or weakness.  Coldness in the affected leg or foot, especially when compared to the other leg.  A change in leg color.  Patients with significant PVD are more prone to ulcers or sores on toes, feet or legs. These may take longer to heal or may reoccur. The ulcers or sores can become infected.  If signs and symptoms of PVD are ignored, gangrene may occur. This can result in the loss of toes or loss of an entire limb.  Not all leg pain is related to PVD. Other medical conditions can cause leg pain such  as:  Blood clots (embolism) or Deep Vein Thrombosis.  Inflammation of the blood vessels (vasculitis).  Spinal stenosis. DIAGNOSIS  Diagnosis of PVD can involve several different types of tests. These can include:  Pulse Volume Recording Method (PVR). This test is simple, painless and does not involve the use of X-rays. PVR involves measuring and comparing the blood pressure in the arms and legs. An ABI (Ankle-Brachial Index) is calculated. The normal ratio of blood pressures is 1. As this number becomes smaller, it indicates more severe disease.  < 0.95 - indicates significant narrowing in one or more leg vessels.  <0.8 - there will usually be pain in the foot, leg or buttock with exercise.  <0.4 - will usually have pain in the legs at rest.  <0.25 - usually indicates limb threatening PVD.  Doppler detection of pulses in the legs. This test is painless and checks to see if you have a pulses in your legs/feet.  A dye or contrast material (a substance that highlights the blood vessels so they show up on x-ray) may be given to help your caregiver better see the arteries for the following tests. The dye is eliminated from your body by the kidney's. Your caregiver may order blood work to check your kidney function and other laboratory values before the following tests are performed:  Magnetic Resonance Angiography (MRA). An MRA is a picture study of the blood vessels and arteries. The MRA machine uses a large magnet to produce images of the blood vessels.  Computed Tomography Angiography (CTA). A CTA   is a specialized x-ray that looks at how the blood flows in your blood vessels. An IV may be inserted into your arm so contrast dye can be injected.  Angiogram. Is a procedure that uses x-rays to look at your blood vessels. This procedure is minimally invasive, meaning a small incision (cut) is made in your groin. A small tube (catheter) is then inserted into the artery of your groin. The catheter  is guided to the blood vessel or artery your caregiver wants to examine. Contrast dye is injected into the catheter. X-rays are then taken of the blood vessel or artery. After the images are obtained, the catheter is taken out. TREATMENT  Treatment of PVD involves many interventions which may include:  Lifestyle changes:  Quitting smoking.  Exercise.  Following a low fat, low cholesterol diet.  Control of diabetes.  Foot care is very important to the PVD patient. Good foot care can help prevent infection.  Medication:  Cholesterol-lowering medicine.  Blood pressure medicine.  Anti-platelet drugs.  Certain medicines may reduce symptoms of Intermittent Claudication.  Interventional/Surgical options:  Angioplasty. An Angioplasty is a procedure that inflates a balloon in the blocked artery. This opens the blocked artery to improve blood flow.  Stent Implant. A wire mesh tube (stent) is placed in the artery. The stent expands and stays in place, allowing the artery to remain open.  Peripheral Bypass Surgery. This is a surgical procedure that reroutes the blood around a blocked artery to help improve blood flow. This type of procedure may be performed if Angioplasty or stent implants are not an option. SEEK IMMEDIATE MEDICAL CARE IF:   You develop pain or numbness in your arms or legs.  Your arm or leg turns cold, becomes blue in color.  You develop redness, warmth, swelling and pain in your arms or legs. MAKE SURE YOU:   Understand these instructions.  Will watch your condition.  Will get help right away if you are not doing well or get worse. Document Released: 02/12/2004 Document Revised: 03/29/2011 Document Reviewed: 01/09/2008 [employer] Patient Information 2015 [employer]. This information is not intended to replace advice given to you by your health care provider. Make sure you discuss any questions you have with your health care provider.    Smoking  Cessation Quitting smoking is important to your health and has many advantages. However, it is not always easy to quit since nicotine is a very addictive drug. Oftentimes, people try 3 times or more before being able to quit. This document explains the best ways for you to prepare to quit smoking. Quitting takes hard work and a lot of effort, but you can do it. ADVANTAGES OF QUITTING SMOKING  You will live longer, feel better, and live better.  Your body will feel the impact of quitting smoking almost immediately.  Within 20 minutes, blood pressure decreases. Your pulse returns to its normal level.  After 8 hours, carbon monoxide levels in the blood return to normal. Your oxygen level increases.  After 24 hours, the chance of having a heart attack starts to decrease. Your breath, hair, and body stop smelling like smoke.  After 48 hours, damaged nerve endings begin to recover. Your sense of taste and smell improve.  After 72 hours, the body is virtually free of nicotine. Your bronchial tubes relax and breathing becomes easier.  After 2 to 12 weeks, lungs can hold more air. Exercise becomes easier and circulation improves.  The risk of having a heart attack, stroke,   cancer, or lung disease is greatly reduced.  After 1 year, the risk of coronary heart disease is cut in half.  After 5 years, the risk of stroke falls to the same as a nonsmoker.  After 10 years, the risk of lung cancer is cut in half and the risk of other cancers decreases significantly.  After 15 years, the risk of coronary heart disease drops, usually to the level of a nonsmoker.  If you are pregnant, quitting smoking will improve your chances of having a healthy baby.  The people you live with, especially any children, will be healthier.  You will have extra money to spend on things other than cigarettes. QUESTIONS TO THINK ABOUT BEFORE ATTEMPTING TO QUIT You may want to talk about your answers with your health care  provider.  Why do you want to quit?  If you tried to quit in the past, what helped and what did not?  What will be the most difficult situations for you after you quit? How will you plan to handle them?  Who can help you through the tough times? Your family? Friends? A health care provider?  What pleasures do you get from smoking? What ways can you still get pleasure if you quit? Here are some questions to ask your health care provider:  How can you help me to be successful at quitting?  What medicine do you think would be best for me and how should I take it?  What should I do if I need more help?  What is smoking withdrawal like? How can I get information on withdrawal? GET READY  Set a quit date.  Change your environment by getting rid of all cigarettes, ashtrays, matches, and lighters in your home, car, or work. Do not let people smoke in your home.  Review your past attempts to quit. Think about what worked and what did not. GET SUPPORT AND ENCOURAGEMENT You have a better chance of being successful if you have help. You can get support in many ways.  Tell your family, friends, and coworkers that you are going to quit and need their support. Ask them not to smoke around you.  Get individual, group, or telephone counseling and support. Programs are available at local hospitals and health centers. Call your local health department for information about programs in your area.  Spiritual beliefs and practices may help some smokers quit.  Download a "quit meter" on your computer to keep track of quit statistics, such as how long you have gone without smoking, cigarettes not smoked, and money saved.  Get a self-help book about quitting smoking and staying off tobacco. LEARN NEW SKILLS AND BEHAVIORS  Distract yourself from urges to smoke. Talk to someone, go for a walk, or occupy your time with a task.  Change your normal routine. Take a different route to work. Drink tea  instead of coffee. Eat breakfast in a different place.  Reduce your stress. Take a hot bath, exercise, or read a book.  Plan something enjoyable to do every day. Reward yourself for not smoking.  Explore interactive web-based programs that specialize in helping you quit. GET MEDICINE AND USE IT CORRECTLY Medicines can help you stop smoking and decrease the urge to smoke. Combining medicine with the above behavioral methods and support can greatly increase your chances of successfully quitting smoking.  Nicotine replacement therapy helps deliver nicotine to your body without the negative effects and risks of smoking. Nicotine replacement therapy includes nicotine gum, lozenges,   inhalers, nasal sprays, and skin patches. Some may be available over-the-counter and others require a prescription.  Antidepressant medicine helps people abstain from smoking, but how this works is unknown. This medicine is available by prescription.  Nicotinic receptor partial agonist medicine simulates the effect of nicotine in your brain. This medicine is available by prescription. Ask your health care provider for advice about which medicines to use and how to use them based on your health history. Your health care provider will tell you what side effects to look out for if you choose to be on a medicine or therapy. Carefully read the information on the package. Do not use any other product containing nicotine while using a nicotine replacement product.  RELAPSE OR DIFFICULT SITUATIONS Most relapses occur within the first 3 months after quitting. Do not be discouraged if you start smoking again. Remember, most people try several times before finally quitting. You may have symptoms of withdrawal because your body is used to nicotine. You may crave cigarettes, be irritable, feel very hungry, cough often, get headaches, or have difficulty concentrating. The withdrawal symptoms are only temporary. They are strongest when you  first quit, but they will go away within 10-14 days. To reduce the chances of relapse, try to:  Avoid drinking alcohol. Drinking lowers your chances of successfully quitting.  Reduce the amount of caffeine you consume. Once you quit smoking, the amount of caffeine in your body increases and can give you symptoms, such as a rapid heartbeat, sweating, and anxiety.  Avoid smokers because they can make you want to smoke.  Do not let weight gain distract you. Many smokers will gain weight when they quit, usually less than 10 pounds. Eat a healthy diet and stay active. You can always lose the weight gained after you quit.  Find ways to improve your mood other than smoking. FOR MORE INFORMATION  www.smokefree.gov  Document Released: 12/29/2000 Document Revised: 05/21/2013 Document Reviewed: 04/15/2011 [employer] Patient Information 2015 [employer]. This information is not intended to replace advice given to you by your health care provider. Make sure you discuss any questions you have with your health care provider.    Smoking Cessation, Tips for Success If you are ready to quit smoking, congratulations! You have chosen to help yourself be healthier. Cigarettes bring nicotine, tar, carbon monoxide, and other irritants into your body. Your lungs, heart, and blood vessels will be able to work better without these poisons. There are many different ways to quit smoking. Nicotine gum, nicotine patches, a nicotine inhaler, or nicotine nasal spray can help with physical craving. Hypnosis, support groups, and medicines help break the habit of smoking. WHAT THINGS CAN I DO TO MAKE QUITTING EASIER?  Here are some tips to help you quit for good:  Pick a date when you will quit smoking completely. Tell all of your friends and family about your plan to quit on that date.  Do not try to slowly cut down on the number of cigarettes you are smoking. Pick a quit date and quit smoking completely starting on that  day.  Throw away all cigarettes.   Clean and remove all ashtrays from your home, work, and car.  On a card, write down your reasons for quitting. Carry the card with you and read it when you get the urge to smoke.  Cleanse your body of nicotine. Drink enough water and fluids to keep your urine clear or pale yellow. Do this after quitting to flush the nicotine from   your body.  Learn to predict your moods. Do not let a bad situation be your excuse to have a cigarette. Some situations in your life might tempt you into wanting a cigarette.  Never have "just one" cigarette. It leads to wanting another and another. Remind yourself of your decision to quit.  Change habits associated with smoking. If you smoked while driving or when feeling stressed, try other activities to replace smoking. Stand up when drinking your coffee. Brush your teeth after eating. Sit in a different chair when you read the paper. Avoid alcohol while trying to quit, and try to drink fewer caffeinated beverages. Alcohol and caffeine may urge you to smoke.  Avoid foods and drinks that can trigger a desire to smoke, such as sugary or spicy foods and alcohol.  Ask people who smoke not to smoke around you.  Have something planned to do right after eating or having a cup of coffee. For example, plan to take a walk or exercise.  Try a relaxation exercise to calm you down and decrease your stress. Remember, you may be tense and nervous for the first 2 weeks after you quit, but this will pass.  Find new activities to keep your hands busy. Play with a pen, coin, or rubber band. Doodle or draw things on paper.  Brush your teeth right after eating. This will help cut down on the craving for the taste of tobacco after meals. You can also try mouthwash.   Use oral substitutes in place of cigarettes. Try using lemon drops, carrots, cinnamon sticks, or chewing gum. Keep them handy so they are available when you have the urge to  smoke.  When you have the urge to smoke, try deep breathing.  Designate your home as a nonsmoking area.  If you are a heavy smoker, ask your health care provider about a prescription for nicotine chewing gum. It can ease your withdrawal from nicotine.  Reward yourself. Set aside the cigarette money you save and buy yourself something nice.  Look for support from others. Join a support group or smoking cessation program. Ask someone at home or at work to help you with your plan to quit smoking.  Always ask yourself, "Do I need this cigarette or is this just a reflex?" Tell yourself, "Today, I choose not to smoke," or "I do not want to smoke." You are reminding yourself of your decision to quit.  Do not replace cigarette smoking with electronic cigarettes (commonly called e-cigarettes). The safety of e-cigarettes is unknown, and some may contain harmful chemicals.  If you relapse, do not give up! Plan ahead and think about what you will do the next time you get the urge to smoke. HOW WILL I FEEL WHEN I QUIT SMOKING? You may have symptoms of withdrawal because your body is used to nicotine (the addictive substance in cigarettes). You may crave cigarettes, be irritable, feel very hungry, cough often, get headaches, or have difficulty concentrating. The withdrawal symptoms are only temporary. They are strongest when you first quit but will go away within 10-14 days. When withdrawal symptoms occur, stay in control. Think about your reasons for quitting. Remind yourself that these are signs that your body is healing and getting used to being without cigarettes. Remember that withdrawal symptoms are easier to treat than the major diseases that smoking can cause.  Even after the withdrawal is over, expect periodic urges to smoke. However, these cravings are generally short lived and will go away whether you   smoke or not. Do not smoke! WHAT RESOURCES ARE AVAILABLE TO HELP ME QUIT SMOKING? Your health care  provider can direct you to community resources or hospitals for support, which may include:  Group support.  Education.  Hypnosis.  Therapy. Document Released: 10/03/2003 Document Revised: 05/21/2013 Document Reviewed: 06/22/2012 [employer] Patient Information 2015 [employer]. This information is not intended to replace advice given to you by your health care provider. Make sure you discuss any questions you have with your health care provider.  

## 2013-12-03 NOTE — Progress Notes (Signed)
[facility] [place] HISTORY AND PHYSICAL -PAD  History of Present Illness Alicia [name] is a 73 y.o. female who is status post right femoral to above the knee popliteal artery bypass graft on 01/24/2008 by Dr. Trula Slade. Unfortunately her bypass graft occluded.  She returns today for ABI surveillance of both LE's.  Can walk about 10 minutes before right thigh and calf muscles start to ache, relieved by rest sometimes, but other times not relieved by rest until about 30 minutes later. States she fractured her ankle in 2011 walking on the ice and has a pin in place, she states due to this her thigh and hip has a pullling sensation when she walks.  Denies symptoms of TIA or stroke.  Patient denies non healing ulcers on lower extremities. States her feet stay cold.  Pt reports New Medical or Surgical History: fractured her ankle, had ORIF of right ankle.   Pt Diabetic: No Pt smoker: smoker (1-2 cigarettes/day and nicotine gum, decreased from 1/2 ppd, started at age 60 yrs)  Pt meds include: Statin :Yes Betablocker: No ASA: No, ASA caused stomach burning Other anticoagulants/antiplatelets: Pletal     Past Medical History  Diagnosis Date  . Allergic rhinitis   . Anxiety   . Depression   . Hyperlipidemia   . Hypertension   . Low back pain   . Peptic ulcer disease   . Irritable bowel syndrome with constipation   . Cataract   . Knee fracture   . Migraines   . Osteoarthritis   . DeQuervain's disease (tenosynovitis)   . PVD (peripheral vascular disease)   . Right rotator cuff tear   . COPD (chronic obstructive pulmonary disease)     Social History History  Substance Use Topics  . Smoking status: Current Every Day Smoker -- 1.00 packs/day for 52 years    Types: Cigarettes  . Smokeless tobacco: Never Used     Comment: pt states she smokes about 3-4 cigarettes a day  . Alcohol Use: No     Comment: she quit 1 year ago    Family History Family  History  Problem Relation Age of Onset  . Hypertension Mother   . Heart attack Mother   . Colon cancer Neg Hx     Past Surgical History  Procedure Laterality Date  . Breast tumor    . Back tumor    . Right inguinal herniorraphy    . Right femur fracture    . Right femoral to above the knee popliteal bypass    . Right ankle otif    . Pr vein bypass graft,aorto-fem-pop    . Colonoscopy  12/08/2011    Procedure: COLONOSCOPY;  Surgeon: Daneil Dolin, MD;  [facility];  Service: Endoscopy;  Laterality: N/A;  11:15 AM  . Cataract extraction w/phaco Right 03/13/2012    Procedure: CATARACT EXTRACTION PHACO AND INTRAOCULAR LENS PLACEMENT (IOC);  Surgeon: Tonny Branch, MD;  Location: [facility];  Service: Ophthalmology;  Laterality: Right;  CDE:15.51  . Cataract extraction w/phaco Left 03/27/2012    Procedure: CATARACT EXTRACTION PHACO AND INTRAOCULAR LENS PLACEMENT (IOC);  Surgeon: Tonny Branch, MD;  Location: [facility];  Service: Ophthalmology;  Laterality: Left;  CDE=10.91  . Cyst excision Right July 2015    Right Preauricular area    Allergies  Allergen Reactions  . Aspirin     REACTION: stomach upset    Current Outpatient Prescriptions  Medication Sig Dispense Refill  . amLODipine (NORVASC) 10  MG tablet Take 10 mg by mouth daily.    . calcium-vitamin D (OSCAL WITH D) 500-200 MG-UNIT per tablet Take 1 tablet by mouth daily.      . cilostazol (PLETAL) 100 MG tablet Take 100 mg by mouth 2 (two) times daily.     . fluticasone (VERAMYST) 27.5 MCG/SPRAY nasal spray Place 2 sprays into the nose daily.    Marland Kitchen gabapentin (NEURONTIN) 100 MG capsule Take 100 mg by mouth at bedtime.     Marland Kitchen losartan (COZAAR) 50 MG tablet Take 50 mg by mouth daily.     . mirtazapine (REMERON) 15 MG tablet Take 15 mg by mouth at bedtime.      . risedronate (ACTONEL) 35 MG tablet Take 35 mg by mouth every 7 (seven) days. with water on empty stomach, nothing by mouth or lie down for next 30 minutes.    . simvastatin  (ZOCOR) 20 MG tablet Take 20 mg by mouth at bedtime.      . traMADol-acetaminophen (ULTRACET) 37.5-325 MG per tablet Take 1 tablet by mouth 2 (two) times daily.     Marland Kitchen Besifloxacin HCl (BESIVANCE) 0.6 % SUSP Apply 1 drop to eye 3 (three) times daily.    Sarajane Marek Sodium (STOOL SOFTENER/LAXATIVE PO) Take 100 each by mouth 1 day or 1 dose.      . Difluprednate (DUREZOL) 0.05 % EMUL Apply 1 drop to eye 3 (three) times daily.    . fesoterodine (TOVIAZ) 4 MG TB24 Take 4 mg by mouth daily.    . Multiple Vitamin (MULTIVITAMIN) tablet Take 1 tablet by mouth daily.    . Nepafenac (ILEVRO) 0.3 % SUSP Apply 1 drop to eye daily.    . polyvinyl alcohol-povidone (HYPOTEARS) 1.4-0.6 % ophthalmic solution Place 1-2 drops into both eyes as needed. Dry eyes.    Marland Kitchen zolpidem (AMBIEN) 5 MG tablet Take 1 tablet by mouth at bedtime.     No current facility-administered medications for this visit.   Facility-Administered Medications Ordered in Other Visits  Medication Dose Route Frequency Provider Last Rate Last Dose  . fentaNYL (SUBLIMAZE) injection 25-50 mcg  25-50 mcg Intravenous Q5 min PRN Lerry Liner, MD        ROS: See HPI for pertinent positives and negatives.   Physical Examination  Filed Vitals:   12/03/13 1432  BP: 119/86  Pulse: 108  Resp: 16  Height: 5\' 2"  (1.575 m)  Weight: 108 lb (48.988 kg)  SpO2: 97%   Body mass index is 19.75 kg/(m^2).  General: A&O x 3, WDWN  Gait: normal Eyes: PERRLA, Pulmonary: CTAB, without wheezes , rales or rhonchi, diminished air movement in all fields. Cardiac: Sinus tachycardia at 108 , no detected murmur     Carotid Bruits Left Right   Negative Negative  Radial pulses are 2+ palpable and = Aorta is not palpable   VASCULAR EXAM: Extremities without ischemic changes  without Gangrene; without open wounds.      LE Pulses LEFT RIGHT   FEMORAL 2+ palpable 2+ palpable    POPLITEAL not palpable  not palpable   POSTERIOR TIBIAL not palpable, monophasic by Doppler  not palpable, no Doppler signal    DORSALIS PEDIS  ANTERIOR TIBIAL faintly palpable  not palpable, monophasic by Doppler    PERONEAL monophasic by Doppler, not Palpable   monophasic by Doppler, not Palpable    Abdomen: soft, NT, no palpated masses Skin: no rashes, ulcers noted, old well healed scarring medial aspect of right ankle. Musculoskeletal: no  muscle wasting or atrophy Neurologic: A&O X 3; Appropriate Affect ; SENSATION: normal; MOTOR FUNCTION: moving all extremities equally, M/S 5/5 in upper extremities, 4/5 in lower extremities. Speech is fluent/normal     Non-Invasive Vascular Imaging: DATE: 12/03/2013 ABI: RIGHT 0.79, Waveforms: monophasic, TBI: absent;  LEFT 0.86, Waveforms: bi and monophasic, TBI: absent Previous (09/25/12) ABI's: Right: 0.73, Left: 0.83   ASSESSMENT: Alicia [name] is a 73 y.o. female who who is status post right femoral to above the knee popliteal artery bypass graft on 01/24/2008. Unfortunately her bypass graft occluded. ABI's remain stable at moderate and mild arterial occlusive disease, no tissue loss. She does not walk much to elicit claudication symptoms due to right hip pain post ORIF for fracture. She is trying to decrease her tobacco use. See Plan.   PLAN:  She does not walk much due to right hip pain s/p right hip fracture and ORIF; daily seated leg exercises as demonstrated and discussed.  The patient was counseled re smoking cessation and given several free resources re smoking cessation.  I discussed in depth with the patient the nature of atherosclerosis, and emphasized the importance of maximal medical management  including strict control of blood pressure, blood glucose, and lipid levels, obtaining regular exercise, and cessation of smoking.  The patient is aware that without maximal medical management the underlying atherosclerotic disease process will progress, limiting the benefit of any interventions.  Based on the patient's vascular studies and examination, pt will return to clinic in 1 year for ABI's.  The patient was given information about PAD including signs, symptoms, treatment, what symptoms should prompt the patient to seek immediate medical care, and risk reduction measures to take.  Clemon Chambers, RN, MSN, FNP-C [facility] Phone: [phone]  Clinic MD: Trula Slade  12/03/2013 2:39 PM

## 2014-01-17 ENCOUNTER — Other Ambulatory Visit (HOSPITAL_COMMUNITY): Payer: Self-pay | Admitting: Internal Medicine

## 2014-01-17 DIAGNOSIS — Z1231 Encounter for screening mammogram for malignant neoplasm of breast: Secondary | ICD-10-CM

## 2014-01-21 DIAGNOSIS — D1779 Benign lipomatous neoplasm of other sites: Secondary | ICD-10-CM | POA: Diagnosis not present

## 2014-01-21 DIAGNOSIS — L989 Disorder of the skin and subcutaneous tissue, unspecified: Secondary | ICD-10-CM | POA: Diagnosis not present

## 2014-01-21 DIAGNOSIS — R222 Localized swelling, mass and lump, trunk: Secondary | ICD-10-CM | POA: Diagnosis not present

## 2014-01-21 DIAGNOSIS — R898 Other abnormal findings in specimens from other organs, systems and tissues: Secondary | ICD-10-CM | POA: Diagnosis not present

## 2014-01-21 DIAGNOSIS — D235 Other benign neoplasm of skin of trunk: Secondary | ICD-10-CM | POA: Diagnosis not present

## 2014-01-24 ENCOUNTER — Ambulatory Visit (HOSPITAL_COMMUNITY)
Admission: RE | Admit: 2014-01-24 | Discharge: 2014-01-24 | Disposition: A | Discharge status: Home / Self Care | Payer: Commercial Managed Care - HMO | Source: Ambulatory Visit | Attending: Internal Medicine | Admitting: Internal Medicine

## 2014-01-24 DIAGNOSIS — Z1231 Encounter for screening mammogram for malignant neoplasm of breast: Secondary | ICD-10-CM | POA: Insufficient documentation

## 2014-02-01 DIAGNOSIS — I10 Essential (primary) hypertension: Secondary | ICD-10-CM | POA: Diagnosis not present

## 2014-02-01 DIAGNOSIS — J449 Chronic obstructive pulmonary disease, unspecified: Secondary | ICD-10-CM | POA: Diagnosis not present

## 2014-02-04 DIAGNOSIS — D235 Other benign neoplasm of skin of trunk: Secondary | ICD-10-CM | POA: Diagnosis not present

## 2014-02-13 DIAGNOSIS — H35352 Cystoid macular degeneration, left eye: Secondary | ICD-10-CM | POA: Diagnosis not present

## 2014-02-13 DIAGNOSIS — H20022 Recurrent acute iridocyclitis, left eye: Secondary | ICD-10-CM | POA: Diagnosis not present

## 2014-02-27 DIAGNOSIS — J449 Chronic obstructive pulmonary disease, unspecified: Secondary | ICD-10-CM | POA: Diagnosis not present

## 2014-02-27 DIAGNOSIS — I10 Essential (primary) hypertension: Secondary | ICD-10-CM | POA: Diagnosis not present

## 2014-03-08 DIAGNOSIS — I10 Essential (primary) hypertension: Secondary | ICD-10-CM | POA: Diagnosis not present

## 2014-03-08 DIAGNOSIS — L259 Unspecified contact dermatitis, unspecified cause: Secondary | ICD-10-CM | POA: Diagnosis not present

## 2014-03-08 DIAGNOSIS — J449 Chronic obstructive pulmonary disease, unspecified: Secondary | ICD-10-CM | POA: Diagnosis not present

## 2014-03-13 DIAGNOSIS — H35352 Cystoid macular degeneration, left eye: Secondary | ICD-10-CM | POA: Diagnosis not present

## 2014-04-10 DIAGNOSIS — I10 Essential (primary) hypertension: Secondary | ICD-10-CM | POA: Diagnosis not present

## 2014-04-10 DIAGNOSIS — J449 Chronic obstructive pulmonary disease, unspecified: Secondary | ICD-10-CM | POA: Diagnosis not present

## 2014-04-11 DIAGNOSIS — H35352 Cystoid macular degeneration, left eye: Secondary | ICD-10-CM | POA: Diagnosis not present

## 2014-05-11 DIAGNOSIS — I10 Essential (primary) hypertension: Secondary | ICD-10-CM | POA: Diagnosis not present

## 2014-05-11 DIAGNOSIS — J449 Chronic obstructive pulmonary disease, unspecified: Secondary | ICD-10-CM | POA: Diagnosis not present

## 2014-07-02 DIAGNOSIS — J449 Chronic obstructive pulmonary disease, unspecified: Secondary | ICD-10-CM | POA: Diagnosis not present

## 2014-07-02 DIAGNOSIS — I739 Peripheral vascular disease, unspecified: Secondary | ICD-10-CM | POA: Diagnosis not present

## 2014-07-02 DIAGNOSIS — I10 Essential (primary) hypertension: Secondary | ICD-10-CM | POA: Diagnosis not present

## 2014-07-02 DIAGNOSIS — F17200 Nicotine dependence, unspecified, uncomplicated: Secondary | ICD-10-CM | POA: Diagnosis not present

## 2014-07-25 DIAGNOSIS — H35352 Cystoid macular degeneration, left eye: Secondary | ICD-10-CM | POA: Diagnosis not present

## 2014-08-03 DIAGNOSIS — J449 Chronic obstructive pulmonary disease, unspecified: Secondary | ICD-10-CM | POA: Diagnosis not present

## 2014-08-03 DIAGNOSIS — I10 Essential (primary) hypertension: Secondary | ICD-10-CM | POA: Diagnosis not present

## 2014-08-22 DIAGNOSIS — H35352 Cystoid macular degeneration, left eye: Secondary | ICD-10-CM | POA: Diagnosis not present

## 2014-09-25 DIAGNOSIS — H35352 Cystoid macular degeneration, left eye: Secondary | ICD-10-CM | POA: Diagnosis not present

## 2014-09-30 DIAGNOSIS — J449 Chronic obstructive pulmonary disease, unspecified: Secondary | ICD-10-CM | POA: Diagnosis not present

## 2014-09-30 DIAGNOSIS — Z720 Tobacco use: Secondary | ICD-10-CM | POA: Diagnosis not present

## 2014-09-30 DIAGNOSIS — M159 Polyosteoarthritis, unspecified: Secondary | ICD-10-CM | POA: Diagnosis not present

## 2014-09-30 DIAGNOSIS — Z23 Encounter for immunization: Secondary | ICD-10-CM | POA: Diagnosis not present

## 2014-09-30 DIAGNOSIS — E784 Other hyperlipidemia: Secondary | ICD-10-CM | POA: Diagnosis not present

## 2014-09-30 DIAGNOSIS — I739 Peripheral vascular disease, unspecified: Secondary | ICD-10-CM | POA: Diagnosis not present

## 2014-09-30 DIAGNOSIS — I10 Essential (primary) hypertension: Secondary | ICD-10-CM | POA: Diagnosis not present

## 2014-09-30 DIAGNOSIS — I951 Orthostatic hypotension: Secondary | ICD-10-CM | POA: Diagnosis not present

## 2014-10-08 DIAGNOSIS — I10 Essential (primary) hypertension: Secondary | ICD-10-CM | POA: Diagnosis not present

## 2014-10-08 DIAGNOSIS — F17200 Nicotine dependence, unspecified, uncomplicated: Secondary | ICD-10-CM | POA: Diagnosis not present

## 2014-10-08 DIAGNOSIS — J449 Chronic obstructive pulmonary disease, unspecified: Secondary | ICD-10-CM | POA: Diagnosis not present

## 2014-10-08 DIAGNOSIS — R42 Dizziness and giddiness: Secondary | ICD-10-CM | POA: Diagnosis not present

## 2014-11-05 DIAGNOSIS — I10 Essential (primary) hypertension: Secondary | ICD-10-CM | POA: Diagnosis not present

## 2014-11-05 DIAGNOSIS — J449 Chronic obstructive pulmonary disease, unspecified: Secondary | ICD-10-CM | POA: Diagnosis not present

## 2014-11-05 DIAGNOSIS — E784 Other hyperlipidemia: Secondary | ICD-10-CM | POA: Diagnosis not present

## 2014-11-05 DIAGNOSIS — Z23 Encounter for immunization: Secondary | ICD-10-CM | POA: Diagnosis not present

## 2014-11-05 DIAGNOSIS — I739 Peripheral vascular disease, unspecified: Secondary | ICD-10-CM | POA: Diagnosis not present

## 2014-11-25 DIAGNOSIS — H02836 Dermatochalasis of left eye, unspecified eyelid: Secondary | ICD-10-CM | POA: Diagnosis not present

## 2014-11-25 DIAGNOSIS — H02831 Dermatochalasis of right upper eyelid: Secondary | ICD-10-CM | POA: Diagnosis not present

## 2014-11-26 DIAGNOSIS — H02834 Dermatochalasis of left upper eyelid: Secondary | ICD-10-CM | POA: Diagnosis not present

## 2014-11-26 DIAGNOSIS — H02831 Dermatochalasis of right upper eyelid: Secondary | ICD-10-CM | POA: Diagnosis not present

## 2014-12-06 DIAGNOSIS — I10 Essential (primary) hypertension: Secondary | ICD-10-CM | POA: Diagnosis not present

## 2014-12-06 DIAGNOSIS — J449 Chronic obstructive pulmonary disease, unspecified: Secondary | ICD-10-CM | POA: Diagnosis not present

## 2014-12-09 ENCOUNTER — Ambulatory Visit: Payer: Medicare HMO | Admitting: Family

## 2014-12-09 ENCOUNTER — Encounter (HOSPITAL_COMMUNITY): Payer: Medicare HMO

## 2014-12-31 ENCOUNTER — Other Ambulatory Visit (HOSPITAL_COMMUNITY): Payer: Self-pay | Admitting: Internal Medicine

## 2014-12-31 DIAGNOSIS — J449 Chronic obstructive pulmonary disease, unspecified: Secondary | ICD-10-CM | POA: Diagnosis not present

## 2014-12-31 DIAGNOSIS — I739 Peripheral vascular disease, unspecified: Secondary | ICD-10-CM | POA: Diagnosis not present

## 2014-12-31 DIAGNOSIS — I729 Aneurysm of unspecified site: Secondary | ICD-10-CM

## 2014-12-31 DIAGNOSIS — I10 Essential (primary) hypertension: Secondary | ICD-10-CM | POA: Diagnosis not present

## 2014-12-31 DIAGNOSIS — M1990 Unspecified osteoarthritis, unspecified site: Secondary | ICD-10-CM

## 2014-12-31 DIAGNOSIS — F331 Major depressive disorder, recurrent, moderate: Secondary | ICD-10-CM | POA: Diagnosis not present

## 2014-12-31 DIAGNOSIS — Z122 Encounter for screening for malignant neoplasm of respiratory organs: Secondary | ICD-10-CM

## 2015-01-02 DIAGNOSIS — H26493 Other secondary cataract, bilateral: Secondary | ICD-10-CM | POA: Diagnosis not present

## 2015-01-02 DIAGNOSIS — Z961 Presence of intraocular lens: Secondary | ICD-10-CM | POA: Diagnosis not present

## 2015-01-02 DIAGNOSIS — H35352 Cystoid macular degeneration, left eye: Secondary | ICD-10-CM | POA: Diagnosis not present

## 2015-01-06 DIAGNOSIS — H02834 Dermatochalasis of left upper eyelid: Secondary | ICD-10-CM | POA: Diagnosis not present

## 2015-01-06 DIAGNOSIS — H02831 Dermatochalasis of right upper eyelid: Secondary | ICD-10-CM | POA: Diagnosis not present

## 2015-01-08 ENCOUNTER — Encounter: Payer: Self-pay | Admitting: Family

## 2015-01-15 ENCOUNTER — Encounter (HOSPITAL_COMMUNITY): Payer: Medicare HMO

## 2015-01-15 ENCOUNTER — Ambulatory Visit: Payer: Medicare HMO | Admitting: Family

## 2015-01-23 ENCOUNTER — Other Ambulatory Visit (HOSPITAL_COMMUNITY): Payer: Self-pay | Admitting: Internal Medicine

## 2015-01-23 ENCOUNTER — Ambulatory Visit (HOSPITAL_COMMUNITY)
Admission: RE | Admit: 2015-01-23 | Discharge: 2015-01-23 | Disposition: A | Discharge status: Home / Self Care | Payer: Commercial Managed Care - HMO | Source: Ambulatory Visit | Attending: Internal Medicine | Admitting: Internal Medicine

## 2015-01-23 DIAGNOSIS — I700 Atherosclerosis of aorta: Secondary | ICD-10-CM | POA: Diagnosis not present

## 2015-01-23 DIAGNOSIS — Z122 Encounter for screening for malignant neoplasm of respiratory organs: Secondary | ICD-10-CM | POA: Diagnosis not present

## 2015-01-23 DIAGNOSIS — M810 Age-related osteoporosis without current pathological fracture: Secondary | ICD-10-CM | POA: Insufficient documentation

## 2015-01-23 DIAGNOSIS — M1990 Unspecified osteoarthritis, unspecified site: Secondary | ICD-10-CM

## 2015-01-23 DIAGNOSIS — Z780 Asymptomatic menopausal state: Secondary | ICD-10-CM | POA: Diagnosis not present

## 2015-01-23 DIAGNOSIS — Z87891 Personal history of nicotine dependence: Secondary | ICD-10-CM | POA: Diagnosis not present

## 2015-01-23 DIAGNOSIS — F17210 Nicotine dependence, cigarettes, uncomplicated: Secondary | ICD-10-CM | POA: Diagnosis not present

## 2015-01-23 DIAGNOSIS — I729 Aneurysm of unspecified site: Secondary | ICD-10-CM

## 2015-01-23 DIAGNOSIS — F17219 Nicotine dependence, cigarettes, with unspecified nicotine-induced disorders: Secondary | ICD-10-CM | POA: Diagnosis not present

## 2015-01-23 DIAGNOSIS — I2510 Atherosclerotic heart disease of native coronary artery without angina pectoris: Secondary | ICD-10-CM | POA: Insufficient documentation

## 2015-01-24 ENCOUNTER — Ambulatory Visit (HOSPITAL_COMMUNITY): Payer: Commercial Managed Care - HMO

## 2015-01-31 DIAGNOSIS — J449 Chronic obstructive pulmonary disease, unspecified: Secondary | ICD-10-CM | POA: Diagnosis not present

## 2015-01-31 DIAGNOSIS — I10 Essential (primary) hypertension: Secondary | ICD-10-CM | POA: Diagnosis not present

## 2015-02-18 DIAGNOSIS — H2640 Unspecified secondary cataract: Secondary | ICD-10-CM | POA: Diagnosis not present

## 2015-02-18 DIAGNOSIS — H26491 Other secondary cataract, right eye: Secondary | ICD-10-CM | POA: Diagnosis not present

## 2015-02-18 DIAGNOSIS — R6889 Other general symptoms and signs: Secondary | ICD-10-CM | POA: Diagnosis not present

## 2015-03-03 DIAGNOSIS — I10 Essential (primary) hypertension: Secondary | ICD-10-CM | POA: Diagnosis not present

## 2015-03-03 DIAGNOSIS — F17200 Nicotine dependence, unspecified, uncomplicated: Secondary | ICD-10-CM | POA: Diagnosis not present

## 2015-03-31 DIAGNOSIS — J449 Chronic obstructive pulmonary disease, unspecified: Secondary | ICD-10-CM | POA: Diagnosis not present

## 2015-03-31 DIAGNOSIS — I10 Essential (primary) hypertension: Secondary | ICD-10-CM | POA: Diagnosis not present

## 2015-05-01 DIAGNOSIS — I10 Essential (primary) hypertension: Secondary | ICD-10-CM | POA: Diagnosis not present

## 2015-05-01 DIAGNOSIS — J449 Chronic obstructive pulmonary disease, unspecified: Secondary | ICD-10-CM | POA: Diagnosis not present

## 2015-05-15 DIAGNOSIS — I10 Essential (primary) hypertension: Secondary | ICD-10-CM | POA: Diagnosis not present

## 2015-05-15 DIAGNOSIS — H5210 Myopia, unspecified eye: Secondary | ICD-10-CM | POA: Diagnosis not present

## 2015-05-15 DIAGNOSIS — Z0100 Encounter for examination of eyes and vision without abnormal findings: Secondary | ICD-10-CM | POA: Diagnosis not present

## 2015-05-31 DIAGNOSIS — F17200 Nicotine dependence, unspecified, uncomplicated: Secondary | ICD-10-CM | POA: Diagnosis not present

## 2015-05-31 DIAGNOSIS — I10 Essential (primary) hypertension: Secondary | ICD-10-CM | POA: Diagnosis not present

## 2015-07-03 DIAGNOSIS — F331 Major depressive disorder, recurrent, moderate: Secondary | ICD-10-CM | POA: Diagnosis not present

## 2015-07-03 DIAGNOSIS — J449 Chronic obstructive pulmonary disease, unspecified: Secondary | ICD-10-CM | POA: Diagnosis not present

## 2015-07-03 DIAGNOSIS — F17200 Nicotine dependence, unspecified, uncomplicated: Secondary | ICD-10-CM | POA: Diagnosis not present

## 2015-07-03 DIAGNOSIS — I739 Peripheral vascular disease, unspecified: Secondary | ICD-10-CM | POA: Diagnosis not present

## 2015-07-03 DIAGNOSIS — I10 Essential (primary) hypertension: Secondary | ICD-10-CM | POA: Diagnosis not present

## 2015-07-31 DIAGNOSIS — H20012 Primary iridocyclitis, left eye: Secondary | ICD-10-CM | POA: Diagnosis not present

## 2015-08-03 DIAGNOSIS — J449 Chronic obstructive pulmonary disease, unspecified: Secondary | ICD-10-CM | POA: Diagnosis not present

## 2015-08-03 DIAGNOSIS — I10 Essential (primary) hypertension: Secondary | ICD-10-CM | POA: Diagnosis not present

## 2015-08-08 DIAGNOSIS — H20012 Primary iridocyclitis, left eye: Secondary | ICD-10-CM | POA: Diagnosis not present

## 2015-09-03 DIAGNOSIS — I10 Essential (primary) hypertension: Secondary | ICD-10-CM | POA: Diagnosis not present

## 2015-09-03 DIAGNOSIS — J449 Chronic obstructive pulmonary disease, unspecified: Secondary | ICD-10-CM | POA: Diagnosis not present

## 2015-11-02 DIAGNOSIS — J449 Chronic obstructive pulmonary disease, unspecified: Secondary | ICD-10-CM | POA: Diagnosis not present

## 2015-11-02 DIAGNOSIS — I10 Essential (primary) hypertension: Secondary | ICD-10-CM | POA: Diagnosis not present

## 2015-12-03 DIAGNOSIS — I10 Essential (primary) hypertension: Secondary | ICD-10-CM | POA: Diagnosis not present

## 2015-12-03 DIAGNOSIS — F17200 Nicotine dependence, unspecified, uncomplicated: Secondary | ICD-10-CM | POA: Diagnosis not present

## 2016-01-05 DIAGNOSIS — Z23 Encounter for immunization: Secondary | ICD-10-CM | POA: Diagnosis not present

## 2016-01-05 DIAGNOSIS — F17200 Nicotine dependence, unspecified, uncomplicated: Secondary | ICD-10-CM | POA: Diagnosis not present

## 2016-01-05 DIAGNOSIS — E784 Other hyperlipidemia: Secondary | ICD-10-CM | POA: Diagnosis not present

## 2016-01-05 DIAGNOSIS — F17210 Nicotine dependence, cigarettes, uncomplicated: Secondary | ICD-10-CM | POA: Diagnosis not present

## 2016-01-05 DIAGNOSIS — F17218 Nicotine dependence, cigarettes, with other nicotine-induced disorders: Secondary | ICD-10-CM | POA: Diagnosis not present

## 2016-01-05 DIAGNOSIS — I10 Essential (primary) hypertension: Secondary | ICD-10-CM | POA: Diagnosis not present

## 2016-01-05 DIAGNOSIS — F331 Major depressive disorder, recurrent, moderate: Secondary | ICD-10-CM | POA: Diagnosis not present

## 2016-01-05 DIAGNOSIS — Z0000 Encounter for general adult medical examination without abnormal findings: Secondary | ICD-10-CM | POA: Diagnosis not present

## 2016-01-05 DIAGNOSIS — M159 Polyosteoarthritis, unspecified: Secondary | ICD-10-CM | POA: Diagnosis not present

## 2016-01-05 DIAGNOSIS — Z720 Tobacco use: Secondary | ICD-10-CM | POA: Diagnosis not present

## 2016-01-05 DIAGNOSIS — I739 Peripheral vascular disease, unspecified: Secondary | ICD-10-CM | POA: Diagnosis not present

## 2016-01-05 DIAGNOSIS — J449 Chronic obstructive pulmonary disease, unspecified: Secondary | ICD-10-CM | POA: Diagnosis not present

## 2016-02-05 DIAGNOSIS — J449 Chronic obstructive pulmonary disease, unspecified: Secondary | ICD-10-CM | POA: Diagnosis not present

## 2016-02-05 DIAGNOSIS — I10 Essential (primary) hypertension: Secondary | ICD-10-CM | POA: Diagnosis not present

## 2016-03-08 DIAGNOSIS — F17200 Nicotine dependence, unspecified, uncomplicated: Secondary | ICD-10-CM | POA: Diagnosis not present

## 2016-03-08 DIAGNOSIS — J449 Chronic obstructive pulmonary disease, unspecified: Secondary | ICD-10-CM | POA: Diagnosis not present

## 2016-04-04 DIAGNOSIS — J449 Chronic obstructive pulmonary disease, unspecified: Secondary | ICD-10-CM | POA: Diagnosis not present

## 2016-04-04 DIAGNOSIS — I10 Essential (primary) hypertension: Secondary | ICD-10-CM | POA: Diagnosis not present

## 2016-04-28 ENCOUNTER — Other Ambulatory Visit (HOSPITAL_COMMUNITY): Payer: Self-pay | Admitting: Internal Medicine

## 2016-04-28 DIAGNOSIS — Z1231 Encounter for screening mammogram for malignant neoplasm of breast: Secondary | ICD-10-CM

## 2016-05-05 ENCOUNTER — Ambulatory Visit (HOSPITAL_COMMUNITY)
Admission: RE | Admit: 2016-05-05 | Discharge: 2016-05-05 | Disposition: A | Discharge status: Home / Self Care | Payer: Commercial Managed Care - HMO | Source: Ambulatory Visit | Attending: Internal Medicine | Admitting: Internal Medicine

## 2016-05-05 DIAGNOSIS — J449 Chronic obstructive pulmonary disease, unspecified: Secondary | ICD-10-CM | POA: Diagnosis not present

## 2016-05-05 DIAGNOSIS — Z1231 Encounter for screening mammogram for malignant neoplasm of breast: Secondary | ICD-10-CM | POA: Diagnosis not present

## 2016-05-05 DIAGNOSIS — I10 Essential (primary) hypertension: Secondary | ICD-10-CM | POA: Diagnosis not present

## 2016-05-06 ENCOUNTER — Other Ambulatory Visit (HOSPITAL_COMMUNITY): Payer: Self-pay | Admitting: Internal Medicine

## 2016-05-06 DIAGNOSIS — R928 Other abnormal and inconclusive findings on diagnostic imaging of breast: Secondary | ICD-10-CM

## 2016-06-01 ENCOUNTER — Ambulatory Visit (HOSPITAL_COMMUNITY)
Admission: RE | Admit: 2016-06-01 | Discharge: 2016-06-01 | Disposition: A | Discharge status: Home / Self Care | Payer: Commercial Managed Care - HMO | Source: Ambulatory Visit | Attending: Internal Medicine | Admitting: Internal Medicine

## 2016-06-01 DIAGNOSIS — R928 Other abnormal and inconclusive findings on diagnostic imaging of breast: Secondary | ICD-10-CM | POA: Diagnosis not present

## 2016-06-01 DIAGNOSIS — R922 Inconclusive mammogram: Secondary | ICD-10-CM | POA: Diagnosis not present

## 2016-06-04 DIAGNOSIS — J449 Chronic obstructive pulmonary disease, unspecified: Secondary | ICD-10-CM | POA: Diagnosis not present

## 2016-06-04 DIAGNOSIS — I10 Essential (primary) hypertension: Secondary | ICD-10-CM | POA: Diagnosis not present

## 2016-06-10 DIAGNOSIS — I10 Essential (primary) hypertension: Secondary | ICD-10-CM | POA: Diagnosis not present

## 2016-06-10 DIAGNOSIS — H5200 Hypermetropia, unspecified eye: Secondary | ICD-10-CM | POA: Diagnosis not present

## 2016-07-05 DIAGNOSIS — I10 Essential (primary) hypertension: Secondary | ICD-10-CM | POA: Diagnosis not present

## 2016-07-05 DIAGNOSIS — J449 Chronic obstructive pulmonary disease, unspecified: Secondary | ICD-10-CM | POA: Diagnosis not present

## 2016-07-29 DIAGNOSIS — J449 Chronic obstructive pulmonary disease, unspecified: Secondary | ICD-10-CM | POA: Diagnosis not present

## 2016-07-29 DIAGNOSIS — F17210 Nicotine dependence, cigarettes, uncomplicated: Secondary | ICD-10-CM | POA: Diagnosis not present

## 2016-07-29 DIAGNOSIS — F331 Major depressive disorder, recurrent, moderate: Secondary | ICD-10-CM | POA: Diagnosis not present

## 2016-07-29 DIAGNOSIS — I739 Peripheral vascular disease, unspecified: Secondary | ICD-10-CM | POA: Diagnosis not present

## 2016-07-29 DIAGNOSIS — I10 Essential (primary) hypertension: Secondary | ICD-10-CM | POA: Diagnosis not present

## 2016-09-29 DIAGNOSIS — I10 Essential (primary) hypertension: Secondary | ICD-10-CM | POA: Diagnosis not present

## 2016-09-29 DIAGNOSIS — J449 Chronic obstructive pulmonary disease, unspecified: Secondary | ICD-10-CM | POA: Diagnosis not present

## 2016-10-29 DIAGNOSIS — I10 Essential (primary) hypertension: Secondary | ICD-10-CM | POA: Diagnosis not present

## 2016-10-29 DIAGNOSIS — J449 Chronic obstructive pulmonary disease, unspecified: Secondary | ICD-10-CM | POA: Diagnosis not present

## 2016-11-29 DIAGNOSIS — I739 Peripheral vascular disease, unspecified: Secondary | ICD-10-CM | POA: Diagnosis not present

## 2016-11-29 DIAGNOSIS — J449 Chronic obstructive pulmonary disease, unspecified: Secondary | ICD-10-CM | POA: Diagnosis not present

## 2016-12-29 DIAGNOSIS — I10 Essential (primary) hypertension: Secondary | ICD-10-CM | POA: Diagnosis not present

## 2016-12-29 DIAGNOSIS — J449 Chronic obstructive pulmonary disease, unspecified: Secondary | ICD-10-CM | POA: Diagnosis not present

## 2017-03-17 DIAGNOSIS — I10 Essential (primary) hypertension: Secondary | ICD-10-CM | POA: Diagnosis not present

## 2017-03-17 DIAGNOSIS — J449 Chronic obstructive pulmonary disease, unspecified: Secondary | ICD-10-CM | POA: Diagnosis not present

## 2017-04-27 DIAGNOSIS — I10 Essential (primary) hypertension: Secondary | ICD-10-CM | POA: Diagnosis not present

## 2017-04-27 DIAGNOSIS — J449 Chronic obstructive pulmonary disease, unspecified: Secondary | ICD-10-CM | POA: Diagnosis not present

## 2017-05-27 DIAGNOSIS — J449 Chronic obstructive pulmonary disease, unspecified: Secondary | ICD-10-CM | POA: Diagnosis not present

## 2017-05-27 DIAGNOSIS — I10 Essential (primary) hypertension: Secondary | ICD-10-CM | POA: Diagnosis not present

## 2017-06-27 DIAGNOSIS — J449 Chronic obstructive pulmonary disease, unspecified: Secondary | ICD-10-CM | POA: Diagnosis not present

## 2017-06-27 DIAGNOSIS — I10 Essential (primary) hypertension: Secondary | ICD-10-CM | POA: Diagnosis not present

## 2017-08-16 ENCOUNTER — Other Ambulatory Visit (HOSPITAL_COMMUNITY): Payer: Self-pay | Admitting: Internal Medicine

## 2017-08-16 DIAGNOSIS — R55 Syncope and collapse: Secondary | ICD-10-CM

## 2017-08-16 DIAGNOSIS — I10 Essential (primary) hypertension: Secondary | ICD-10-CM

## 2017-08-16 DIAGNOSIS — J449 Chronic obstructive pulmonary disease, unspecified: Secondary | ICD-10-CM | POA: Diagnosis not present

## 2017-08-17 ENCOUNTER — Other Ambulatory Visit (HOSPITAL_COMMUNITY): Payer: Self-pay | Admitting: Internal Medicine

## 2017-08-17 DIAGNOSIS — R55 Syncope and collapse: Secondary | ICD-10-CM

## 2017-08-23 ENCOUNTER — Other Ambulatory Visit: Payer: Self-pay | Admitting: Obstetrics and Gynecology

## 2017-08-23 ENCOUNTER — Ambulatory Visit (HOSPITAL_COMMUNITY)
Admission: RE | Admit: 2017-08-23 | Discharge: 2017-08-23 | Disposition: A | Discharge status: Home / Self Care | Payer: Medicare Other | Source: Ambulatory Visit | Attending: Internal Medicine | Admitting: Internal Medicine

## 2017-08-23 DIAGNOSIS — Z720 Tobacco use: Secondary | ICD-10-CM | POA: Diagnosis not present

## 2017-08-23 DIAGNOSIS — R55 Syncope and collapse: Secondary | ICD-10-CM | POA: Insufficient documentation

## 2017-08-23 DIAGNOSIS — I10 Essential (primary) hypertension: Secondary | ICD-10-CM | POA: Insufficient documentation

## 2017-08-23 DIAGNOSIS — E785 Hyperlipidemia, unspecified: Secondary | ICD-10-CM | POA: Diagnosis not present

## 2017-08-23 DIAGNOSIS — Z1231 Encounter for screening mammogram for malignant neoplasm of breast: Secondary | ICD-10-CM

## 2017-08-23 NOTE — Progress Notes (Signed)
*  PRELIMINARY RESULTS* Echocardiogram 2D Echocardiogram has been performed.  Alicia [name] 08/23/2017, 2:38 PM

## 2017-09-16 DIAGNOSIS — J449 Chronic obstructive pulmonary disease, unspecified: Secondary | ICD-10-CM | POA: Diagnosis not present

## 2017-09-16 DIAGNOSIS — I10 Essential (primary) hypertension: Secondary | ICD-10-CM | POA: Diagnosis not present

## 2017-10-26 DIAGNOSIS — J449 Chronic obstructive pulmonary disease, unspecified: Secondary | ICD-10-CM | POA: Diagnosis not present

## 2017-10-26 DIAGNOSIS — I10 Essential (primary) hypertension: Secondary | ICD-10-CM | POA: Diagnosis not present

## 2017-12-06 ENCOUNTER — Other Ambulatory Visit: Payer: Self-pay

## 2017-12-06 NOTE — Patient Outreach (Signed)
[place] [facility]) Care Management  12/06/2017  Alicia [name] Jul 16, 1940 [id number]   Medication Adherence call to Alicia [name] left a message for patient to call back patient is due on Losartan/ HCTZ 100/12.5 mg. Alicia [name] is showing past due under [place].   [place] Management Direct Dial [phone]  Fax [phone] [name]

## 2017-12-17 DIAGNOSIS — I10 Essential (primary) hypertension: Secondary | ICD-10-CM | POA: Diagnosis not present

## 2017-12-17 DIAGNOSIS — J449 Chronic obstructive pulmonary disease, unspecified: Secondary | ICD-10-CM | POA: Diagnosis not present

## 2018-01-16 DIAGNOSIS — J449 Chronic obstructive pulmonary disease, unspecified: Secondary | ICD-10-CM | POA: Diagnosis not present

## 2018-01-16 DIAGNOSIS — I10 Essential (primary) hypertension: Secondary | ICD-10-CM | POA: Diagnosis not present

## 2018-02-13 DIAGNOSIS — I739 Peripheral vascular disease, unspecified: Secondary | ICD-10-CM | POA: Diagnosis not present

## 2018-02-13 DIAGNOSIS — Z0000 Encounter for general adult medical examination without abnormal findings: Secondary | ICD-10-CM | POA: Diagnosis not present

## 2018-02-13 DIAGNOSIS — Z720 Tobacco use: Secondary | ICD-10-CM | POA: Diagnosis not present

## 2018-02-13 DIAGNOSIS — I10 Essential (primary) hypertension: Secondary | ICD-10-CM | POA: Diagnosis not present

## 2018-02-13 DIAGNOSIS — J449 Chronic obstructive pulmonary disease, unspecified: Secondary | ICD-10-CM | POA: Diagnosis not present

## 2018-03-16 DIAGNOSIS — I739 Peripheral vascular disease, unspecified: Secondary | ICD-10-CM | POA: Diagnosis not present

## 2018-03-16 DIAGNOSIS — I10 Essential (primary) hypertension: Secondary | ICD-10-CM | POA: Diagnosis not present

## 2018-04-14 DIAGNOSIS — J449 Chronic obstructive pulmonary disease, unspecified: Secondary | ICD-10-CM | POA: Diagnosis not present

## 2018-04-14 DIAGNOSIS — I10 Essential (primary) hypertension: Secondary | ICD-10-CM | POA: Diagnosis not present

## 2018-05-15 DIAGNOSIS — J449 Chronic obstructive pulmonary disease, unspecified: Secondary | ICD-10-CM | POA: Diagnosis not present

## 2018-05-15 DIAGNOSIS — I10 Essential (primary) hypertension: Secondary | ICD-10-CM | POA: Diagnosis not present

## 2018-05-16 ENCOUNTER — Other Ambulatory Visit: Payer: Self-pay

## 2018-05-16 NOTE — Patient Outreach (Signed)
[place] [facility]) Care Management  05/16/2018  Alicia [name] November 07, 1940 [id number]   Medication Adherence call to Mrs. [name] spoke with patient she is due on Losartan/Hctz 100/12.5 mg and Simvastatin 20 mg patient explain she had already received  Call from [facility] and did not want it to go over her medications again. Mrs. Pollett is showing past due under [place].   [place] Management Direct Dial [phone]  Fax [phone] [email or web address]

## 2018-06-14 DIAGNOSIS — I10 Essential (primary) hypertension: Secondary | ICD-10-CM | POA: Diagnosis not present

## 2018-06-14 DIAGNOSIS — J449 Chronic obstructive pulmonary disease, unspecified: Secondary | ICD-10-CM | POA: Diagnosis not present

## 2018-07-15 DIAGNOSIS — J449 Chronic obstructive pulmonary disease, unspecified: Secondary | ICD-10-CM | POA: Diagnosis not present

## 2018-07-15 DIAGNOSIS — E7849 Other hyperlipidemia: Secondary | ICD-10-CM | POA: Diagnosis not present

## 2018-08-28 DIAGNOSIS — I10 Essential (primary) hypertension: Secondary | ICD-10-CM | POA: Diagnosis not present

## 2018-08-28 DIAGNOSIS — J449 Chronic obstructive pulmonary disease, unspecified: Secondary | ICD-10-CM | POA: Diagnosis not present

## 2018-08-28 DIAGNOSIS — I739 Peripheral vascular disease, unspecified: Secondary | ICD-10-CM | POA: Diagnosis not present

## 2018-09-20 ENCOUNTER — Other Ambulatory Visit: Payer: Self-pay

## 2018-09-20 NOTE — Patient Outreach (Signed)
[facility]) Care Management  09/20/2018  Alicia [name] 26-Jul-1940 [id number]   Medication Adherence call to [name] spoke with patient she is past due on Losartan 50 mg and Simvastatin 20 mg patient ask if we can call [facility] she has enough to hold her off but patient is going to need it.[facility] said she owes $14 dollars and can not fill her medication until she pays the balance.patient said she will mail out the payment and to give her a call back next week to help her order all her medications.[name] is showing past due under [place].   [place] Management Direct Dial [phone]  Fax [phone] [name]

## 2018-09-28 DIAGNOSIS — J449 Chronic obstructive pulmonary disease, unspecified: Secondary | ICD-10-CM | POA: Diagnosis not present

## 2018-09-28 DIAGNOSIS — I10 Essential (primary) hypertension: Secondary | ICD-10-CM | POA: Diagnosis not present

## 2018-10-28 DIAGNOSIS — E7849 Other hyperlipidemia: Secondary | ICD-10-CM | POA: Diagnosis not present

## 2018-10-28 DIAGNOSIS — I10 Essential (primary) hypertension: Secondary | ICD-10-CM | POA: Diagnosis not present

## 2018-11-28 DIAGNOSIS — M159 Polyosteoarthritis, unspecified: Secondary | ICD-10-CM | POA: Diagnosis not present

## 2018-11-28 DIAGNOSIS — I10 Essential (primary) hypertension: Secondary | ICD-10-CM | POA: Diagnosis not present

## 2018-12-28 DIAGNOSIS — I10 Essential (primary) hypertension: Secondary | ICD-10-CM | POA: Diagnosis not present

## 2018-12-28 DIAGNOSIS — M159 Polyosteoarthritis, unspecified: Secondary | ICD-10-CM | POA: Diagnosis not present

## 2019-02-07 DIAGNOSIS — Z0001 Encounter for general adult medical examination with abnormal findings: Secondary | ICD-10-CM | POA: Diagnosis not present

## 2019-02-07 DIAGNOSIS — Z1389 Encounter for screening for other disorder: Secondary | ICD-10-CM | POA: Diagnosis not present

## 2019-02-07 DIAGNOSIS — J449 Chronic obstructive pulmonary disease, unspecified: Secondary | ICD-10-CM | POA: Diagnosis not present

## 2019-02-07 DIAGNOSIS — I10 Essential (primary) hypertension: Secondary | ICD-10-CM | POA: Diagnosis not present

## 2019-02-15 DIAGNOSIS — Z0001 Encounter for general adult medical examination with abnormal findings: Secondary | ICD-10-CM | POA: Diagnosis not present

## 2019-02-15 DIAGNOSIS — E7849 Other hyperlipidemia: Secondary | ICD-10-CM | POA: Diagnosis not present

## 2019-02-15 DIAGNOSIS — I10 Essential (primary) hypertension: Secondary | ICD-10-CM | POA: Diagnosis not present

## 2019-02-15 DIAGNOSIS — J449 Chronic obstructive pulmonary disease, unspecified: Secondary | ICD-10-CM | POA: Diagnosis not present

## 2019-03-10 DIAGNOSIS — I10 Essential (primary) hypertension: Secondary | ICD-10-CM | POA: Diagnosis not present

## 2019-03-10 DIAGNOSIS — M159 Polyosteoarthritis, unspecified: Secondary | ICD-10-CM | POA: Diagnosis not present

## 2019-04-09 DIAGNOSIS — E7849 Other hyperlipidemia: Secondary | ICD-10-CM | POA: Diagnosis not present

## 2019-04-09 DIAGNOSIS — I10 Essential (primary) hypertension: Secondary | ICD-10-CM | POA: Diagnosis not present

## 2019-04-26 DIAGNOSIS — R636 Underweight: Secondary | ICD-10-CM | POA: Diagnosis not present

## 2019-04-26 DIAGNOSIS — R32 Unspecified urinary incontinence: Secondary | ICD-10-CM | POA: Diagnosis not present

## 2019-04-26 DIAGNOSIS — G8929 Other chronic pain: Secondary | ICD-10-CM | POA: Diagnosis not present

## 2019-04-26 DIAGNOSIS — J449 Chronic obstructive pulmonary disease, unspecified: Secondary | ICD-10-CM | POA: Diagnosis not present

## 2019-04-26 DIAGNOSIS — I10 Essential (primary) hypertension: Secondary | ICD-10-CM | POA: Diagnosis not present

## 2019-04-26 DIAGNOSIS — M810 Age-related osteoporosis without current pathological fracture: Secondary | ICD-10-CM | POA: Diagnosis not present

## 2019-04-26 DIAGNOSIS — I739 Peripheral vascular disease, unspecified: Secondary | ICD-10-CM | POA: Diagnosis not present

## 2019-04-26 DIAGNOSIS — H547 Unspecified visual loss: Secondary | ICD-10-CM | POA: Diagnosis not present

## 2019-04-26 DIAGNOSIS — R69 Illness, unspecified: Secondary | ICD-10-CM | POA: Diagnosis not present

## 2019-04-26 DIAGNOSIS — E785 Hyperlipidemia, unspecified: Secondary | ICD-10-CM | POA: Diagnosis not present

## 2019-05-10 DIAGNOSIS — M159 Polyosteoarthritis, unspecified: Secondary | ICD-10-CM | POA: Diagnosis not present

## 2019-05-10 DIAGNOSIS — I10 Essential (primary) hypertension: Secondary | ICD-10-CM | POA: Diagnosis not present

## 2019-05-14 ENCOUNTER — Ambulatory Visit: Payer: Medicare HMO

## 2019-05-14 ENCOUNTER — Encounter: Payer: Self-pay | Admitting: Orthopedic Surgery

## 2019-05-14 ENCOUNTER — Ambulatory Visit (INDEPENDENT_AMBULATORY_CARE_PROVIDER_SITE_OTHER): Payer: Medicare HMO | Admitting: Orthopedic Surgery

## 2019-05-14 ENCOUNTER — Other Ambulatory Visit: Payer: Self-pay

## 2019-05-14 VITALS — BP 173/119 | HR 111 | Ht 62.0 in | Wt 88.0 lb

## 2019-05-14 DIAGNOSIS — G8929 Other chronic pain: Secondary | ICD-10-CM

## 2019-05-14 DIAGNOSIS — M25561 Pain in right knee: Secondary | ICD-10-CM | POA: Diagnosis not present

## 2019-05-14 DIAGNOSIS — M5441 Lumbago with sciatica, right side: Secondary | ICD-10-CM | POA: Diagnosis not present

## 2019-05-14 MED ORDER — METHYLPREDNISOLONE ACETATE 40 MG/ML IJ SUSP
40.0000 mg | Freq: Once | INTRAMUSCULAR | Status: AC
  Administered 2019-05-14: 11:00:00 40 mg via INTRAMUSCULAR

## 2019-05-14 NOTE — Patient Instructions (Signed)
You have scoliosis causing the back and leg pain at night   The screws in the knee are causing the pain there

## 2019-05-14 NOTE — Progress Notes (Signed)
NEW PROBLEM//OFFICE VISIT  Chief Complaint  Patient presents with  . Knee Pain    right/ getting worse     79 year old female status post intramedullary nailing right femur and [facility] she says late 40s presents with painful right knee.  She says she got up a few days ago and something felt like it came loose  But her more significant complaints are 2 months of lower back pain with bilateral posterior thigh pain radiating down mainly at night when she is in the recumbent position she is on gabapentin 100 mg at night and Ultracet 1 twice daily which is not relieving those symptoms     Review of Systems  Constitutional: Positive for weight loss.  Respiratory: Positive for wheezing.   Gastrointestinal: Positive for nausea and vomiting.  Musculoskeletal: Positive for back pain, falls and joint pain.  Neurological: Positive for dizziness.  Endo/Heme/Allergies: Bruises/bleeds easily.  Psychiatric/Behavioral: Positive for depression and memory loss.  All other systems reviewed and are negative.    Past Medical History:  Diagnosis Date  . Allergic rhinitis   . Anxiety   . Cataract   . COPD (chronic obstructive pulmonary disease) ([place])   . Depression   . DeQuervain's disease (tenosynovitis)   . Hyperlipidemia   . Hypertension   . Irritable bowel syndrome with constipation   . Knee fracture   . Low back pain   . Migraines   . Osteoarthritis   . Peptic ulcer disease   . PVD (peripheral vascular disease) ([place])   . Right rotator cuff tear     Past Surgical History:  Procedure Laterality Date  . back tumor    . breast tumor    . CATARACT EXTRACTION W/PHACO Right 03/13/2012   Procedure: CATARACT EXTRACTION PHACO AND INTRAOCULAR LENS PLACEMENT (IOC);  Surgeon: Tonny Branch, MD;  Location: [facility];  Service: Ophthalmology;  Laterality: Right;  CDE:15.51  . CATARACT EXTRACTION W/PHACO Left 03/27/2012   Procedure: CATARACT EXTRACTION PHACO AND INTRAOCULAR LENS PLACEMENT (IOC);   Surgeon: Tonny Branch, MD;  Location: [facility];  Service: Ophthalmology;  Laterality: Left;  CDE=10.91  . COLONOSCOPY  12/08/2011   Procedure: COLONOSCOPY;  Surgeon: Daneil Dolin, MD;  Location: [facility];  Service: Endoscopy;  Laterality: N/A;  11:15 AM  . CYST EXCISION Right July 2015   Right Preauricular area  . PR VEIN BYPASS GRAFT,AORTO-FEM-POP    . right ankle otif    . right femoral to above the knee popliteal bypass    . right femur fracture    . right inguinal herniorraphy      Family History  Problem Relation Age of Onset  . Hypertension Mother   . Heart attack Mother   . Colon cancer Neg Hx    Social History   Tobacco Use  . Smoking status: Current Every Day Smoker    Packs/day: 1.00    Years: 52.00    Pack years: 52.00    Types: Cigarettes  . Smokeless tobacco: Never Used  . Tobacco comment: pt states she smokes about 3-4 cigarettes a day  Substance Use Topics  . Alcohol use: No    Comment: she quit 1 year ago  . Drug use: No    Allergies  Allergen Reactions  . Aspirin     REACTION: stomach upset    Current Meds  Medication Sig  . amLODipine (NORVASC) 10 MG tablet Take 10 mg by mouth daily.  Sarajane Marek Sodium (STOOL SOFTENER/LAXATIVE PO) Take 100 each by  mouth 1 day or 1 dose.    . cilostazol (PLETAL) 100 MG tablet Take 100 mg by mouth 2 (two) times daily.   . fluticasone (VERAMYST) 27.5 MCG/SPRAY nasal spray Place 2 sprays into the nose daily.  Marland Kitchen gabapentin (NEURONTIN) 100 MG capsule Take 100 mg by mouth at bedtime.   Marland Kitchen losartan (COZAAR) 50 MG tablet Take 50 mg by mouth daily.   . Multiple Vitamin (MULTIVITAMIN) tablet Take 1 tablet by mouth daily.  . polyvinyl alcohol-povidone (HYPOTEARS) 1.4-0.6 % ophthalmic solution Place 1-2 drops into both eyes as needed. Dry eyes.  . risedronate (ACTONEL) 35 MG tablet Take 35 mg by mouth every 7 (seven) days. with water on empty stomach, nothing by mouth or lie down for next 30 minutes.  .  simvastatin (ZOCOR) 20 MG tablet Take 20 mg by mouth at bedtime.    . traMADol (ULTRAM) 50 MG tablet Take 50 mg by mouth 2 (two) times daily as needed.  . traMADol-acetaminophen (ULTRACET) 37.5-325 MG per tablet Take 1 tablet by mouth 2 (two) times daily.   Marland Kitchen zolpidem (AMBIEN) 5 MG tablet Take 1 tablet by mouth at bedtime.    BP (!) 173/119   Pulse (!) 111   Ht 5\' 2"  (1.575 m)   Wt 88 lb (39.9 kg)   BMI 16.10 kg/m   Physical Exam Small frame ectomorphic  Awake alert and oriented x3  Mood affect normal   Ortho Exam  Gait normal  Lumbar spine tender midline right side left side.  Straight leg raise is negative.  Neurovascular exam intact with no focal findings  Painful right knee over the screws on the head of each screw and medially over the points of the screws.  Mild tenderness medial joint line no effusion with full range of motion strength and stability normal    MEDICAL DECISION MAKING  A.  Encounter Diagnoses  Name Primary?  . Chronic pain of right knee Yes  . Right-sided low back pain with right-sided sciatica, unspecified chronicity    X-rays of the knee show the nail in place the nail abuts right against the femoral cortex of the joint it also appears to be just inside the dorsal margin on the lateral x-ray.  There is mild arthritis in the knee joint  The back shows moderate scoliosis in the lumbar spine with disc disease at L5-S1 with narrowing and then anterior osteophytes of L3-S1  Alicia [name] is 79 years old she is very thin the nail was done at [facility] probably an old cloverleaf type nail.  I do not have the hardware removal devices to get those screws out so I would like to wait on any surgery regarding that  Regarding her back pain and leg pain it appears that she has spinal stenosis and we will treat her with increasing her gabapentin 100 mg 2 tablets at night continue her Ultracet as she is taking give her an IM shot of Depo-Medrol  Meds ordered this encounter   Medications  . methylPREDNISolone acetate (DEPO-MEDROL) injection 40 mg    IM shot depomedrol  Intramuscular injection procedure note  The patient has complained of pain in the  Back and both legs when in bed at night   We have decided that the best treatment option to give the patient pain relief is to do an intramuscular injection of Depo-Medrol   The patient gave verbal consent, timeout was taken as appropriate. The nurse injected    40Mg  of Depo-Medrol into the  Hip/gluteal    Chronic problem with injection and prescription drug management  Arther Abbott, MD  05/14/2019 11:09 AM

## 2019-06-09 DIAGNOSIS — I739 Peripheral vascular disease, unspecified: Secondary | ICD-10-CM | POA: Diagnosis not present

## 2019-06-09 DIAGNOSIS — I10 Essential (primary) hypertension: Secondary | ICD-10-CM | POA: Diagnosis not present

## 2019-06-15 ENCOUNTER — Ambulatory Visit (INDEPENDENT_AMBULATORY_CARE_PROVIDER_SITE_OTHER): Payer: Medicare HMO | Admitting: Orthopedic Surgery

## 2019-06-15 ENCOUNTER — Other Ambulatory Visit: Payer: Self-pay

## 2019-06-15 ENCOUNTER — Encounter: Payer: Self-pay | Admitting: Orthopedic Surgery

## 2019-06-15 VITALS — Ht 61.0 in | Wt 89.2 lb

## 2019-06-15 DIAGNOSIS — R634 Abnormal weight loss: Secondary | ICD-10-CM

## 2019-06-15 DIAGNOSIS — T8484XS Pain due to internal orthopedic prosthetic devices, implants and grafts, sequela: Secondary | ICD-10-CM | POA: Diagnosis not present

## 2019-06-15 DIAGNOSIS — M5441 Lumbago with sciatica, right side: Secondary | ICD-10-CM | POA: Diagnosis not present

## 2019-06-15 NOTE — Progress Notes (Signed)
Chief Complaint  Patient presents with  . Knee Pain    R/hurts medicine helps some  . Back Pain    some better    Mrs. Gerhart 79 year old female had intramedullary nailing at [facility] has painful hardware right knee from the distal locking screws  Her lower back pain has responded fairly well to gabapentin and tramadol   Examination of the right knee reveals tenderness over the medial and lateral prominent screws.  Range of motion in the knee seems fairly good in the knee is stable  She also complains of abdominal pain weight loss abnormal bowel movements  She says she has had several ultrasounds at the hospital that were negative  We are making referral to GI  We are putting in a request for archived records from [facility] to determine the type of nail so that we can get appropriate hardware to remove the screws   Encounter Diagnoses  Name Primary?  . Weight loss Yes  . Pain due to internal orthopedic prosthetic devices, implants and grafts, sequela   . Right-sided low back pain with right-sided sciatica, unspecified chronicity     Patient will continue the tramadol and gabapentin for the back pain again referral is made for the weight loss and abdominal pain

## 2019-06-15 NOTE — Patient Instructions (Addendum)
Take the tramadol and gabapentin for the back and knee pain    We need to get the records for [facility] that has the nail info   Smoking Tobacco Information, Adult Smoking tobacco can be harmful to your health. Tobacco contains a poisonous (toxic), colorless chemical called nicotine. Nicotine is addictive. It changes the brain and can make it hard to stop smoking. Tobacco also has other toxic chemicals that can hurt your body and raise your risk of many cancers. How can smoking tobacco affect me? Smoking tobacco puts you at risk for:  Cancer. Smoking is most commonly associated with lung cancer, but can also lead to cancer in other parts of the body.  Chronic obstructive pulmonary disease (COPD). This is a long-term lung condition that makes it hard to breathe. It also gets worse over time.  High blood pressure (hypertension), heart disease, stroke, or heart attack.  Lung infections, such as pneumonia.  Cataracts. This is when the lenses in the eyes become clouded.  Digestive problems. This may include peptic ulcers, heartburn, and gastroesophageal reflux disease (GERD).  Oral health problems, such as gum disease and tooth loss.  Loss of taste and smell. Smoking can affect your appearance by causing:  Wrinkles.  Yellow or stained teeth, fingers, and fingernails. Smoking tobacco can also affect your social life, because:  It may be challenging to find places to smoke when away from home. Many workplaces, [employer], hotels, and public places are tobacco-free.  Smoking is expensive. This is due to the cost of tobacco and the long-term costs of treating health problems from smoking.  Secondhand smoke may affect those around you. Secondhand smoke can cause lung cancer, breathing problems, and heart disease. Children of smokers have a higher risk for: ? Sudden infant death syndrome (SIDS). ? Ear infections. ? Lung infections. If you currently smoke tobacco, quitting now  can help you:  Lead a longer and healthier life.  Look, smell, breathe, and feel better over time.  Save money.  Protect others from the harms of secondhand smoke. What actions can I take to prevent health problems? Quit smoking   Do not start smoking. Quit if you already do.  Make a plan to quit smoking and commit to it. Look for programs to help you and ask your health care provider for recommendations and ideas.  Set a date and write down all the reasons you want to quit.  Let your friends and family know you are quitting so they can help and support you. Consider finding friends who also want to quit. It can be easier to quit with someone else, so that you can support each other.  Talk with your health care provider about using nicotine replacement medicines to help you quit, such as gum, lozenges, patches, sprays, or pills.  Do not replace cigarette smoking with electronic cigarettes, which are commonly called e-cigarettes. The safety of e-cigarettes is not known, and some may contain harmful chemicals.  If you try to quit but return to smoking, stay positive. It is common to slip up when you first quit, so take it one day at a time.  Be prepared for cravings. When you feel the urge to smoke, chew gum or suck on hard candy. Lifestyle  Stay busy and take care of your body.  Drink enough fluid to keep your urine pale yellow.  Get plenty of exercise and eat a healthy diet. This can help prevent weight gain after quitting.  Monitor your eating habits. Quitting  smoking can cause you to have a larger appetite than when you smoke.  Find ways to relax. Go out with friends or family to a movie or a restaurant where people do not smoke.  Ask your health care provider about having regular tests (screenings) to check for cancer. This may include blood tests, imaging tests, and other tests.  Find ways to manage your stress, such as meditation, yoga, or exercise. Where to find  support To get support to quit smoking, consider:  Asking your health care provider for more information and resources.  Taking classes to learn more about quitting smoking.  Looking for local organizations that offer resources about quitting smoking.  Joining a support group for people who want to quit smoking in your local community.  Calling the smokefree.gov counselor helpline: 1-800-Quit-Now 604-379-0323) Where to find more information You may find more information about quitting smoking from:  HelpGuide.org: www.helpguide.org  https://hall.com/: smokefree.gov  American Lung Association: www.lung.org Contact a health care provider if you:  Have problems breathing.  Notice that your lips, nose, or fingers turn blue.  Have chest pain.  Are coughing up blood.  Feel faint or you pass out.  Have other health changes that cause you to worry. Summary  Smoking tobacco can negatively affect your health, the health of those around you, your finances, and your social life.  Do not start smoking. Quit if you already do. If you need help quitting, ask your health care provider.  Think about joining a support group for people who want to quit smoking in your local community. There are many effective programs that will help you to quit this behavior. This information is not intended to replace advice given to you by your health care provider. Make sure you discuss any questions you have with your health care provider. Document Revised: 09/29/2018 Document Reviewed: 01/20/2016 Elsevier Patient Education  2020 [employer].

## 2019-06-21 ENCOUNTER — Encounter (INDEPENDENT_AMBULATORY_CARE_PROVIDER_SITE_OTHER): Payer: Self-pay | Admitting: Gastroenterology

## 2019-07-10 DIAGNOSIS — J449 Chronic obstructive pulmonary disease, unspecified: Secondary | ICD-10-CM | POA: Diagnosis not present

## 2019-07-10 DIAGNOSIS — I10 Essential (primary) hypertension: Secondary | ICD-10-CM | POA: Diagnosis not present

## 2019-07-26 ENCOUNTER — Encounter: Payer: Self-pay | Admitting: Emergency Medicine

## 2019-07-26 ENCOUNTER — Ambulatory Visit (INDEPENDENT_AMBULATORY_CARE_PROVIDER_SITE_OTHER): Payer: Medicare HMO | Admitting: Gastroenterology

## 2019-07-26 ENCOUNTER — Telehealth (INDEPENDENT_AMBULATORY_CARE_PROVIDER_SITE_OTHER): Payer: Self-pay

## 2019-07-26 ENCOUNTER — Encounter (INDEPENDENT_AMBULATORY_CARE_PROVIDER_SITE_OTHER): Payer: Self-pay | Admitting: Gastroenterology

## 2019-07-26 ENCOUNTER — Ambulatory Visit
Admission: EM | Admit: 2019-07-26 | Discharge: 2019-07-26 | Disposition: A | Discharge status: Home / Self Care | Payer: Medicare HMO | Attending: Emergency Medicine | Admitting: Emergency Medicine

## 2019-07-26 ENCOUNTER — Encounter (INDEPENDENT_AMBULATORY_CARE_PROVIDER_SITE_OTHER): Payer: Self-pay

## 2019-07-26 ENCOUNTER — Other Ambulatory Visit: Payer: Self-pay

## 2019-07-26 DIAGNOSIS — R509 Fever, unspecified: Secondary | ICD-10-CM | POA: Insufficient documentation

## 2019-07-26 DIAGNOSIS — Z1152 Encounter for screening for COVID-19: Secondary | ICD-10-CM

## 2019-07-26 MED ORDER — ACETAMINOPHEN 500 MG PO TABS: 500.0000 mg | ORAL_TABLET | Freq: Four times a day (QID) | ORAL | 0 refills | Status: DC | PRN

## 2019-07-26 NOTE — Telephone Encounter (Signed)
Tried to called patient , her voicemail was full.

## 2019-07-26 NOTE — Telephone Encounter (Signed)
-----   Message from Johnston Ebbs sent at 07/26/2019  2:42 PM EDT ----- I have tried to call Alicia [name] this afternoon.  No answer and mailbox is full.  Can one of you please check on her.  On my calendar to check her Covid test by Monday and reschedule her for next week--she will have to use [facility] so she has to give notice.  Thanks  Reba

## 2019-07-26 NOTE — ED Triage Notes (Signed)
Pt went to GI doctor this morning for appointment for some ongoing stomach issues.  Pt had temp of 100.5 and HR was 121.  Pt sent to urgent care for r/o covid.  Pt states she has not taken any fever reducer.

## 2019-07-26 NOTE — Progress Notes (Signed)
Patient was found to be febrile up to 100.5 and tachycardic to 121 during initial triaging. She reported having potential exposure to a friend that passed away recently form COVID-28. Due to concern for COVID-19 infection, the patient was redirected to urgent care for immediate testing and evaluation of other acute causes of fever.  The patient will be rescheduled for evaluation of weight loss once she is medical cleared from potential Covid infection.  I personally called Ms. Davidovich primary care office.  Dr. Rosita Fire was not available at the moment, I left a message with his secretary regarding Ms. Greenhaw status and to call back to discuss if needed.  Harvel Quale, MD Gastroenterology and Hepatology

## 2019-07-26 NOTE — ED Provider Notes (Signed)
[facility]   [id number] 07/26/19 Arrival Time: 7654   Chief Complaint  Patient presents with  . Fever     SUBJECTIVE: History from: patient.  Alicia [name] is a 79 y.o. female who presented to the urgent care this morning after visiting her GI doctor with a complaint of fever.  Patient was sent by the GI doctor to come to the urgent care have a Covid test done.  Temp and the GI office was 100.5 F.  Temperature at the urgent care is 98.9 F.  Denies any precipitating event or recent exposure to Covid, flu or strep.  Denies recent travel. Denies  fatigue, otalgia, sinus pain, nasal congestion, rhinorrhea, sore throat, cough, SOB, wheezing, chest pain, nausea, changes in bowel or bladder habits.      ROS: As per HPI.  All other pertinent ROS negative.     Past Medical History:  Diagnosis Date  . Allergic rhinitis   . Anxiety   . Cataract   . COPD (chronic obstructive pulmonary disease) ([place])   . Depression   . DeQuervain's disease (tenosynovitis)   . Hyperlipidemia   . Hypertension   . Irritable bowel syndrome with constipation   . Knee fracture   . Low back pain   . Migraines   . Osteoarthritis   . Peptic ulcer disease   . PVD (peripheral vascular disease) ([place])   . Right rotator cuff tear    Past Surgical History:  Procedure Laterality Date  . back tumor    . breast tumor    . CATARACT EXTRACTION W/PHACO Right 03/13/2012   Procedure: CATARACT EXTRACTION PHACO AND INTRAOCULAR LENS PLACEMENT (IOC);  Surgeon: Tonny Branch, MD;  Location: [facility];  Service: Ophthalmology;  Laterality: Right;  CDE:15.51  . CATARACT EXTRACTION W/PHACO Left 03/27/2012   Procedure: CATARACT EXTRACTION PHACO AND INTRAOCULAR LENS PLACEMENT (IOC);  Surgeon: Tonny Branch, MD;  Location: [facility];  Service: Ophthalmology;  Laterality: Left;  CDE=10.91  . COLONOSCOPY  12/08/2011   Procedure: COLONOSCOPY;  Surgeon: Daneil Dolin, MD;  Location: [facility];  Service: Endoscopy;  Laterality:  N/A;  11:15 AM  . CYST EXCISION Right July 2015   Right Preauricular area  . PR VEIN BYPASS GRAFT,AORTO-FEM-POP    . right ankle otif    . right femoral to above the knee popliteal bypass    . right femur fracture    . right inguinal herniorraphy     Allergies  Allergen Reactions  . Aspirin     REACTION: stomach upset   Current Facility-Administered Medications on File Prior to Encounter  Medication Dose Route Frequency Provider Last Rate Last Admin  . fentaNYL (SUBLIMAZE) injection 25-50 mcg  25-50 mcg Intravenous Q5 min PRN Lerry Liner, MD       Current Outpatient Medications on File Prior to Encounter  Medication Sig Dispense Refill  . amLODipine (NORVASC) 10 MG tablet Take 10 mg by mouth daily.    Marland Kitchen Besifloxacin HCl (BESIVANCE) 0.6 % SUSP Apply 1 drop to eye 3 (three) times daily.    . calcium-vitamin D (OSCAL WITH D) 500-200 MG-UNIT per tablet Take 1 tablet by mouth daily.   (Patient not taking: Reported on 07/26/2019)    . Casanthranol-Docusate Sodium (STOOL SOFTENER/LAXATIVE PO) Take 100 each by mouth 1 day or 1 dose.   (Patient not taking: Reported on 07/26/2019)    . cilostazol (PLETAL) 100 MG tablet Take 100 mg by mouth 2 (two) times daily.     Marland Kitchen  Difluprednate (DUREZOL) 0.05 % EMUL Apply 1 drop to eye 3 (three) times daily. (Patient not taking: Reported on 07/26/2019)    . fesoterodine (TOVIAZ) 4 MG TB24 Take 4 mg by mouth daily.    . fluticasone (VERAMYST) 27.5 MCG/SPRAY nasal spray Place 2 sprays into the nose daily. (Patient not taking: Reported on 07/26/2019)    . gabapentin (NEURONTIN) 100 MG capsule Take 100 mg by mouth at bedtime.     Marland Kitchen losartan (COZAAR) 50 MG tablet Take 50 mg by mouth daily.     . mirtazapine (REMERON) 15 MG tablet Take 15 mg by mouth at bedtime.      . Multiple Vitamin (MULTIVITAMIN) tablet Take 1 tablet by mouth daily. (Patient not taking: Reported on 07/26/2019)    . Nepafenac (ILEVRO) 0.3 % SUSP Apply 1 drop to eye daily. (Patient not taking: Reported  on 07/26/2019)    . oxybutynin (DITROPAN-XL) 10 MG 24 hr tablet Take 10 mg by mouth at bedtime.    . polyvinyl alcohol-povidone (HYPOTEARS) 1.4-0.6 % ophthalmic solution Place 1-2 drops into both eyes as needed. Dry eyes. (Patient not taking: Reported on 07/26/2019)    . risedronate (ACTONEL) 35 MG tablet Take 35 mg by mouth every 7 (seven) days. with water on empty stomach, nothing by mouth or lie down for next 30 minutes.    . simvastatin (ZOCOR) 20 MG tablet Take 20 mg by mouth at bedtime.      . traMADol (ULTRAM) 50 MG tablet Take 50 mg by mouth 2 (two) times daily as needed.    . traMADol-acetaminophen (ULTRACET) 37.5-325 MG per tablet Take 1 tablet by mouth 2 (two) times daily.     Marland Kitchen zolpidem (AMBIEN) 5 MG tablet Take 1 tablet by mouth at bedtime.     Social History   Socioeconomic History  . Marital status: Single    Spouse name: Not on file  . Number of children: Not on file  . Years of education: Not on file  . Highest education level: Not on file  Occupational History  . Not on file  Tobacco Use  . Smoking status: Current Every Day Smoker    Packs/day: 1.00    Years: 52.00    Pack years: 52.00    Types: Cigarettes  . Smokeless tobacco: Never Used  . Tobacco comment: pt states she smokes about 3-4 cigarettes a day  Vaping Use  . Vaping Use: Never assessed  Substance and Sexual Activity  . Alcohol use: No    Comment: she quit 1 year ago  . Drug use: No  . Sexual activity: Not on file  Other Topics Concern  . Not on file  Social History Narrative  . Not on file   Social Determinants of Health   Financial Resource Strain:   . Difficulty of Paying Living Expenses:   Food Insecurity:   . Worried About Charity fundraiser in the Last Year:   . Arboriculturist in the Last Year:   Transportation Needs:   . Film/video editor (Medical):   Marland Kitchen Lack of Transportation (Non-Medical):   Physical Activity:   . Days of Exercise per Week:   . Minutes of Exercise per Session:    Stress:   . Feeling of Stress :   Social Connections:   . Frequency of Communication with Friends and Family:   . Frequency of Social Gatherings with Friends and Family:   . Attends Religious Services:   . Active Member of  Clubs or Organizations:   . Attends Archivist Meetings:   Marland Kitchen Marital Status:   Intimate Partner Violence:   . Fear of Current or Ex-Partner:   . Emotionally Abused:   Marland Kitchen Physically Abused:   . Sexually Abused:    Family History  Problem Relation Age of Onset  . Hypertension Mother   . Heart attack Mother   . Colon cancer Neg Hx     OBJECTIVE:  Vitals:   07/26/19 1056 07/26/19 1058  BP: (!) 163/84   Pulse: (!) 109   Resp: 18   Temp: 98.9 F (37.2 C)   TempSrc: Oral   SpO2: 95%   Weight:  83 lb 12.4 oz (38 kg)  Height:  5\' 1"  (1.549 m)     General appearance: alert; appears fatigued, but nontoxic; speaking in full sentences and tolerating own secretions HEENT: NCAT; Ears: EACs clear, TMs pearly gray; Eyes: PERRL.  EOM grossly intact. Sinuses: nontender; Nose: nares patent without rhinorrhea, Throat: oropharynx clear, tonsils non erythematous or enlarged, uvula midline  Neck: supple without LAD Lungs: unlabored respirations, symmetrical air entry; cough: absent; no respiratory distress; CTAB Heart: regular rate and rhythm.  Radial pulses 2+ symmetrical bilaterally Skin: warm and dry Psychological: alert and cooperative; normal mood and affect  LABS:  No results found for this or any previous visit (from the past 24 hour(s)).   ASSESSMENT & PLAN:  1. Fever, unspecified   2. Encounter for screening for COVID-19     Meds ordered this encounter  Medications  . acetaminophen (TYLENOL) 500 MG tablet    Sig: Take 1 tablet (500 mg total) by mouth every 6 (six) hours as needed.    Dispense:  30 tablet    Refill:  0   Patient is stable at discharge with no fever.  COVID-19 test was completed.  She was advised to take Tylenol as needed for  fever  Discharge Instructions  COVID testing ordered.  It will take between 2-7 days for test results.  Someone will contact you regarding abnormal results.    In the meantime: You should remain isolated in your home for 10 days from symptom onset AND greater than 24 hours after symptoms resolution (absence of fever without the use of fever-reducing medication and improvement in respiratory symptoms), whichever is longer Get plenty of rest and push fluids Use medications daily for symptom relief Use OTC medications like ibuprofen or tylenol as needed fever or pain Call or go to the ED if you have any new or worsening symptoms such as fever, worsening cough, shortness of breath, chest tightness, chest pain, turning blue, changes in mental status, etc...   Reviewed expectations re: course of current medical issues. Questions answered. Outlined signs and symptoms indicating need for more acute intervention. Patient verbalized understanding. After Visit Summary given.      Note: This document was prepared using Dragon voice recognition software and may include unintentional dictation errors.    Emerson Monte, FNP 07/26/19 1136

## 2019-07-26 NOTE — Discharge Instructions (Signed)
COVID testing ordered.  It will take between 2-7 days for test results.  Someone will contact you regarding abnormal results.    In the meantime: You should remain isolated in your home for 10 days from symptom onset AND greater than 24 hours after symptoms resolution (absence of fever without the use of fever-reducing medication and improvement in respiratory symptoms), whichever is longer Get plenty of rest and push fluids Use medications daily for symptom relief Use OTC medications like ibuprofen or tylenol as needed fever or pain Call or go to the ED if you have any new or worsening symptoms such as fever, worsening cough, shortness of breath, chest tightness, chest pain, turning blue, changes in mental status, etc

## 2019-07-27 LAB — NOVEL CORONAVIRUS, NAA: SARS-CoV-2, NAA: NOT DETECTED

## 2019-07-27 LAB — SARS-COV-2, NAA 2 DAY TAT

## 2019-08-09 ENCOUNTER — Encounter (INDEPENDENT_AMBULATORY_CARE_PROVIDER_SITE_OTHER): Payer: Self-pay | Admitting: Gastroenterology

## 2019-08-09 ENCOUNTER — Other Ambulatory Visit (INDEPENDENT_AMBULATORY_CARE_PROVIDER_SITE_OTHER): Payer: Self-pay | Admitting: *Deleted

## 2019-08-09 ENCOUNTER — Encounter (INDEPENDENT_AMBULATORY_CARE_PROVIDER_SITE_OTHER): Payer: Self-pay | Admitting: *Deleted

## 2019-08-09 ENCOUNTER — Ambulatory Visit (INDEPENDENT_AMBULATORY_CARE_PROVIDER_SITE_OTHER): Payer: Medicare HMO | Admitting: Gastroenterology

## 2019-08-09 ENCOUNTER — Other Ambulatory Visit: Payer: Self-pay

## 2019-08-09 VITALS — BP 201/130 | HR 116 | Temp 100.1°F | Ht 61.0 in | Wt 80.9 lb

## 2019-08-09 DIAGNOSIS — R590 Localized enlarged lymph nodes: Secondary | ICD-10-CM | POA: Insufficient documentation

## 2019-08-09 DIAGNOSIS — R6881 Early satiety: Secondary | ICD-10-CM

## 2019-08-09 DIAGNOSIS — R509 Fever, unspecified: Secondary | ICD-10-CM | POA: Diagnosis not present

## 2019-08-09 DIAGNOSIS — R634 Abnormal weight loss: Secondary | ICD-10-CM

## 2019-08-09 NOTE — Progress Notes (Signed)
Maylon Peppers, M.D. Gastroenterology & Hepatology [facility] [address] [place], [place] [address] Primary Care Physician: Rosita Fire, MD [address] [place] [address]  Referring MD: Arther Abbott, MD  I will communicate my assessment and recommendations to the referring MD via EMR. Note: Occasional unusual wording and randomly placed punctuation marks may result from the use of speech recognition technology to transcribe this document"  Chief Complaint: Weight loss  History of Present Illness: Alicia [name] is a 79 y.o. female with past medical history of COPD, hyperlipidemia, hypertension, migraines, peripheral vascular disease, who presents for evaluation of unintentional weight loss.  The patient is a poor historian.  She reports that she has had progressive weight loss and she feels that "she is on her bones at this moment". Upon review of her medical record her most recent registered weight in November 2015 was 108 pounds, while today she is 80 pounds.  Of note, on 07/26/2019 she was 83 pounds.  The patient states that she has had fluctuating mid abdominal pain described as a cramping pressure that does not radiate anywhere else for the last 2 to 3 months.  She is states that she has had occasional episodes of vomiting and constant nausea.  Her vomiting episodes cause her to have worsening abdominal pain.  She reports that the last time she vomited was today in the morning, she describes it as a episode of regurgitation of foam but yesterday she vomited three times.  She also states that she has had intermittent episodes of fever for the last month for which she has had to take Tylenol.  Has felt decreased appetite and early satiety.  Reports that food doesn't taste good this in the past.  The patient denies having any hematochezia, melena, hematemesis, abdominal distention, diarrhea, jaundice,  pruritus.  Notably, the patient was found to be febrile 2 weeks ago in our GI clinic, reason for which she was sent to an urgent care to have Covid testing as she had a potential exposure.  PCR came back negative.  The patient has been chronically taking tramadol for back pain, at least for the last 4 years.  No recent blood testing available in her medical record.  No recent abdominal imaging is available.  Last EGD: never Last colonoscopy was performed in 12/08/2011 which showed a normal exam.  FHx: neg for any gastrointestinal/liver disease, no malignancies Social: The patient is an active smoker since age 49 but states that she stopped in early July given worsening symptoms described above used to smoke 4 to 5 packs/week, denies alcohol or illicit drug use Surgical: Inguinal hernia  Past Medical History: Past Medical History:  Diagnosis Date  . Allergic rhinitis   . Anxiety   . Cataract   . COPD (chronic obstructive pulmonary disease) ([place])   . Depression   . DeQuervain's disease (tenosynovitis)   . Hyperlipidemia   . Hypertension   . Irritable bowel syndrome with constipation   . Knee fracture   . Low back pain   . Migraines   . Osteoarthritis   . Peptic ulcer disease   . PVD (peripheral vascular disease) ([place])   . Right rotator cuff tear     Past Surgical History: Past Surgical History:  Procedure Laterality Date  . back tumor    . breast tumor    . CATARACT EXTRACTION W/PHACO Right 03/13/2012   Procedure: CATARACT EXTRACTION PHACO AND INTRAOCULAR LENS PLACEMENT (IOC);  Surgeon:  Tonny Branch, MD;  Location: [facility];  Service: Ophthalmology;  Laterality: Right;  CDE:15.51  . CATARACT EXTRACTION W/PHACO Left 03/27/2012   Procedure: CATARACT EXTRACTION PHACO AND INTRAOCULAR LENS PLACEMENT (IOC);  Surgeon: Tonny Branch, MD;  Location: [facility];  Service: Ophthalmology;  Laterality: Left;  CDE=10.91  . COLONOSCOPY  12/08/2011   Procedure: COLONOSCOPY;  Surgeon: Daneil Dolin,  MD;  Location[facility];  Service: Endoscopy;  Laterality: N/A;  11:15 AM  . CYST EXCISION Right July 2015   Right Preauricular area  . PR VEIN BYPASS GRAFT,AORTO-FEM-POP    . right ankle otif    . right femoral to above the knee popliteal bypass    . right femur fracture    . right inguinal herniorraphy      Family History: Family History  Problem Relation Age of Onset  . Hypertension Mother   . Heart attack Mother   . Colon cancer Neg Hx     Social History: Social History   Tobacco Use  Smoking Status Current Every Day Smoker  . Packs/day: 1.00  . Years: 52.00  . Pack years: 52.00  . Types: Cigarettes  Smokeless Tobacco Never Used  Tobacco Comment   pt states she smokes about 3-4 cigarettes a day   Social History   Substance and Sexual Activity  Alcohol Use No   Comment: she quit 1 year ago   Social History   Substance and Sexual Activity  Drug Use No    Allergies: Allergies  Allergen Reactions  . Aspirin     REACTION: stomach upset    Medications: Current Outpatient Medications  Medication Sig Dispense Refill  . amLODipine (NORVASC) 10 MG tablet Take 10 mg by mouth daily.    . cilostazol (PLETAL) 100 MG tablet Take 100 mg by mouth 2 (two) times daily.     Marland Kitchen gabapentin (NEURONTIN) 100 MG capsule Take 100 mg by mouth at bedtime.     Marland Kitchen losartan (COZAAR) 50 MG tablet Take 50 mg by mouth daily.     . mirtazapine (REMERON) 15 MG tablet Take 15 mg by mouth at bedtime.      Marland Kitchen oxybutynin (DITROPAN-XL) 10 MG 24 hr tablet Take 10 mg by mouth at bedtime.    . risedronate (ACTONEL) 35 MG tablet Take 35 mg by mouth every 7 (seven) days. with water on empty stomach, nothing by mouth or lie down for next 30 minutes.    . simvastatin (ZOCOR) 20 MG tablet Take 20 mg by mouth at bedtime.      . traMADol (ULTRAM) 50 MG tablet Take 50 mg by mouth 2 (two) times daily as needed.    Marland Kitchen acetaminophen (TYLENOL) 500 MG tablet Take 1 tablet (500 mg total) by mouth every 6  (six) hours as needed. (Patient not taking: Reported on 08/09/2019) 30 tablet 0  . calcium-vitamin D (OSCAL WITH D) 500-200 MG-UNIT per tablet Take 1 tablet by mouth daily.   (Patient not taking: Reported on 07/26/2019)    . Casanthranol-Docusate Sodium (STOOL SOFTENER/LAXATIVE PO) Take 100 each by mouth 1 day or 1 dose.   (Patient not taking: Reported on 07/26/2019)    . Difluprednate (DUREZOL) 0.05 % EMUL Apply 1 drop to eye 3 (three) times daily. (Patient not taking: Reported on 07/26/2019)    . fesoterodine (TOVIAZ) 4 MG TB24 Take 4 mg by mouth daily. (Patient not taking: Reported on 08/09/2019)    . fluticasone (VERAMYST) 27.5 MCG/SPRAY nasal spray Place 2 sprays into  the nose daily. (Patient not taking: Reported on 07/26/2019)    . Multiple Vitamin (MULTIVITAMIN) tablet Take 1 tablet by mouth daily. (Patient not taking: Reported on 07/26/2019)    . Nepafenac (ILEVRO) 0.3 % SUSP Apply 1 drop to eye daily. (Patient not taking: Reported on 07/26/2019)    . polyvinyl alcohol-povidone (HYPOTEARS) 1.4-0.6 % ophthalmic solution Place 1-2 drops into both eyes as needed. Dry eyes. (Patient not taking: Reported on 07/26/2019)    . traMADol-acetaminophen (ULTRACET) 37.5-325 MG per tablet Take 1 tablet by mouth 2 (two) times daily.  (Patient not taking: Reported on 08/09/2019)    . zolpidem (AMBIEN) 5 MG tablet Take 1 tablet by mouth at bedtime. (Patient not taking: Reported on 08/09/2019)     No current facility-administered medications for this visit.   Facility-Administered Medications Ordered in Other Visits  Medication Dose Route Frequency Provider Last Rate Last Admin  . fentaNYL (SUBLIMAZE) injection 25-50 mcg  25-50 mcg Intravenous Q5 min PRN Lerry Liner, MD        Review of Systems: GENERAL: negative for malaise, night sweats HEENT: No changes in hearing or vision, no nose bleeds or other nasal problems. NECK: Negative for lumps, goiter, pain and significant neck swelling RESPIRATORY: Negative for cough,  wheezing CARDIOVASCULAR: Negative for chest pain, leg swelling, palpitations, orthopnea GI: SEE HPI MUSCULOSKELETAL: Negative for joint pain or swelling, back pain, and muscle pain. SKIN: Negative for lesions, rash PSYCH: Negative for sleep disturbance, mood disorder and recent psychosocial stressors. HEMATOLOGY Negative for prolonged bleeding, bruising easily, and swollen nodes. ENDOCRINE: Negative for cold or heat intolerance, polyuria, polydipsia and goiter. NEURO: negative for tremor, gait imbalance, syncope and seizures. The remainder of the review of systems is noncontributory.   Physical Exam: BP (!) 201/130 (BP Location: Right Arm, Patient Position: Sitting, Cuff Size: Small)   Pulse (!) 116   Temp 100.1 F (37.8 C) (Oral)   Ht 5\' 1"  (1.549 m)   Wt (!) 80 lb 14.4 oz (36.7 kg)   BMI 15.29 kg/m  GENERAL: The patient is AO x3, in no acute distress.  Looks cachectic. HEENT: Head is normocephalic and atraumatic. EOMI are intact. Mouth is hydrated and without lesions.  Patient has bilateral temporal wasting.  It appears as if her eyelids are swollen. NECK: Supple. No masses CHEST: The patient has presence of 3 cm firm and hard lymph node in her left axilla area which is mildly tender to palpation. LUNGS: Clear to auscultation. No presence of rhonchi/wheezing/rales. Adequate chest expansion HEART: Tachycardic but rhythmic, normal s1 and s2. ABDOMEN: Soft, nontender, no guarding, no peritoneal signs, and nondistended. BS +. No masses. EXTREMITIES: Without any cyanosis, clubbing, rash, lesions or edema. NEUROLOGIC: AOx3, no focal motor deficit. SKIN: no jaundice, no rashes   Imaging/Labs: as above  I personally reviewed and interpreted the available labs, imaging and endoscopic files.  Impression and Plan: Alicia [name] is a 79 y.o. female with past medical history of COPD, hyperlipidemia, hypertension, migraines, peripheral vascular disease, who presents for evaluation of  unintentional weight loss.  The patient has had significant weight loss which has been unintentional, along with significant early satiety.  I'm deeply concerned about a possible malignancy leading to her symptoms given her history of smoking, malnutrition, physical exam findings such as lymphadenopathy in the axillary area and temporal wasting, and undulating fever.  I consider she needs a comprehensive evaluation with a CT chest and CT abdomen pelvis with IV contrast for evaluation of an underlying malignancy,  as well as an EGD and a colonoscopy.  Also, will obtain CBC, CMP and electrolytes before performing these procedures.  The patient was encouraged to increase her intake of protein shakes to improve her sarcopenia.  She understood and agreed.  -Schedule EGD and colonoscopy -Schedule CT chest/abdomen/pelvis with IV contrast -Start taking Ensure three times a day -Check CBC, CMP and electrolytes -RTC 4-6 weeks  All questions were answered.      Harvel Quale, MD Gastroenterology and Hepatology [facility]

## 2019-08-09 NOTE — Patient Instructions (Signed)
Schedule EGD and colonoscopy Schedule CT chest/abdomen/pelvis with IV contrast Start taking Ensure three times a day Perform blood workup RTC 4-6 weeks

## 2019-08-10 LAB — COMPREHENSIVE METABOLIC PANEL
AG Ratio: 1.1 (calc) (ref 1.0–2.5)
ALT: 3 U/L — ABNORMAL LOW (ref 6–29)
AST: 8 U/L — ABNORMAL LOW (ref 10–35)
Albumin: 4 g/dL (ref 3.6–5.1)
Alkaline phosphatase (APISO): 92 U/L (ref 37–153)
BUN: 14 mg/dL (ref 7–25)
CO2: 26 mmol/L (ref 20–32)
Calcium: 10 mg/dL (ref 8.6–10.4)
Chloride: 99 mmol/L (ref 98–110)
Creat: 0.9 mg/dL (ref 0.60–0.93)
Globulin: 3.8 g/dL (calc) — ABNORMAL HIGH (ref 1.9–3.7)
Glucose, Bld: 118 mg/dL — ABNORMAL HIGH (ref 65–99)
Potassium: 4.6 mmol/L (ref 3.5–5.3)
Sodium: 137 mmol/L (ref 135–146)
Total Bilirubin: 0.5 mg/dL (ref 0.2–1.2)
Total Protein: 7.8 g/dL (ref 6.1–8.1)

## 2019-08-10 LAB — CBC
HCT: 40.7 % (ref 35.0–45.0)
Hemoglobin: 13.1 g/dL (ref 11.7–15.5)
MCH: 26.5 pg — ABNORMAL LOW (ref 27.0–33.0)
MCHC: 32.2 g/dL (ref 32.0–36.0)
MCV: 82.4 fL (ref 80.0–100.0)
MPV: 11.7 fL (ref 7.5–12.5)
Platelets: 435 10*3/uL — ABNORMAL HIGH (ref 140–400)
RBC: 4.94 10*6/uL (ref 3.80–5.10)
RDW: 14.2 % (ref 11.0–15.0)
WBC: 11.2 10*3/uL — ABNORMAL HIGH (ref 3.8–10.8)

## 2019-08-10 LAB — PHOSPHORUS: Phosphorus: 4.3 mg/dL (ref 2.1–4.3)

## 2019-08-10 LAB — MAGNESIUM: Magnesium: 1.7 mg/dL (ref 1.5–2.5)

## 2019-08-16 ENCOUNTER — Ambulatory Visit (INDEPENDENT_AMBULATORY_CARE_PROVIDER_SITE_OTHER): Payer: Medicare HMO | Admitting: Gastroenterology

## 2019-08-16 ENCOUNTER — Other Ambulatory Visit (INDEPENDENT_AMBULATORY_CARE_PROVIDER_SITE_OTHER): Payer: Self-pay | Admitting: Gastroenterology

## 2019-08-16 ENCOUNTER — Encounter (INDEPENDENT_AMBULATORY_CARE_PROVIDER_SITE_OTHER): Payer: Self-pay

## 2019-08-16 DIAGNOSIS — J449 Chronic obstructive pulmonary disease, unspecified: Secondary | ICD-10-CM | POA: Diagnosis not present

## 2019-08-16 DIAGNOSIS — I739 Peripheral vascular disease, unspecified: Secondary | ICD-10-CM | POA: Diagnosis not present

## 2019-08-16 DIAGNOSIS — R634 Abnormal weight loss: Secondary | ICD-10-CM

## 2019-08-16 DIAGNOSIS — E43 Unspecified severe protein-calorie malnutrition: Secondary | ICD-10-CM | POA: Diagnosis not present

## 2019-08-16 DIAGNOSIS — R6881 Early satiety: Secondary | ICD-10-CM

## 2019-08-16 DIAGNOSIS — I10 Essential (primary) hypertension: Secondary | ICD-10-CM | POA: Diagnosis not present

## 2019-08-16 NOTE — Progress Notes (Unsigned)
Orders placed for Dr Cherlynn June  CT chest and CT a/p

## 2019-08-28 NOTE — Patient Instructions (Signed)
Alicia [name]  08/28/2019     @PREFPERIOPPHARMACY @   Your procedure is scheduled on  08/31/2019.  Report to [facility] at  [phone]  A.M.  Call this number if you have problems the morning of surgery:  [phone]   Remember:  Follow the diet and prep instructions from the office.                       Take these medicines the morning of surgery with A SIP OF WATER  Amlodipine, gabapentin, tramadol(if needed).    Do not wear jewelry, make-up or nail polish.  Do not wear lotions, powders, or perfumes. Please wear deodorant and brush your teeth.  Do not shave 48 hours prior to surgery.  Men may shave face and neck.  Do not bring valuables to the hospital.  [facility] is not responsible for any belongings or valuables.  Contacts, dentures or bridgework may not be worn into surgery.  Leave your suitcase in the car.  After surgery it may be brought to your room.  For patients admitted to the hospital, discharge time will be determined by your treatment team.  Patients discharged the day of surgery will not be allowed to drive home.   Name and phone number of your driver:   family Special instructions:  DO NOT smoke the morning of our procedure.  Please read over the following fact sheets that you were given. Anesthesia Post-op Instructions and Care and Recovery After Surgery       Upper Endoscopy, Adult, Care After This sheet gives you information about how to care for yourself after your procedure. Your health care provider may also give you more specific instructions. If you have problems or questions, contact your health care provider. What can I expect after the procedure? After the procedure, it is common to have:  A sore throat.  Mild stomach pain or discomfort.  Bloating.  Nausea. Follow these instructions at home:   Follow instructions from your health care provider about what to eat or drink after your procedure.  Return to your normal activities  as told by your health care provider. Ask your health care provider what activities are safe for you.  Take over-the-counter and prescription medicines only as told by your health care provider.  Do not drive for 24 hours if you were given a sedative during your procedure.  Keep all follow-up visits as told by your health care provider. This is important. Contact a health care provider if you have:  A sore throat that lasts longer than one day.  Trouble swallowing. Get help right away if:  You vomit blood or your vomit looks like coffee grounds.  You have: ? A fever. ? Bloody, black, or tarry stools. ? A severe sore throat or you cannot swallow. ? Difficulty breathing. ? Severe pain in your chest or abdomen. Summary  After the procedure, it is common to have a sore throat, mild stomach discomfort, bloating, and nausea.  Do not drive for 24 hours if you were given a sedative during the procedure.  Follow instructions from your health care provider about what to eat or drink after your procedure.  Return to your normal activities as told by your health care provider. This information is not intended to replace advice given to you by your health care provider. Make sure you discuss any questions you have with your health care provider. Document Revised:  06/28/2017 Document Reviewed: 06/06/2017 Elsevier Patient Education  [place].  Colonoscopy, Adult, Care After This sheet gives you information about how to care for yourself after your procedure. Your health care provider may also give you more specific instructions. If you have problems or questions, contact your health care provider. What can I expect after the procedure? After the procedure, it is common to have:  A small amount of blood in your stool for 24 hours after the procedure.  Some gas.  Mild cramping or bloating of your abdomen. Follow these instructions at home: Eating and drinking   Drink enough  fluid to keep your urine pale yellow.  Follow instructions from your health care provider about eating or drinking restrictions.  Resume your normal diet as instructed by your health care provider. Avoid heavy or fried foods that are hard to digest. Activity  Rest as told by your health care provider.  Avoid sitting for a long time without moving. Get up to take short walks every 1-2 hours. This is important to improve blood flow and breathing. Ask for help if you feel weak or unsteady.  Return to your normal activities as told by your health care provider. Ask your health care provider what activities are safe for you. Managing cramping and bloating   Try walking around when you have cramps or feel bloated.  Apply heat to your abdomen as told by your health care provider. Use the heat source that your health care provider recommends, such as a moist heat pack or a heating pad. ? Place a towel between your skin and the heat source. ? Leave the heat on for 20-30 minutes. ? Remove the heat if your skin turns bright red. This is especially important if you are unable to feel pain, heat, or cold. You may have a greater risk of getting burned. General instructions  For the first 24 hours after the procedure: ? Do not drive or use machinery. ? Do not sign important documents. ? Do not drink alcohol. ? Do your regular daily activities at a slower pace than normal. ? Eat soft foods that are easy to digest.  Take over-the-counter and prescription medicines only as told by your health care provider.  Keep all follow-up visits as told by your health care provider. This is important. Contact a health care provider if:  You have blood in your stool 2-3 days after the procedure. Get help right away if you have:  More than a small spotting of blood in your stool.  Large blood clots in your stool.  Swelling of your abdomen.  Nausea or vomiting.  A fever.  Increasing pain in your  abdomen that is not relieved with medicine. Summary  After the procedure, it is common to have a small amount of blood in your stool. You may also have mild cramping and bloating of your abdomen.  For the first 24 hours after the procedure, do not drive or use machinery, sign important documents, or drink alcohol.  Get help right away if you have a lot of blood in your stool, nausea or vomiting, a fever, or increased pain in your abdomen. This information is not intended to replace advice given to you by your health care provider. Make sure you discuss any questions you have with your health care provider. Document Revised: 07/31/2018 Document Reviewed: 07/31/2018 Elsevier Patient Education  [place] After These instructions provide you with information about caring for yourself after  your procedure. Your health care provider may also give you more specific instructions. Your treatment has been planned according to current medical practices, but problems sometimes occur. Call your health care provider if you have any problems or questions after your procedure. What can I expect after the procedure? After your procedure, you may:  Feel sleepy for several hours.  Feel clumsy and have poor balance for several hours.  Feel forgetful about what happened after the procedure.  Have poor judgment for several hours.  Feel nauseous or vomit.  Have a sore throat if you had a breathing tube during the procedure. Follow these instructions at home: For at least 24 hours after the procedure:      Have a responsible adult stay with you. It is important to have someone help care for you until you are awake and alert.  Rest as needed.  Do not: ? Participate in activities in which you could fall or become injured. ? Drive. ? Use heavy machinery. ? Drink alcohol. ? Take sleeping pills or medicines that cause drowsiness. ? Make important decisions or  sign legal documents. ? Take care of children on your own. Eating and drinking  Follow the diet that is recommended by your health care provider.  If you vomit, drink water, juice, or soup when you can drink without vomiting.  Make sure you have little or no nausea before eating solid foods. General instructions  Take over-the-counter and prescription medicines only as told by your health care provider.  If you have sleep apnea, surgery and certain medicines can increase your risk for breathing problems. Follow instructions from your health care provider about wearing your sleep device: ? Anytime you are sleeping, including during daytime naps. ? While taking prescription pain medicines, sleeping medicines, or medicines that make you drowsy.  If you smoke, do not smoke without supervision.  Keep all follow-up visits as told by your health care provider. This is important. Contact a health care provider if:  You keep feeling nauseous or you keep vomiting.  You feel light-headed.  You develop a rash.  You have a fever. Get help right away if:  You have trouble breathing. Summary  For several hours after your procedure, you may feel sleepy and have poor judgment.  Have a responsible adult stay with you for at least 24 hours or until you are awake and alert. This information is not intended to replace advice given to you by your health care provider. Make sure you discuss any questions you have with your health care provider. Document Revised: 04/04/2017 Document Reviewed: 04/27/2015 Elsevier Patient Education  [place].

## 2019-08-29 ENCOUNTER — Other Ambulatory Visit (HOSPITAL_COMMUNITY)
Admission: RE | Admit: 2019-08-29 | Discharge: 2019-08-29 | Disposition: A | Discharge status: Home / Self Care | Payer: Medicare HMO | Source: Ambulatory Visit | Attending: Gastroenterology | Admitting: Gastroenterology

## 2019-08-29 ENCOUNTER — Encounter (HOSPITAL_COMMUNITY)
Admission: RE | Admit: 2019-08-29 | Discharge: 2019-08-29 | Disposition: A | Discharge status: Home / Self Care | Payer: Medicare HMO | Source: Ambulatory Visit | Attending: Gastroenterology | Admitting: Gastroenterology

## 2019-08-29 ENCOUNTER — Encounter (HOSPITAL_COMMUNITY): Payer: Self-pay

## 2019-08-29 ENCOUNTER — Other Ambulatory Visit: Payer: Self-pay

## 2019-08-29 DIAGNOSIS — Z01818 Encounter for other preprocedural examination: Secondary | ICD-10-CM | POA: Insufficient documentation

## 2019-08-29 DIAGNOSIS — Z20822 Contact with and (suspected) exposure to covid-19: Secondary | ICD-10-CM | POA: Diagnosis not present

## 2019-08-30 LAB — SARS CORONAVIRUS 2 (TAT 6-24 HRS): SARS Coronavirus 2: NEGATIVE

## 2019-08-31 ENCOUNTER — Ambulatory Visit (HOSPITAL_COMMUNITY)
Admission: RE | Admit: 2019-08-31 | Discharge: 2019-08-31 | Disposition: A | Discharge status: Home / Self Care | Payer: Medicare HMO | Attending: Gastroenterology | Admitting: Gastroenterology

## 2019-08-31 ENCOUNTER — Other Ambulatory Visit: Payer: Self-pay

## 2019-08-31 ENCOUNTER — Encounter (HOSPITAL_COMMUNITY)
Admission: RE | Disposition: A | Discharge status: Home / Self Care | Payer: Self-pay | Source: Home / Self Care | Attending: Gastroenterology

## 2019-08-31 ENCOUNTER — Encounter (HOSPITAL_COMMUNITY): Payer: Self-pay | Admitting: Gastroenterology

## 2019-08-31 ENCOUNTER — Ambulatory Visit (HOSPITAL_COMMUNITY): Payer: Medicare HMO | Admitting: Anesthesiology

## 2019-08-31 DIAGNOSIS — F17210 Nicotine dependence, cigarettes, uncomplicated: Secondary | ICD-10-CM | POA: Insufficient documentation

## 2019-08-31 DIAGNOSIS — Z8249 Family history of ischemic heart disease and other diseases of the circulatory system: Secondary | ICD-10-CM | POA: Diagnosis not present

## 2019-08-31 DIAGNOSIS — D125 Benign neoplasm of sigmoid colon: Secondary | ICD-10-CM | POA: Diagnosis not present

## 2019-08-31 DIAGNOSIS — K5520 Angiodysplasia of colon without hemorrhage: Secondary | ICD-10-CM | POA: Diagnosis not present

## 2019-08-31 DIAGNOSIS — M654 Radial styloid tenosynovitis [de Quervain]: Secondary | ICD-10-CM | POA: Insufficient documentation

## 2019-08-31 DIAGNOSIS — I10 Essential (primary) hypertension: Secondary | ICD-10-CM | POA: Diagnosis not present

## 2019-08-31 DIAGNOSIS — G43909 Migraine, unspecified, not intractable, without status migrainosus: Secondary | ICD-10-CM | POA: Diagnosis not present

## 2019-08-31 DIAGNOSIS — R6881 Early satiety: Secondary | ICD-10-CM | POA: Diagnosis not present

## 2019-08-31 DIAGNOSIS — K589 Irritable bowel syndrome without diarrhea: Secondary | ICD-10-CM | POA: Diagnosis not present

## 2019-08-31 DIAGNOSIS — F329 Major depressive disorder, single episode, unspecified: Secondary | ICD-10-CM | POA: Diagnosis not present

## 2019-08-31 DIAGNOSIS — K449 Diaphragmatic hernia without obstruction or gangrene: Secondary | ICD-10-CM | POA: Insufficient documentation

## 2019-08-31 DIAGNOSIS — K623 Rectal prolapse: Secondary | ICD-10-CM | POA: Insufficient documentation

## 2019-08-31 DIAGNOSIS — R69 Illness, unspecified: Secondary | ICD-10-CM | POA: Diagnosis not present

## 2019-08-31 DIAGNOSIS — I739 Peripheral vascular disease, unspecified: Secondary | ICD-10-CM | POA: Diagnosis not present

## 2019-08-31 DIAGNOSIS — E785 Hyperlipidemia, unspecified: Secondary | ICD-10-CM | POA: Insufficient documentation

## 2019-08-31 DIAGNOSIS — M1990 Unspecified osteoarthritis, unspecified site: Secondary | ICD-10-CM | POA: Diagnosis not present

## 2019-08-31 DIAGNOSIS — J449 Chronic obstructive pulmonary disease, unspecified: Secondary | ICD-10-CM | POA: Diagnosis not present

## 2019-08-31 DIAGNOSIS — K5730 Diverticulosis of large intestine without perforation or abscess without bleeding: Secondary | ICD-10-CM | POA: Diagnosis not present

## 2019-08-31 DIAGNOSIS — R634 Abnormal weight loss: Secondary | ICD-10-CM | POA: Insufficient documentation

## 2019-08-31 DIAGNOSIS — Z20822 Contact with and (suspected) exposure to covid-19: Secondary | ICD-10-CM | POA: Diagnosis not present

## 2019-08-31 DIAGNOSIS — F419 Anxiety disorder, unspecified: Secondary | ICD-10-CM | POA: Insufficient documentation

## 2019-08-31 DIAGNOSIS — K635 Polyp of colon: Secondary | ICD-10-CM | POA: Diagnosis not present

## 2019-08-31 DIAGNOSIS — Z79899 Other long term (current) drug therapy: Secondary | ICD-10-CM | POA: Insufficient documentation

## 2019-08-31 HISTORY — PX: ESOPHAGOGASTRODUODENOSCOPY (EGD) WITH PROPOFOL: SHX5813

## 2019-08-31 HISTORY — PX: BIOPSY: SHX5522

## 2019-08-31 HISTORY — PX: COLONOSCOPY WITH PROPOFOL: SHX5780

## 2019-08-31 SURGERY — COLONOSCOPY WITH PROPOFOL
Anesthesia: General

## 2019-08-31 MED ORDER — PROPOFOL 10 MG/ML IV BOLUS
INTRAVENOUS | Status: DC | PRN
  Administered 2019-08-31 (×2): 30 mg via INTRAVENOUS
  Administered 2019-08-31: 60 mg via INTRAVENOUS
  Administered 2019-08-31: 70 mg via INTRAVENOUS
  Administered 2019-08-31 (×3): 30 mg via INTRAVENOUS
  Administered 2019-08-31: 15 mg via INTRAVENOUS
  Administered 2019-08-31: 30 mg via INTRAVENOUS

## 2019-08-31 MED ORDER — STERILE WATER FOR IRRIGATION IR SOLN
Status: DC | PRN
  Administered 2019-08-31: 1.5 mL

## 2019-08-31 MED ORDER — PROPOFOL 500 MG/50ML IV EMUL
INTRAVENOUS | Status: DC | PRN
  Administered 2019-08-31: 50 ug/kg/min via INTRAVENOUS

## 2019-08-31 MED ORDER — LIDOCAINE VISCOUS HCL 2 % MT SOLN
15.0000 mL | Freq: Once | OROMUCOSAL | Status: AC
  Administered 2019-08-31: 15 mL via OROMUCOSAL

## 2019-08-31 MED ORDER — LACTATED RINGERS IV SOLN: INTRAVENOUS | Status: DC

## 2019-08-31 MED ORDER — LIDOCAINE VISCOUS HCL 2 % MT SOLN
OROMUCOSAL | Status: AC
  Filled 2019-08-31: qty 15

## 2019-08-31 NOTE — Anesthesia Postprocedure Evaluation (Signed)
Anesthesia Post Note  Patient: Shevonne P Erhard  Procedure(s) Performed: COLONOSCOPY WITH PROPOFOL (N/A ) ESOPHAGOGASTRODUODENOSCOPY (EGD) WITH PROPOFOL (N/A ) BIOPSY  Patient location during evaluation: PACU Anesthesia Type: General Level of consciousness: awake and alert and patient cooperative Pain management: satisfactory to patient Vital Signs Assessment: post-procedure vital signs reviewed and stable Respiratory status: spontaneous breathing Cardiovascular status: stable Postop Assessment: no apparent nausea or vomiting Anesthetic complications: no Comments: Infiltrated IV explained to patient. Warm compresses on right upper arm. Patient states understanding.   No complications documented.   Last Vitals:  Vitals:   08/31/19 0925 08/31/19 0930  BP: (!) 151/95 (!) 163/114  Pulse: 97 96  Resp: 20 (!) 23  Temp: 36.8 C   SpO2: 99% 100%    Last Pain:  Vitals:   08/31/19 0925  TempSrc:   PainSc: 0-No pain                 Tito Ausmus

## 2019-08-31 NOTE — H&P (Signed)
Alicia [name] is an 79 y.o. female.   Chief Complaint: Unintentional weight loss, vomiting and abdominal pain HPI: Briefly, this is a 79 year old female with past medical history of COPD, hyperlipidemia, hypertension, migraines, peripheral vascular disease, who comes to the hospital to undergo EGD and colonoscopy for evaluation of unintentional weight loss.  The patient has lost a total of 28 pounds since 2015 per records but she reports that her weight loss has been accentuated recently.  She also has had some mild abdominal cramping for last 3 months, nausea and vomiting episodes.  She has tried to eat as usual but has had weight loss regardless.  The patient has presented episodes of intermittent fever in the past with negative infectious work-up.  Patient never had an EGD.  Her last colonoscopy was performed 2013 which was normal.  The patient is an active smoker since age 78 but stopped couple months ago.  No family history of malignancies.  Past Medical History:  Diagnosis Date  . Allergic rhinitis   . Anxiety   . Cataract   . COPD (chronic obstructive pulmonary disease) ([place])   . Depression   . DeQuervain's disease (tenosynovitis)   . Hyperlipidemia   . Hypertension   . Irritable bowel syndrome with constipation   . Knee fracture   . Low back pain   . Migraines   . Osteoarthritis   . Peptic ulcer disease   . PVD (peripheral vascular disease) ([place])   . Right rotator cuff tear     Past Surgical History:  Procedure Laterality Date  . back tumor    . breast tumor    . CATARACT EXTRACTION W/PHACO Right 03/13/2012   Procedure: CATARACT EXTRACTION PHACO AND INTRAOCULAR LENS PLACEMENT (IOC);  Surgeon: Tonny Branch, MD;  Location: [facility];  Service: Ophthalmology;  Laterality: Right;  CDE:15.51  . CATARACT EXTRACTION W/PHACO Left 03/27/2012   Procedure: CATARACT EXTRACTION PHACO AND INTRAOCULAR LENS PLACEMENT (IOC);  Surgeon: Tonny Branch, MD;  Location: [facility];  Service: Ophthalmology;   Laterality: Left;  CDE=10.91  . COLONOSCOPY  12/08/2011   Procedure: COLONOSCOPY;  Surgeon: Daneil Dolin, MD;  Location: [facility] SUITE;  Service: Endoscopy;  Laterality: N/A;  11:15 AM  . CYST EXCISION Right July 2015   Right Preauricular area  . PR VEIN BYPASS GRAFT,AORTO-FEM-POP    . right ankle otif    . right femoral to above the knee popliteal bypass    . right femur fracture    . right inguinal herniorraphy      Family History  Problem Relation Age of Onset  . Hypertension Mother   . Heart attack Mother   . Colon cancer Neg Hx    Social History:  reports that she has been smoking cigarettes. She has a 52.00 pack-year smoking history. She has never used smokeless tobacco. She reports that she does not drink alcohol and does not use drugs.  Allergies:  Allergies  Allergen Reactions  . Aspirin Other (See Comments)    stomach upset    Medications Prior to Admission  Medication Sig Dispense Refill  . cilostazol (PLETAL) 100 MG tablet Take 100 mg by mouth 2 (two) times daily.     . risedronate (ACTONEL) 35 MG tablet Take 35 mg by mouth every Thursday. with water on empty stomach, nothing by mouth or lie down for next 30 minutes.    . simvastatin (ZOCOR) 20 MG tablet Take 20 mg by mouth at bedtime.      Marland Kitchen  traMADol (ULTRAM) 50 MG tablet Take 50 mg by mouth 2 (two) times daily as needed.    Marland Kitchen acetaminophen (TYLENOL) 500 MG tablet Take 1 tablet (500 mg total) by mouth every 6 (six) hours as needed. (Patient not taking: Reported on 08/09/2019) 30 tablet 0  . amLODipine (NORVASC) 10 MG tablet Take 10 mg by mouth daily.    Marland Kitchen gabapentin (NEURONTIN) 100 MG capsule Take 200 mg by mouth at bedtime.     Marland Kitchen losartan (COZAAR) 50 MG tablet Take 50 mg by mouth daily.     . mirtazapine (REMERON) 15 MG tablet Take 15 mg by mouth at bedtime.      Marland Kitchen oxybutynin (DITROPAN-XL) 10 MG 24 hr tablet Take 10 mg by mouth at bedtime.      Results for orders placed or performed during the hospital  encounter of 08/29/19 (from the past 48 hour(s))  SARS CORONAVIRUS 2 (TAT 6-24 HRS) Nasopharyngeal Nasopharyngeal Swab     Status: None   Collection Time: 08/29/19 11:11 AM   Specimen: Nasopharyngeal Swab  Result Value Ref Range   SARS Coronavirus 2 NEGATIVE NEGATIVE    Comment: (NOTE) SARS-CoV-2 target nucleic acids are NOT DETECTED.  The SARS-CoV-2 RNA is generally detectable in upper and lower respiratory specimens during the acute phase of infection. Negative results do not preclude SARS-CoV-2 infection, do not rule out co-infections with other pathogens, and should not be used as the sole basis for treatment or other patient management decisions. Negative results must be combined with clinical observations, patient history, and epidemiological information. The expected result is Negative.  Fact Sheet for Patients: SugarRoll.be  Fact Sheet for Healthcare Providers: https://www.woods-mathews.com/  This test is not yet approved or cleared by the [place] FDA and  has been authorized for detection and/or diagnosis of SARS-CoV-2 by FDA under an Emergency Use Authorization (EUA). This EUA will remain  in effect (meaning this test can be used) for the duration of the COVID-19 declaration under Se ction 564(b)(1) of the Act, 21 U.S.C. section 360bbb-3(b)(1), unless the authorization is terminated or revoked sooner.  Performed at [facility], [place] [address], [place], [place] [address]    No results found.  Review of Systems  Constitutional: Positive for unexpected weight change.  HENT: Negative.   Eyes: Negative.   Respiratory: Negative.   Cardiovascular: Negative.   Gastrointestinal: Positive for abdominal pain.  Endocrine: Negative.   Genitourinary: Negative.   Musculoskeletal: Negative.   Skin: Negative.   Allergic/Immunologic: Negative.   Neurological: Negative.   Hematological: Positive for adenopathy.   Psychiatric/Behavioral: Negative.     Blood pressure (!) 158/94, pulse (!) 105, temperature 98.2 F (36.8 C), temperature source Oral, resp. rate 19, height 5\' 1"  (1.549 m), weight 36.3 kg, SpO2 100 %. Physical Exam  GENERAL: The patient is AO x3, in no acute distress.  Looks cachectic. HEENT: Head is normocephalic and atraumatic. EOMI are intact. Mouth is hydrated and without lesions.  Patient has bilateral temporal wasting.  It appears as if her eyelids are swollen. NECK: Supple. No masses CHEST: The patient has presence of 3 cm firm and hard lymph node in her left axilla area which is mildly tender to palpation. LUNGS: Clear to auscultation. No presence of rhonchi/wheezing/rales. Adequate chest expansion HEART: Tachycardic but rhythmic, normal s1 and s2. ABDOMEN: Soft, nontender, no guarding, no peritoneal signs, and nondistended. BS +. No masses. EXTREMITIES: Without any cyanosis, clubbing, rash, lesions or edema. NEUROLOGIC: AOx3, no focal motor deficit. SKIN:  no jaundice, no rashes  Assessment/Plan 79 year old female with past medical history of COPD, hyperlipidemia, hypertension, migraines, peripheral vascular disease, coming for evaluation of abnormal weight loss, abdominal pain and vomiting.  We will proceed with an EGD and colonoscopy.  The patient is scheduled to undergo a CT of the chest and abdomen/pelvis as well.  Harvel Quale, MD 08/31/2019, 8:00 AM

## 2019-08-31 NOTE — Op Note (Signed)
[facility] Patient Name: Alicia [name] Procedure Date: 08/31/2019 8:41 AM MRN: [id number] Date of Birth: 01/02/1941 Attending MD: Maylon Peppers ,  CSN: [id number] Age: 79 Admit Type: Outpatient Procedure:                Colonoscopy Indications:              Unintentional weight loss Providers:                Maylon Peppers, Caprice Kluver, Rosina Lowenstein, RN,                            Raphael Gibney, Technician Referring MD:              Medicines:                Monitored Anesthesia Care Complications:            No immediate complications. Estimated Blood Loss:     Estimated blood loss: none. Procedure:                Pre-Anesthesia Assessment:                           - Prior to the procedure, a History and Physical                            was performed, and patient medications, allergies                            and sensitivities were reviewed. The patient's                            tolerance of previous anesthesia was reviewed.                           - The risks and benefits of the procedure and the                            sedation options and risks were discussed with the                            patient. All questions were answered and informed                            consent was obtained.                           - ASA Grade Assessment: III - A patient with severe                            systemic disease.                           After obtaining informed consent, the colonoscope                            was passed under direct vision. Throughout the  procedure, the patient's blood pressure, pulse, and                            oxygen saturations were monitored continuously. The                            [id number]-[id number] ([id number]) scope was introduced through                            the anus and advanced to the the cecum, identified                            by appendiceal orifice and ileocecal valve. The                             colonoscopy was performed without difficulty. The                            patient tolerated the procedure well. Scope                            withdrawal time was 11 minutes. The quality of the                            bowel preparation was excellent. Scope In: 8:45:34 AM Scope Out: 9:12:38 AM Scope Withdrawal Time: 0 hours 11 minutes 13 seconds  Total Procedure Duration: 0 hours 27 minutes 4 seconds  Findings:      The digital exam findings include mild rectal prolapse.      A few small and large-mouthed diverticula were found in the sigmoid       colon, descending colon and ascending colon.      Two small localized angiodysplastic lesions without bleeding were found       in the ascending colon.      A 2 mm polyp was found in the sigmoid colon. The polyp was sessile. The       polyp was removed with a cold biopsy forceps. Resection and retrieval       were complete.      The retroflexed view of the distal rectum and anal verge was normal and       showed no anal or rectal abnormalities. Impression:               - Mild rectal prolapse found on digital exam.                           - Diverticulosis in the sigmoid colon, in the                            descending colon and in the ascending colon.                           - Two non-bleeding colonic angiodysplastic lesions.                           - One 2 mm polyp  in the sigmoid colon, removed with                            a cold biopsy forceps. Resected and retrieved.                           - The distal rectum and anal verge are normal on                            retroflexion view. Moderate Sedation:      Per Anesthesia Care Recommendation:           - Discharge patient to home (ambulatory).                           - Resume previous diet.                           - Await pathology results.                           - Repeat colonoscopy is not recommended due to                            current age  (36 years or older) for screening                            purposes.                           - Perform CT chest/abdomen/pelvis as previously                            ordered. Procedure Code(s):        --- Professional ---                           [phone], GC, Colonoscopy, flexible; with biopsy,                            single or multiple Diagnosis Code(s):        --- Professional ---                           K55.20, Angiodysplasia of colon without hemorrhage                           K63.5, Polyp of colon                           K57.30, Diverticulosis of large intestine without                            perforation or abscess without bleeding CPT copyright 2019 American Medical Association. All rights reserved. The codes documented in this report are preliminary and upon coder review may  be revised to meet current compliance requirements. Maylon Peppers, MD Maylon Peppers,  08/31/2019 9:19:46  AM This report has been signed electronically. Number of Addenda: 0

## 2019-08-31 NOTE — Anesthesia Procedure Notes (Signed)
Date/Time: 08/31/2019 8:11 AM Performed by: Vista Deck, CRNA Pre-anesthesia Checklist: Patient identified, Emergency Drugs available, Suction available, Timeout performed and Patient being monitored Patient Re-evaluated:Patient Re-evaluated prior to induction Oxygen Delivery Method: Nasal Cannula

## 2019-08-31 NOTE — Anesthesia Preprocedure Evaluation (Signed)
Anesthesia Evaluation  Patient identified by MRN, date of birth, ID band Patient awake    Reviewed: Allergy & Precautions, H&P , NPO status , Patient's Chart, lab work & pertinent test results, reviewed documented beta blocker date and time   Airway Mallampati: II  TM Distance: >3 FB Neck ROM: full    Dental no notable dental hx.    Pulmonary COPD,  COPD inhaler, Current Smoker and Patient abstained from smoking.,    Pulmonary exam normal breath sounds clear to auscultation       Cardiovascular Exercise Tolerance: Good hypertension, + Peripheral Vascular Disease   Rhythm:regular Rate:Normal     Neuro/Psych  Headaches, PSYCHIATRIC DISORDERS Anxiety Depression    GI/Hepatic negative GI ROS, Neg liver ROS,   Endo/Other  negative endocrine ROS  Renal/GU negative Renal ROS  negative genitourinary   Musculoskeletal   Abdominal   Peds  Hematology negative hematology ROS (+)   Anesthesia Other Findings   Reproductive/Obstetrics negative OB ROS                             Anesthesia Physical Anesthesia Plan  ASA: III  Anesthesia Plan: General   Post-op Pain Management:    Induction:   PONV Risk Score and Plan: Propofol infusion  Airway Management Planned:   Additional Equipment:   Intra-op Plan:   Post-operative Plan:   Informed Consent: I have reviewed the patients History and Physical, chart, labs and discussed the procedure including the risks, benefits and alternatives for the proposed anesthesia with the patient or authorized representative who has indicated his/her understanding and acceptance.     Dental Advisory Given  Plan Discussed with: CRNA  Anesthesia Plan Comments:         Anesthesia Quick Evaluation

## 2019-08-31 NOTE — Op Note (Signed)
[facility] Patient Name: Alicia [name] Procedure Date: 08/31/2019 8:12 AM MRN: [id number] Date of Birth: 15-Oct-1940 Attending MD: Maylon Peppers ,  CSN: [id number] Age: 79 Admit Type: Outpatient Procedure:                Upper GI endoscopy Indications:              Abnormal weight loss Providers:                Maylon Peppers, Caprice Kluver, Rosina Lowenstein, RN,                            Raphael Gibney, Technician Referring MD:              Medicines:                Monitored Anesthesia Care Complications:            No immediate complications. Estimated Blood Loss:     Estimated blood loss: none. Procedure:                Pre-Anesthesia Assessment:                           - Prior to the procedure, a History and Physical                            was performed, and patient medications, allergies                            and sensitivities were reviewed. The patient's                            tolerance of previous anesthesia was reviewed.                           - The risks and benefits of the procedure and the                            sedation options and risks were discussed with the                            patient. All questions were answered and informed                            consent was obtained.                           After obtaining informed consent, the endoscope was                            passed under direct vision. Throughout the                            procedure, the patient's blood pressure, pulse, and                            oxygen saturations were monitored continuously. The  [id number]-[id number] ([id number]) was introduced through the                            mouth, and advanced to the second part of duodenum.                            The upper GI endoscopy was accomplished without                            difficulty. The patient tolerated the procedure                            well. Scope In: 8:19:35 AM Scope Out:  8:38:24 AM Total Procedure Duration: 0 hours 18 minutes 49 seconds  Findings:      The Z-line was regular and was found 31 cm from the incisors.      A 3 cm hiatal hernia was present.      The entire examined stomach was normal. Imaging was performed using       white light and narrow band imaging to visualize the mucosa.      The examined duodenum was normal. Impression:               - Z-line regular, 31 cm from the incisors.                           - 3 cm hiatal hernia.                           - Normal stomach.                           - Normal examined duodenum.                           - No specimens collected. Moderate Sedation:      Per Anesthesia Care Recommendation:           - Discharge patient to home (ambulatory).                           - Resume previous diet.                           - Proceed with CT chest/abdomen/pelvis Procedure Code(s):        --- Professional ---                           [phone], GC, Esophagogastroduodenoscopy, flexible,                            transoral; diagnostic, including collection of                            specimen(s) by brushing or washing, when performed                            (separate procedure) Diagnosis Code(s):        ---  Professional ---                           K44.9, Diaphragmatic hernia without obstruction or                            gangrene CPT copyright 2019 American Medical Association. All rights reserved. The codes documented in this report are preliminary and upon coder review may  be revised to meet current compliance requirements. Maylon Peppers, MD Maylon Peppers,  08/31/2019 8:44:38 AM This report has been signed electronically. Number of Addenda: 0

## 2019-08-31 NOTE — Discharge Instructions (Signed)
You are being discharged to home.  Resume your previous diet.  We are waiting for your pathology results.  Your physician has indicated that a repeat colonoscopy is not recommended due to your current age (31 years or older) for screening purposes.      Colon Biopsy, Care After This sheet gives you information about how to care for yourself after your procedure. Your health care provider may also give you more specific instructions. If you have problems or questions, contact your health care provider. What can I expect after the procedure? After the procedure, it is common to have:  A small amount of blood in your stool for 24 hours after the procedure.  Some gas.  Mild cramping or bloating in your abdomen. Follow these instructions at home: General instructions  For the first 24 hours after the procedure: ? Do not drive or use machinery. ? Do not sign important documents. ? Do not drink alcohol. ? Do your regular daily activities at a slower pace than normal. ? Eat soft, easy-to-digest foods. ? Rest often.  Take over-the-counter or prescription medicines only as told by your health care provider.  Keep all follow-up visits as told by your health care provider. This is important. Relieving cramping and bloating   Try walking around when you have cramps or feel bloated.  Put heat on your abdomen as told by your health care provider. Use a heat source that your health care provider recommends, such as a moist heat pack or a heating pad. ? Place a towel between your skin and the heat source. ? Leave the heat on for 20-30 minutes. ? Remove the heat if your skin turns bright red. This is especially important if you are unable to feel pain, heat, or cold. You may have a greater risk of getting burned. Eating and drinking  Drink enough fluid to keep your urine pale yellow.  Return to your normal diet as instructed by your health care provider. Avoid heavy or fried foods that are  hard to digest.  Avoid drinking alcohol for as long as told by your health care provider. Contact a health care provider if:  You have blood in your stool 2-3 days after the procedure. Get help right away if:  You have more than a small spotting of blood in your stool.  You pass large blood clots in your stool.  Your abdomen is swollen.  You have nausea or vomiting.  You have a fever.  You have increasing abdominal pain that is not relieved with medicine. Summary  After the procedure, it is common to have mild cramping and bloating in the abdomen.  Do not drive for 24 hours after the procedure.  Try walking around when you have cramps or feel bloated. This information is not intended to replace advice given to you by your health care provider. Make sure you discuss any questions you have with your health care provider. Document Revised: 12/17/2016 Document Reviewed: 06/15/2016 Elsevier Patient Education  [place].    Diverticulosis  Diverticulosis is a condition that develops when small pouches (diverticula) form in the wall of the large intestine (colon). The colon is where water is absorbed and stool (feces) is formed. The pouches form when the inside layer of the colon pushes through weak spots in the outer layers of the colon. You may have a few pouches or many of them. The pouches usually do not cause problems unless they become inflamed or infected. When this happens,  the condition is called diverticulitis. What are the causes? The cause of this condition is not known. What increases the risk? The following factors may make you more likely to develop this condition:  Being older than age 34. Your risk for this condition increases with age. Diverticulosis is rare among people younger than age 61. By age 101, many people have it.  Eating a low-fiber diet.  Having frequent constipation.  Being overweight.  Not getting enough exercise.  Smoking.  Taking  over-the-counter pain medicines, like aspirin and ibuprofen.  Having a family history of diverticulosis. What are the signs or symptoms? In most people, there are no symptoms of this condition. If you do have symptoms, they may include:  Bloating.  Cramps in the abdomen.  Constipation or diarrhea.  Pain in the lower left side of the abdomen. How is this diagnosed? Because diverticulosis usually has no symptoms, it is most often diagnosed during an exam for other colon problems. The condition may be diagnosed by:  Using a flexible scope to examine the colon (colonoscopy).  Taking an X-ray of the colon after dye has been put into the colon (barium enema).  Having a CT scan. How is this treated? You may not need treatment for this condition. Your health care provider may recommend treatment to prevent problems. You may need treatment if you have symptoms or if you previously had diverticulitis. Treatment may include:  Eating a high-fiber diet.  Taking a fiber supplement.  Taking a live bacteria supplement (probiotic).  Taking medicine to relax your colon. Follow these instructions at home: Medicines  Take over-the-counter and prescription medicines only as told by your health care provider.  If told by your health care provider, take a fiber supplement or probiotic. Constipation prevention Your condition may cause constipation. To prevent or treat constipation, you may need to:  Drink enough fluid to keep your urine pale yellow.  Take over-the-counter or prescription medicines.  Eat foods that are high in fiber, such as beans, whole grains, and fresh fruits and vegetables.  Limit foods that are high in fat and processed sugars, such as fried or sweet foods.  General instructions  Try not to strain when you have a bowel movement.  Keep all follow-up visits as told by your health care provider. This is important. Contact a health care provider if you:  Have pain in  your abdomen.  Have bloating.  Have cramps.  Have not had a bowel movement in 3 days. Get help right away if:  Your pain gets worse.  Your bloating becomes very bad.  You have a fever or chills, and your symptoms suddenly get worse.  You vomit.  You have bowel movements that are bloody or black.  You have bleeding from your rectum. Summary  Diverticulosis is a condition that develops when small pouches (diverticula) form in the wall of the large intestine (colon).  You may have a few pouches or many of them.  This condition is most often diagnosed during an exam for other colon problems.  Treatment may include increasing the fiber in your diet, taking supplements, or taking medicines. This information is not intended to replace advice given to you by your health care provider. Make sure you discuss any questions you have with your health care provider. Document Revised: 08/03/2018 Document Reviewed: 08/03/2018 Elsevier Patient Education  [place].  Colon Polyps  Polyps are tissue growths inside the body. Polyps can grow in many places, including the large intestine (  colon). A polyp may be a round bump or a mushroom-shaped growth. You could have one polyp or several. Most colon polyps are noncancerous (benign). However, some colon polyps can become cancerous over time. Finding and removing the polyps early can help prevent this. What are the causes? The exact cause of colon polyps is not known. What increases the risk? You are more likely to develop this condition if you:  Have a family history of colon cancer or colon polyps.  Are older than 36 or older than 45 if you are African American.  Have inflammatory bowel disease, such as ulcerative colitis or Crohn's disease.  Have certain hereditary conditions, such as: ? Familial adenomatous polyposis. ? Lynch syndrome. ? Turcot syndrome. ? Peutz-Jeghers syndrome.  Are overweight.  Smoke cigarettes.  Do  not get enough exercise.  Drink too much alcohol.  Eat a diet that is high in fat and red meat and low in fiber.  Had childhood cancer that was treated with abdominal radiation. What are the signs or symptoms? Most polyps do not cause symptoms. If you have symptoms, they may include:  Blood coming from your rectum when having a bowel movement.  Blood in your stool. The stool may look dark red or black.  Abdominal pain.  A change in bowel habits, such as constipation or diarrhea. How is this diagnosed? This condition is diagnosed with a colonoscopy. This is a procedure in which a lighted, flexible scope is inserted into the anus and then passed into the colon to examine the area. Polyps are sometimes found when a colonoscopy is done as part of routine cancer screening tests. How is this treated? Treatment for this condition involves removing any polyps that are found. Most polyps can be removed during a colonoscopy. Those polyps will then be tested for cancer. Additional treatment may be needed depending on the results of testing. Follow these instructions at home: Lifestyle  Maintain a healthy weight, or lose weight if recommended by your health care provider.  Exercise every day or as told by your health care provider.  Do not use any products that contain nicotine or tobacco, such as cigarettes and e-cigarettes. If you need help quitting, ask your health care provider.  If you drink alcohol, limit how much you have: ? 0-1 drink a day for women. ? 0-2 drinks a day for men.  Be aware of how much alcohol is in your drink. In the [place] one drink equals one 12 oz bottle of beer (355 mL), one 5 oz glass of wine (148 mL), or one 1 oz shot of hard liquor (44 mL). Eating and drinking   Eat foods that are high in fiber, such as fruits, vegetables, and whole grains.  Eat foods that are high in calcium and vitamin D, such as milk, cheese, yogurt, eggs, liver, fish, and  broccoli.  Limit foods that are high in fat, such as fried foods and desserts.  Limit the amount of red meat and processed meat you eat, such as hot dogs, sausage, bacon, and lunch meats. General instructions  Keep all follow-up visits as told by your health care provider. This is important. ? This includes having regularly scheduled colonoscopies. ? Talk to your health care provider about when you need a colonoscopy. Contact a health care provider if:  You have new or worsening bleeding during a bowel movement.  You have new or increased blood in your stool.  You have a change in bowel habits.  You lose  weight for no known reason. Summary  Polyps are tissue growths inside the body. Polyps can grow in many places, including the colon.  Most colon polyps are noncancerous (benign), but some can become cancerous over time.  This condition is diagnosed with a colonoscopy.  Treatment for this condition involves removing any polyps that are found. Most polyps can be removed during a colonoscopy. This information is not intended to replace advice given to you by your health care provider. Make sure you discuss any questions you have with your health care provider. Document Revised: 04/21/2017 Document Reviewed: 04/21/2017 Elsevier Patient Education  [place] After These instructions provide you with information about caring for yourself after your procedure. Your health care provider may also give you more specific instructions. Your treatment has been planned according to current medical practices, but problems sometimes occur. Call your health care provider if you have any problems or questions after your procedure. What can I expect after the procedure? After your procedure, you may:  Feel sleepy for several hours.  Feel clumsy and have poor balance for several hours.  Feel forgetful about what happened after the procedure.  Have  poor judgment for several hours.  Feel nauseous or vomit.  Have a sore throat if you had a breathing tube during the procedure. Follow these instructions at home: For at least 24 hours after the procedure:      Have a responsible adult stay with you. It is important to have someone help care for you until you are awake and alert.  Rest as needed.  Do not: ? Participate in activities in which you could fall or become injured. ? Drive. ? Use heavy machinery. ? Drink alcohol. ? Take sleeping pills or medicines that cause drowsiness. ? Make important decisions or sign legal documents. ? Take care of children on your own. Eating and drinking  Follow the diet that is recommended by your health care provider.  If you vomit, drink water, juice, or soup when you can drink without vomiting.  Make sure you have little or no nausea before eating solid foods. General instructions  Take over-the-counter and prescription medicines only as told by your health care provider.  If you have sleep apnea, surgery and certain medicines can increase your risk for breathing problems. Follow instructions from your health care provider about wearing your sleep device: ? Anytime you are sleeping, including during daytime naps. ? While taking prescription pain medicines, sleeping medicines, or medicines that make you drowsy.  If you smoke, do not smoke without supervision.  Keep all follow-up visits as told by your health care provider. This is important. Contact a health care provider if:  You keep feeling nauseous or you keep vomiting.  You feel light-headed.  You develop a rash.  You have a fever. Get help right away if:  You have trouble breathing. Summary  For several hours after your procedure, you may feel sleepy and have poor judgment.  Have a responsible adult stay with you for at least 24 hours or until you are awake and alert. This information is not intended to replace advice  given to you by your health care provider. Make sure you discuss any questions you have with your health care provider. Document Revised: 04/04/2017 Document Reviewed: 04/27/2015 Elsevier Patient Education  [place].

## 2019-08-31 NOTE — Transfer of Care (Signed)
Immediate Anesthesia Transfer of Care Note  Patient: Alicia [name]  Procedure(s) Performed: COLONOSCOPY WITH PROPOFOL (N/A ) ESOPHAGOGASTRODUODENOSCOPY (EGD) WITH PROPOFOL (N/A ) BIOPSY  Patient Location: PACU  Anesthesia Type:General  Level of Consciousness: awake, alert  and patient cooperative  Airway & Oxygen Therapy: Patient Spontanous Breathing  Post-op Assessment: Report given to RN and Post -op Vital signs reviewed and stable  Post vital signs: Reviewed and stable98.  Last Vitals:  Vitals Value Taken Time  BP 151/95 08/31/19 0921  Temp 98.2   Pulse 95 08/31/19 0922  Resp 23 08/31/19 0922  SpO2 100 % 08/31/19 0922  Vitals shown include unvalidated device data.  Last Pain:  Vitals:   08/31/19 0814  TempSrc:   PainSc: 3          Complications: No complications documented.

## 2019-09-03 LAB — SURGICAL PATHOLOGY

## 2019-09-04 ENCOUNTER — Ambulatory Visit (HOSPITAL_COMMUNITY)
Admission: RE | Admit: 2019-09-04 | Discharge: 2019-09-04 | Disposition: A | Discharge status: Home / Self Care | Payer: Medicare HMO | Source: Ambulatory Visit | Attending: Gastroenterology | Admitting: Gastroenterology

## 2019-09-04 ENCOUNTER — Encounter (HOSPITAL_COMMUNITY): Payer: Self-pay | Admitting: Gastroenterology

## 2019-09-04 ENCOUNTER — Other Ambulatory Visit: Payer: Self-pay

## 2019-09-04 DIAGNOSIS — N2889 Other specified disorders of kidney and ureter: Secondary | ICD-10-CM | POA: Insufficient documentation

## 2019-09-04 DIAGNOSIS — N281 Cyst of kidney, acquired: Secondary | ICD-10-CM | POA: Diagnosis not present

## 2019-09-04 DIAGNOSIS — I2510 Atherosclerotic heart disease of native coronary artery without angina pectoris: Secondary | ICD-10-CM | POA: Diagnosis not present

## 2019-09-04 DIAGNOSIS — K449 Diaphragmatic hernia without obstruction or gangrene: Secondary | ICD-10-CM | POA: Insufficient documentation

## 2019-09-04 DIAGNOSIS — R599 Enlarged lymph nodes, unspecified: Secondary | ICD-10-CM | POA: Diagnosis not present

## 2019-09-04 DIAGNOSIS — R609 Edema, unspecified: Secondary | ICD-10-CM | POA: Diagnosis not present

## 2019-09-04 DIAGNOSIS — R6881 Early satiety: Secondary | ICD-10-CM | POA: Diagnosis not present

## 2019-09-04 DIAGNOSIS — S2243XA Multiple fractures of ribs, bilateral, initial encounter for closed fracture: Secondary | ICD-10-CM | POA: Diagnosis not present

## 2019-09-04 DIAGNOSIS — R918 Other nonspecific abnormal finding of lung field: Secondary | ICD-10-CM | POA: Insufficient documentation

## 2019-09-04 DIAGNOSIS — I723 Aneurysm of iliac artery: Secondary | ICD-10-CM | POA: Diagnosis not present

## 2019-09-04 DIAGNOSIS — R634 Abnormal weight loss: Secondary | ICD-10-CM | POA: Diagnosis not present

## 2019-09-04 DIAGNOSIS — S22080A Wedge compression fracture of T11-T12 vertebra, initial encounter for closed fracture: Secondary | ICD-10-CM | POA: Diagnosis not present

## 2019-09-04 DIAGNOSIS — D171 Benign lipomatous neoplasm of skin and subcutaneous tissue of trunk: Secondary | ICD-10-CM | POA: Diagnosis not present

## 2019-09-04 MED ORDER — IOHEXOL 9 MG/ML PO SOLN: 500.0000 mL | ORAL | Status: AC

## 2019-09-04 MED ORDER — IOHEXOL 300 MG/ML  SOLN
75.0000 mL | Freq: Once | INTRAMUSCULAR | Status: AC | PRN
  Administered 2019-09-04: 75 mL via INTRAVENOUS

## 2019-09-05 ENCOUNTER — Telehealth (INDEPENDENT_AMBULATORY_CARE_PROVIDER_SITE_OTHER): Payer: Self-pay | Admitting: Gastroenterology

## 2019-09-05 NOTE — Telephone Encounter (Signed)
I tried calling the patient discuss results of CT of the chest which were concerning for possible lymphoma versus renal cell carcinoma with multiple metastases, which could be causing her weight loss.  She did not pick up the phone, her voicemail was completely full.  She will need to follow in the clinic. Will attempt to reach her to schedule appointment to discuss next steps in her care.  She will need to be seen by an oncologist which will be discussed in the next appointment.  Maylon Peppers, MD Gastroenterology and Hepatology [facility]

## 2019-09-05 NOTE — Progress Notes (Signed)
I reviewed the pathology results. I called the patient TO  inform her about the results but she did not pick up the phone. Voicemail was full. Pathology showed one 2 mm tubular adenoma, no need to repeat colonoscopy.  Thanks,  Maylon Peppers, MD Gastroenterology and Hepatology [facility]

## 2019-09-10 ENCOUNTER — Other Ambulatory Visit (HOSPITAL_COMMUNITY)
Admission: RE | Admit: 2019-09-10 | Discharge: 2019-09-10 | Disposition: A | Discharge status: Home / Self Care | Payer: Medicare HMO | Source: Ambulatory Visit | Attending: Gastroenterology | Admitting: Gastroenterology

## 2019-09-10 ENCOUNTER — Encounter (INDEPENDENT_AMBULATORY_CARE_PROVIDER_SITE_OTHER): Payer: Self-pay | Admitting: Gastroenterology

## 2019-09-10 ENCOUNTER — Ambulatory Visit (INDEPENDENT_AMBULATORY_CARE_PROVIDER_SITE_OTHER): Payer: Medicare HMO | Admitting: Gastroenterology

## 2019-09-10 ENCOUNTER — Other Ambulatory Visit: Payer: Self-pay

## 2019-09-10 DIAGNOSIS — N2889 Other specified disorders of kidney and ureter: Secondary | ICD-10-CM | POA: Diagnosis not present

## 2019-09-10 DIAGNOSIS — R918 Other nonspecific abnormal finding of lung field: Secondary | ICD-10-CM | POA: Diagnosis not present

## 2019-09-10 DIAGNOSIS — R911 Solitary pulmonary nodule: Secondary | ICD-10-CM | POA: Diagnosis not present

## 2019-09-10 NOTE — Progress Notes (Signed)
Alicia [name], M.D. Gastroenterology & Hepatology [facility] [address], [place] [address]  Primary Care Physician: Rosita Fire, [place] [address]  I will communicate my assessment and recommendations to the referring MD via EMR. "Note: Occasional unusual wording and randomly placed punctuation marks may result from the use of speech recognition technology to transcribe this document"  Problems: 1. Renal mass concerning for malignancy 2. Pulmonary nodules concerning for malignancy 3. Unintentional weight loss  History of Present Illness: Alicia [name] is a 79 y.o. female with past medical history of COPD, hyperlipidemia, hypertension, migraines, peripheral vascular disease, who presents for follow-up after recent testing for unintentional weight loss.  The patient was last seen on 08/09/2019. At that time, the patient was ordered to undergo a CT of the abdomen and chest, EGD and colonoscopy.  Importantly, his CT chest showed presence of numerous bilateral pulmonary nodules, which were present in the mediastinum, paratracheal area, lung parenchyma and left axillary lymph node.  She also had multiple nodules around the abdominal aorta.  There was presence of a 5 cm right renal mass which is different to multiple kidney cyst that the patient has bilaterally.  These findings were highly suspicious for metastatic renal cell carcinoma versus lymphoma versus lung carcinoma.  EGD showed 3 cm hiatal hernia without any other alterations.  Colonoscopy was positive for diverticulosis, 2 AVMs and a 2 mm polyp in the sigmoid colon which was removed (histology was positive for tubular adenoma).  Patient states that her appetite has severely worsened as nothing tastes good.  She has forced herself to drink Ensure as much as possible.  She overall feels very weak. The patient denies having any nausea,  vomiting,hematochezia, melena, hematemesis, abdominal distention, abdominal pain, diarrhea, jaundice, pruritus.  She comes to appointment with close friend as she does not have any family members that are close to help her during these times.  Past Medical History: Past Medical History:  Diagnosis Date  . Allergic rhinitis   . Anxiety   . Cataract   . COPD (chronic obstructive pulmonary disease) ([place])   . Depression   . DeQuervain's disease (tenosynovitis)   . Hyperlipidemia   . Hypertension   . Irritable bowel syndrome with constipation   . Knee fracture   . Low back pain   . Migraines   . Osteoarthritis   . Peptic ulcer disease   . PVD (peripheral vascular disease) ([place])   . Right rotator cuff tear     Past Surgical History: Past Surgical History:  Procedure Laterality Date  . back tumor    . BIOPSY  08/31/2019   Procedure: BIOPSY;  Surgeon: Harvel Quale, MD;  Location: [facility];  Service: Gastroenterology;;  . breast tumor    . CATARACT EXTRACTION W/PHACO Right 03/13/2012   Procedure: CATARACT EXTRACTION PHACO AND INTRAOCULAR LENS PLACEMENT (IOC);  Surgeon: Tonny Branch, MD;  Location: [facility] OR[facility];  Service: Ophthalmology;  Laterality: Right;  CDE:15.51  . CATARACT EXTRACTION W/PHACO Left 03/27/2012   Procedure: CATARACT EXTRACTION PHACO AND INTRAOCULAR LENS PLACEMENT (IOC);  Surgeon: Tonny Branch, MD;  Location: [facility];  Service: Ophthalmology;  Laterality: Left;  CDE=10.91  . COLONOSCOPY  12/08/2011   Procedure: COLONOSCOPY;  Surgeon: Daneil Dolin, MD;  Location: [facility];  Service: Endoscopy;  Laterality: N/A;  11:15 AM  . COLONOSCOPY WITH PROPOFOL N/A 08/31/2019   Procedure: COLONOSCOPY WITH PROPOFOL;  Surgeon: Harvel Quale,  MD;  [facility];  Service: Gastroenterology;  Laterality: N/A;  815  . CYST EXCISION Right July 2015   Right Preauricular area  . ESOPHAGOGASTRODUODENOSCOPY (EGD) WITH PROPOFOL N/A 08/31/2019   Procedure:  ESOPHAGOGASTRODUODENOSCOPY (EGD) WITH PROPOFOL;  Surgeon: Harvel Quale, MD;  Location[facility];  Service: Gastroenterology;  Laterality: N/A;  . PR VEIN BYPASS GRAFT,AORTO-FEM-POP    . right ankle otif    . right femoral to above the knee popliteal bypass    . right femur fracture    . right inguinal herniorraphy      Family History: Family History  Problem Relation Age of Onset  . Hypertension Mother   . Heart attack Mother   . Colon cancer Neg Hx     Social History: Social History   Tobacco Use  Smoking Status Current Every Day Smoker  . Packs/day: 1.00  . Years: 52.00  . Pack years: 52.00  . Types: Cigarettes  Smokeless Tobacco Never Used  Tobacco Comment   pt states she smokes about 3-4 cigarettes a day   Social History   Substance and Sexual Activity  Alcohol Use No   Comment: she quit 1 year ago   Social History   Substance and Sexual Activity  Drug Use No    Allergies: Allergies  Allergen Reactions  . Aspirin Other (See Comments)    stomach upset    Medications: Current Outpatient Medications  Medication Sig Dispense Refill  . amLODipine (NORVASC) 10 MG tablet Take 10 mg by mouth daily.    . cilostazol (PLETAL) 100 MG tablet Take 100 mg by mouth 2 (two) times daily.     Marland Kitchen gabapentin (NEURONTIN) 100 MG capsule Take 200 mg by mouth at bedtime.     Marland Kitchen losartan (COZAAR) 50 MG tablet Take 50 mg by mouth daily.     . mirtazapine (REMERON) 15 MG tablet Take 15 mg by mouth at bedtime.      Marland Kitchen oxybutynin (DITROPAN-XL) 10 MG 24 hr tablet Take 10 mg by mouth at bedtime.    . risedronate (ACTONEL) 35 MG tablet Take 35 mg by mouth every Thursday. with water on empty stomach, nothing by mouth or lie down for next 30 minutes.    . simvastatin (ZOCOR) 20 MG tablet Take 20 mg by mouth at bedtime.      . traMADol (ULTRAM) 50 MG tablet Take 50 mg by mouth 2 (two) times daily as needed.    Marland Kitchen acetaminophen (TYLENOL) 500 MG tablet Take 1 tablet (500 mg  total) by mouth every 6 (six) hours as needed. (Patient not taking: Reported on 08/09/2019) 30 tablet 0   No current facility-administered medications for this visit.   Facility-Administered Medications Ordered in Other Visits  Medication Dose Route Frequency Provider Last Rate Last Admin  . fentaNYL (SUBLIMAZE) injection 25-50 mcg  25-50 mcg Intravenous Q5 min PRN Lerry Liner, MD        Review of Systems: GENERAL: negative for malaise, night sweats HEENT: No changes in hearing or vision, no nose bleeds or other nasal problems. NECK: Negative for lumps, goiter, pain and significant neck swelling RESPIRATORY: Negative for cough, wheezing CARDIOVASCULAR: Negative for chest pain, leg swelling, palpitations, orthopnea GI: SEE HPI MUSCULOSKELETAL: Negative for joint pain or swelling, back pain, and muscle pain. SKIN: Negative for lesions, rash PSYCH: Negative for sleep disturbance, mood disorder and recent psychosocial stressors. HEMATOLOGY Negative for prolonged bleeding, bruising easily, and swollen nodes. ENDOCRINE: Negative for cold or heat  intolerance, polyuria, polydipsia and goiter. NEURO: negative for tremor, gait imbalance, syncope and seizures. The remainder of the review of systems is noncontributory.   Physical Exam: BP 132/87 (BP Location: Right Arm, Patient Position: Sitting, Cuff Size: Small)   Pulse (!) 128   Temp 99.5 F (37.5 C) (Oral)   Ht 5\' 1"  (1.549 m)   Wt 79 lb 8 oz (36.1 kg)   BMI 15.02 kg/m  GENERAL: The patient is AO x3, in no acute distress.  Looks cachectic. HEENT: Head is normocephalic and atraumatic. EOMI are intact. Mouth is hydrated and without lesions.  Patient has bilateral temporal wasting.  It appears as if her eyelids are swollen. NECK: Supple. No masses CHEST: The patient has presence of 3 cm firm and hard lymph node in her left axilla area. LUNGS: Clear to auscultation. No presence of rhonchi/wheezing/rales. Adequate chest expansion HEART:  RRR, normal s1 and s2. ABDOMEN: Soft, nontender, no guarding, no peritoneal signs, and nondistended. BS +. No masses. EXTREMITIES: Without any cyanosis, clubbing, rash, lesions or edema. NEUROLOGIC: AOx3, no focal motor deficit. SKIN: no jaundice, no rashes  Imaging/Labs: as above  I personally reviewed and interpreted the available labs, imaging and endoscopic files.  Impression and Plan: Alicia [name] is a 79 y.o. female with past medical history of COPD, hyperlipidemia, hypertension, migraines, peripheral vascular disease, who presents for follow-up after recent testing for unintentional weight loss.  The patient underwent work-up from the gastrointestinal perspective, without any culprit alterations in EGD and colonoscopy.  However, she was found to have multiple nodules in her lung parenchyma, paratracheal area, mediastinum and surrounding the abdominal aorta, with concomitant presence of renal mass in her right kidney, findings which were concerning for renal cell carcinoma versus lymphoma versus lung cancer, which would explain her symptomatology.  I had a very thorough discussion with the patient who understandably felt very sad.  At this point, she would benefit from having a comprehensive evaluation by oncology, urology and pulmonology to determine the best diagnostic and treatment course for her conditions.  I told her that goals of care will be discussed thoroughly with her given her age and extension of disease, which she understood.  I will order a urine cytology for now as part of the investigations for her primary malignancy.  - Referral to oncology - Referral to urology - Referral to pulmonology, possible bronchospy? - Continue Ensure TID - Check urine cytology  All questions were answered.      Harvel Quale, MD Gastroenterology and Hepatology [facility]

## 2019-09-10 NOTE — Patient Instructions (Signed)
Referral to oncology Referral to urology Referral to pulmonology

## 2019-09-12 ENCOUNTER — Other Ambulatory Visit (HOSPITAL_COMMUNITY)
Admission: RE | Admit: 2019-09-12 | Discharge: 2019-09-12 | Disposition: A | Discharge status: Home / Self Care | Payer: Medicare HMO | Source: Ambulatory Visit | Attending: Gastroenterology | Admitting: Gastroenterology

## 2019-09-15 LAB — CYTOLOGY - NON PAP

## 2019-09-16 DIAGNOSIS — J449 Chronic obstructive pulmonary disease, unspecified: Secondary | ICD-10-CM | POA: Diagnosis not present

## 2019-09-16 DIAGNOSIS — I10 Essential (primary) hypertension: Secondary | ICD-10-CM | POA: Diagnosis not present

## 2019-09-19 ENCOUNTER — Encounter (HOSPITAL_COMMUNITY): Payer: Self-pay

## 2019-09-19 NOTE — Progress Notes (Signed)
I have attempted to place an introductory phone call to this patient. Unable to reach patient at this time and VM box is full. I will plan to meet with the patient during initial visit with Dr. Delton Coombes.

## 2019-09-20 ENCOUNTER — Inpatient Hospital Stay (HOSPITAL_COMMUNITY): Payer: Medicare HMO | Attending: Hematology | Admitting: Hematology

## 2019-09-20 ENCOUNTER — Encounter (HOSPITAL_COMMUNITY): Payer: Self-pay | Admitting: Hematology

## 2019-09-20 ENCOUNTER — Inpatient Hospital Stay (HOSPITAL_COMMUNITY): Payer: Medicare HMO

## 2019-09-20 ENCOUNTER — Other Ambulatory Visit: Payer: Self-pay

## 2019-09-20 VITALS — BP 132/73 | HR 120 | Temp 97.2°F | Resp 16 | Ht 61.0 in | Wt 80.4 lb

## 2019-09-20 DIAGNOSIS — E7800 Pure hypercholesterolemia, unspecified: Secondary | ICD-10-CM | POA: Diagnosis not present

## 2019-09-20 DIAGNOSIS — R634 Abnormal weight loss: Secondary | ICD-10-CM | POA: Insufficient documentation

## 2019-09-20 DIAGNOSIS — N2889 Other specified disorders of kidney and ureter: Secondary | ICD-10-CM

## 2019-09-20 DIAGNOSIS — R918 Other nonspecific abnormal finding of lung field: Secondary | ICD-10-CM

## 2019-09-20 DIAGNOSIS — M1990 Unspecified osteoarthritis, unspecified site: Secondary | ICD-10-CM | POA: Insufficient documentation

## 2019-09-20 DIAGNOSIS — K5730 Diverticulosis of large intestine without perforation or abscess without bleeding: Secondary | ICD-10-CM | POA: Insufficient documentation

## 2019-09-20 DIAGNOSIS — I739 Peripheral vascular disease, unspecified: Secondary | ICD-10-CM | POA: Insufficient documentation

## 2019-09-20 DIAGNOSIS — J449 Chronic obstructive pulmonary disease, unspecified: Secondary | ICD-10-CM | POA: Insufficient documentation

## 2019-09-20 DIAGNOSIS — D125 Benign neoplasm of sigmoid colon: Secondary | ICD-10-CM | POA: Diagnosis not present

## 2019-09-20 DIAGNOSIS — K589 Irritable bowel syndrome without diarrhea: Secondary | ICD-10-CM | POA: Diagnosis not present

## 2019-09-20 DIAGNOSIS — F17210 Nicotine dependence, cigarettes, uncomplicated: Secondary | ICD-10-CM | POA: Insufficient documentation

## 2019-09-20 DIAGNOSIS — I10 Essential (primary) hypertension: Secondary | ICD-10-CM | POA: Insufficient documentation

## 2019-09-20 DIAGNOSIS — F418 Other specified anxiety disorders: Secondary | ICD-10-CM | POA: Diagnosis not present

## 2019-09-20 DIAGNOSIS — R69 Illness, unspecified: Secondary | ICD-10-CM | POA: Diagnosis not present

## 2019-09-20 LAB — COMPREHENSIVE METABOLIC PANEL
ALT: 8 U/L (ref 0–44)
AST: 12 U/L — ABNORMAL LOW (ref 15–41)
Albumin: 3.3 g/dL — ABNORMAL LOW (ref 3.5–5.0)
Alkaline Phosphatase: 79 U/L (ref 38–126)
Anion gap: 13 (ref 5–15)
BUN: 30 mg/dL — ABNORMAL HIGH (ref 8–23)
CO2: 23 mmol/L (ref 22–32)
Calcium: 9.3 mg/dL (ref 8.9–10.3)
Chloride: 99 mmol/L (ref 98–111)
Creatinine, Ser: 0.87 mg/dL (ref 0.44–1.00)
GFR calc Af Amer: >60 mL/min (ref >60)
GFR calc non Af Amer: >60 mL/min (ref >60)
Glucose, Bld: 102 mg/dL — ABNORMAL HIGH (ref 70–99)
Potassium: 4.2 mmol/L (ref 3.5–5.1)
Sodium: 135 mmol/L (ref 135–145)
Total Bilirubin: 0.4 mg/dL (ref 0.3–1.2)
Total Protein: 7.9 g/dL (ref 6.5–8.1)

## 2019-09-20 LAB — CBC WITH DIFFERENTIAL/PLATELET
Abs Immature Granulocytes: 0.03 10*3/uL (ref 0.00–0.07)
Basophils Absolute: 0 10*3/uL (ref 0.0–0.1)
Basophils Relative: 0 %
Eosinophils Absolute: 0.1 10*3/uL (ref 0.0–0.5)
Eosinophils Relative: 1 %
HCT: 34.6 % — ABNORMAL LOW (ref 36.0–46.0)
Hemoglobin: 10.3 g/dL — ABNORMAL LOW (ref 12.0–15.0)
Immature Granulocytes: 0 %
Lymphocytes Relative: 13 %
Lymphs Abs: 1.6 10*3/uL (ref 0.7–4.0)
MCH: 24.9 pg — ABNORMAL LOW (ref 26.0–34.0)
MCHC: 29.8 g/dL — ABNORMAL LOW (ref 30.0–36.0)
MCV: 83.6 fL (ref 80.0–100.0)
Monocytes Absolute: 0.9 10*3/uL (ref 0.1–1.0)
Monocytes Relative: 7 %
Neutro Abs: 9.7 10*3/uL — ABNORMAL HIGH (ref 1.7–7.7)
Neutrophils Relative %: 79 %
Platelets: 478 10*3/uL — ABNORMAL HIGH (ref 150–400)
RBC: 4.14 MIL/uL (ref 3.87–5.11)
RDW: 16.2 % — ABNORMAL HIGH (ref 11.5–15.5)
WBC: 12.4 10*3/uL — ABNORMAL HIGH (ref 4.0–10.5)
nRBC: 0 % (ref 0.0–0.2)

## 2019-09-20 LAB — LACTATE DEHYDROGENASE: LDH: 314 U/L — ABNORMAL HIGH (ref 98–192)

## 2019-09-20 NOTE — Patient Instructions (Signed)
[place] at [facility] Discharge Instructions  You were seen and examined today by Dr. Delton Coombes. Dr. Delton Coombes is a medical oncologist, meaning he specializes in the medical management of cancer. Dr. Delton Coombes discussed your past medical history, family history of cancer and current functional status.  There is a mass present in your right kidney, on the backside of your stomach and small nodules in you lung.  It is important to identify cancer that is present in your body. Dr. Delton Coombes has ordered a PET scan, which will identify any areas of cancer present in your body. Once cancer is identified, Dr. Delton Coombes can order a biopsy to identify what type of cancer it is.  You will have blood work done today. You will return to the clinic following the PET scan.   Thank you for choosing [place] at [facility] to provide your oncology and hematology care.  To afford each patient quality time with our provider, please arrive at least 15 minutes before your scheduled appointment time.   If you have a lab appointment with the [facility] please come in thru the Main Entrance and check in at the main information desk.  You need to re-schedule your appointment should you arrive 10 or more minutes late.  We strive to give you quality time with our providers, and arriving late affects you and other patients whose appointments are after yours.  Also, if you no show three or more times for appointments you may be dismissed from the clinic at the providers discretion.     Again, thank you for choosing [facility].  Our hope is that these requests will decrease the amount of time that you wait before being seen by our physicians.       _____________________________________________________________  Should you have questions after your visit to [facility], please contact our office at [phone] and follow the prompts.  Our  office hours are 8:00 a.m. and 4:30 p.m. Monday - Friday.  Please note that voicemails left after 4:00 p.m. may not be returned until the following business day.  We are closed weekends and major holidays.  You do have access to a nurse 24-7, just call the main number to the clinic [phone] and do not press any options, hold on the line and a nurse will answer the phone.    For prescription refill requests, have your pharmacy contact our office and allow 72 hours.    Due to Covid, you will need to wear a mask upon entering the hospital. If you do not have a mask, a mask will be given to you at the Main Entrance upon arrival. For doctor visits, patients may have 1 support person age 79 or older with them. For treatment visits, patients can not have anyone with them due to social distancing guidelines and our immunocompromised population.

## 2019-09-20 NOTE — Progress Notes (Signed)
[place] [name] [place] [address]   CLINIC:  Medical Oncology/Hematology  Patient Care Team: Rosita Fire, MD as PCP - General (Internal Medicine) Irine Seal, MD as Attending Physician (Urology) Leta Baptist, MD (Otolaryngology) Dishmon, Garwin Brothers, RN as Oncology Nurse Navigator (Oncology)  CHIEF COMPLAINTS/PURPOSE OF CONSULTATION:  Evaluation of lung nodules and renal mass  HISTORY OF PRESENTING ILLNESS:  Alicia [name] 79 y.o. female is here because of evaluation of multiple lung nodules and right renal mass, at the request of Dr. Montez Morita. She had a CT AP done on 8/17 for unintentional weight loss which showed multiple pulmonary nodules, along with a heterogenous right renal mass, and a new right hepatic lesion.  Today she is accompanied by her friend, Alicia [name]. She reports that she has lost 20 lbs in 6 months starting from January 2021. Her appetite comes on intermittently, but other days she eats very little since food and water tastes bad; she supplements her meals with Ensures whenever she does not eat well. She denies having hematochezia or hematuria or dysuria. She denies having any new muscle aches or pains, back or abdominal pain. She is not currently on oxygen. She is not diabetic.  She lives at home with her dog. She is able to do all of her ADL's at home. She quit smoking on 08/02/2019 after smoking 2 PPD for 60 years. She used to do house cleaning. She does not know enough about her family history to know about cancer history.   MEDICAL HISTORY:  Past Medical History:  Diagnosis Date   Allergic rhinitis    Anxiety    Cataract    COPD (chronic obstructive pulmonary disease) (HCC)    Depression    DeQuervain's disease (tenosynovitis)    Hyperlipidemia    Hypertension    Irritable bowel syndrome with constipation    Knee fracture    Low back pain    Migraines    Osteoarthritis    Peptic ulcer disease    PVD  (peripheral vascular disease) ([place])    Right rotator cuff tear     SURGICAL HISTORY: Past Surgical History:  Procedure Laterality Date   back tumor     BIOPSY  08/31/2019   Procedure: BIOPSY;  Surgeon: Harvel Quale, MD;  Location[facility];  Service: Gastroenterology;;   breast tumor     CATARACT EXTRACTION W/PHACO Right 03/13/2012   Procedure: CATARACT EXTRACTION PHACO AND INTRAOCULAR LENS PLACEMENT ([place]);  Surgeon: Tonny Branch, MD;  Location: [facility];  Service: Ophthalmology;  Laterality: Right;  CDE:15.51   CATARACT EXTRACTION W/PHACO Left 03/27/2012   Procedure: CATARACT EXTRACTION PHACO AND INTRAOCULAR LENS PLACEMENT (IOC);  Surgeon: Tonny Branch, MD;  Location: [facility];  Service: Ophthalmology;  Laterality: Left;  CDE=10.91   cataract surgery     COLONOSCOPY  12/08/2011   Procedure: COLONOSCOPY;  Surgeon: Daneil Dolin, MD;  Location: [facility];  Service: Endoscopy;  Laterality: N/A;  11:15 AM   COLONOSCOPY WITH PROPOFOL N/A 08/31/2019   Procedure: COLONOSCOPY WITH PROPOFOL;  Surgeon: Harvel Quale, MD;  [facility];  Service: Gastroenterology;  Laterality: N/A;  815   CYST EXCISION Right July 2015   Right Preauricular area   ESOPHAGOGASTRODUODENOSCOPY (EGD) WITH PROPOFOL N/A 08/31/2019   Procedure: ESOPHAGOGASTRODUODENOSCOPY (EGD) WITH PROPOFOL;  Surgeon: Harvel Quale, MD;  Location: [facility];  Service: Gastroenterology;  Laterality: N/A;   PR VEIN BYPASS GRAFT,AORTO-FEM-POP  right ankle otif     right femoral to above the knee popliteal bypass     right femur fracture     right inguinal herniorraphy      SOCIAL HISTORY: Social History   Socioeconomic History   Marital status: Single    Spouse name: Not on file   Number of children: 0   Years of education: Not on file   Highest education level: Not on file  Occupational History   Not on file  Tobacco Use   Smoking status: Current Every  Day Smoker    Packs/day: 1.00    Years: 52.00    Pack years: 52.00    Types: Cigarettes   Smokeless tobacco: Never Used   Tobacco comment: pt states she smokes about 3-4 cigarettes a day  Vaping Use   Vaping Use: Never assessed  Substance and Sexual Activity   Alcohol use: No    Comment: she quit 1 year ago   Drug use: No   Sexual activity: Not on file  Other Topics Concern   Not on file  Social History Narrative   Not on file   Social Determinants of Health   Financial Resource Strain: Low Risk    Difficulty of Paying Living Expenses: Not very hard  Food Insecurity: No Food Insecurity   Worried About Running Out of Food in the Last Year: Never true   Ran Out of Food in the Last Year: Never true  Transportation Needs: No Transportation Needs   Lack of Transportation (Medical): No   Lack of Transportation (Non-Medical): No  Physical Activity: Inactive   Days of Exercise per Week: 0 days   Minutes of Exercise per Session: 0 min  Stress: Stress Concern Present   Feeling of Stress : To some extent  Social Connections: Moderately Isolated   Frequency of Communication with Friends and Family: Three times a week   Frequency of Social Gatherings with Friends and Family: Three times a week   Attends Religious Services: More than 4 times per year   Active Member of Clubs or Organizations: No   Attends Archivist Meetings: Never   Marital Status: Never married  Human resources officer Violence: Not At Risk   Fear of Current or Ex-Partner: No   Emotionally Abused: No   Physically Abused: No   Sexually Abused: No    FAMILY HISTORY: Family History  Problem Relation Age of Onset   Hypertension Mother    Heart attack Mother    Stroke Mother    Colon cancer Neg Hx     ALLERGIES:  is allergic to aspirin.  MEDICATIONS:  Current Outpatient Medications  Medication Sig Dispense Refill   albuterol (VENTOLIN HFA) 108 (90 Base) MCG/ACT inhaler  Inhale into the lungs.     amLODipine (NORVASC) 10 MG tablet Take 10 mg by mouth daily.     cilostazol (PLETAL) 100 MG tablet Take 100 mg by mouth 2 (two) times daily.      fluticasone (FLONASE) 50 MCG/ACT nasal spray Place 1 spray into both nostrils daily.     gabapentin (NEURONTIN) 100 MG capsule Take 200 mg by mouth at bedtime.      losartan (COZAAR) 50 MG tablet Take 50 mg by mouth daily.      mirtazapine (REMERON) 15 MG tablet Take 15 mg by mouth at bedtime.       oxybutynin (DITROPAN-XL) 10 MG 24 hr tablet Take 10 mg by mouth at bedtime.     risedronate (ACTONEL)  35 MG tablet Take 35 mg by mouth every Thursday. with water on empty stomach, nothing by mouth or lie down for next 30 minutes.     simvastatin (ZOCOR) 20 MG tablet Take 20 mg by mouth at bedtime.       acetaminophen (TYLENOL) 500 MG tablet Take 1 tablet (500 mg total) by mouth every 6 (six) hours as needed. (Patient not taking: Reported on 08/09/2019) 30 tablet 0   traMADol (ULTRAM) 50 MG tablet Take 50 mg by mouth 2 (two) times daily as needed. (Patient not taking: Reported on 09/20/2019)     No current facility-administered medications for this visit.   Facility-Administered Medications Ordered in Other Visits  Medication Dose Route Frequency Provider Last Rate Last Admin   fentaNYL (SUBLIMAZE) injection 25-50 mcg  25-50 mcg Intravenous Q5 min PRN Lerry Liner, MD        REVIEW OF SYSTEMS:   Review of Systems  Constitutional: Positive for appetite change (severely decreased) and fatigue (severe).  Respiratory: Positive for cough and shortness of breath.   Gastrointestinal: Negative for abdominal pain and blood in stool.  Genitourinary: Negative for dysuria and hematuria.   Musculoskeletal: Negative for arthralgias, back pain and myalgias.  Neurological: Positive for dizziness (occasional).  All other systems reviewed and are negative.    PHYSICAL EXAMINATION: ECOG PERFORMANCE STATUS: 1 - Symptomatic but  completely ambulatory  Vitals:   09/20/19 1327  BP: 132/73  Pulse: (!) 120  Resp: 16  Temp: (!) 97.2 F (36.2 C)  SpO2: 94%   Filed Weights   09/20/19 1327  Weight: 80 lb 6.4 oz (36.5 kg)   Physical Exam Vitals reviewed.  Constitutional:      Appearance: Normal appearance.  Cardiovascular:     Rate and Rhythm: Normal rate and regular rhythm.     Pulses: Normal pulses.     Heart sounds: Normal heart sounds.  Pulmonary:     Effort: Pulmonary effort is normal.     Breath sounds: Normal breath sounds.  Abdominal:     Palpations: Abdomen is soft. There is no mass.     Tenderness: There is no abdominal tenderness.     Hernia: No hernia is present.  Lymphadenopathy:     Upper Body:     Left upper body: Axillary adenopathy (tender) present.  Neurological:     General: No focal deficit present.     Mental Status: She is alert and oriented to person, place, and time.  Psychiatric:        Mood and Affect: Mood normal.        Behavior: Behavior normal.      LABORATORY DATA:  I have reviewed the data as listed Recent Results (from the past 2160 hour(s))  Novel Coronavirus, NAA ([facility])     Status: None   Collection Time: 07/26/19 11:04 AM   Specimen: Nasopharyngeal(NP) swabs in vial transport medium   Nasopharynge  Result Value Ref Range   SARS-CoV-2, NAA Not Detected Not Detected    Comment: This nucleic acid amplification test was developed and its performance characteristics determined by [employer]. Nucleic acid amplification tests include RT-PCR and TMA. This test has not been FDA cleared or approved. This test has been authorized by FDA under an Emergency Use Authorization (EUA). This test is only authorized for the duration of time the declaration that circumstances exist justifying the authorization of the emergency use of in vitro diagnostic tests for detection of SARS-CoV-2 virus and/or diagnosis of COVID-19 infection under  section 564(b)(1) of the  Act, 21 U.S.C. 106YIR-4(W) (1), unless the authorization is terminated or revoked sooner. When diagnostic testing is negative, the possibility of a false negative result should be considered in the context of a patient's recent exposures and the presence of clinical signs and symptoms consistent with COVID-19. An individual without symptoms of COVID-19 and who is not shedding SARS-CoV-2 virus wo uld expect to have a negative (not detected) result in this assay.   SARS-COV-2, NAA 2 DAY TAT     Status: None   Collection Time: 07/26/19 11:04 AM   Nasopharynge  Result Value Ref Range   SARS-CoV-2, NAA 2 DAY TAT Performed   CBC     Status: Abnormal   Collection Time: 08/09/19  2:46 PM  Result Value Ref Range   WBC 11.2 (H) 3.8 - 10.8 Thousand/uL   RBC 4.94 3.80 - 5.10 Million/uL   Hemoglobin 13.1 11.7 - 15.5 g/dL   HCT 40.7 35 - 45 %   MCV 82.4 80.0 - 100.0 fL   MCH 26.5 (L) 27.0 - 33.0 pg   MCHC 32.2 32.0 - 36.0 g/dL   RDW 14.2 11.0 - 15.0 %   Platelets 435 (H) 140 - 400 Thousand/uL   MPV 11.7 7.5 - 12.5 fL  Comprehensive metabolic panel     Status: Abnormal   Collection Time: 08/09/19  2:46 PM  Result Value Ref Range   Glucose, Bld 118 (H) 65 - 99 mg/dL    Comment: .            Fasting reference interval . For someone without known diabetes, a glucose value between 100 and 125 mg/dL is consistent with prediabetes and should be confirmed with a follow-up test. .    BUN 14 7 - 25 mg/dL   Creat 0.90 0.60 - 0.93 mg/dL    Comment: For patients >21 years of age, the reference limit for Creatinine is approximately 13% higher for people identified as African-American. .    BUN/Creatinine Ratio NOT APPLICABLE 6 - 22 (calc)   Sodium 137 135 - 146 mmol/L   Potassium 4.6 3.5 - 5.3 mmol/L   Chloride 99 98 - 110 mmol/L   CO2 26 20 - 32 mmol/L   Calcium 10.0 8.6 - 10.4 mg/dL   Total Protein 7.8 6.1 - 8.1 g/dL   Albumin 4.0 3.6 - 5.1 g/dL   Globulin 3.8 (H) 1.9 - 3.7 g/dL (calc)     AG Ratio 1.1 1.0 - 2.5 (calc)   Total Bilirubin 0.5 0.2 - 1.2 mg/dL   Alkaline phosphatase (APISO) 92 37 - 153 U/L   AST 8 (L) 10 - 35 U/L   ALT 3 (L) 6 - 29 U/L  Magnesium     Status: None   Collection Time: 08/09/19  2:46 PM  Result Value Ref Range   Magnesium 1.7 1.5 - 2.5 mg/dL  Phosphorus     Status: None   Collection Time: 08/09/19  2:46 PM  Result Value Ref Range   Phosphorus 4.3 2.1 - 4.3 mg/dL  SARS CORONAVIRUS 2 (TAT 6-24 HRS) Nasopharyngeal Nasopharyngeal Swab     Status: None   Collection Time: 08/29/19 11:11 AM   Specimen: Nasopharyngeal Swab  Result Value Ref Range   SARS Coronavirus 2 NEGATIVE NEGATIVE    Comment: (NOTE) SARS-CoV-2 target nucleic acids are NOT DETECTED.  The SARS-CoV-2 RNA is generally detectable in upper and lower respiratory specimens during the acute phase of infection. Negative results do not preclude  SARS-CoV-2 infection, do not rule out co-infections with other pathogens, and should not be used as the sole basis for treatment or other patient management decisions. Negative results must be combined with clinical observations, patient history, and epidemiological information. The expected result is Negative.  Fact Sheet for Patients: SugarRoll.be  Fact Sheet for Healthcare Providers: https://www.woods-mathews.com/  This test is not yet approved or cleared by the [place] FDA and  has been authorized for detection and/or diagnosis of SARS-CoV-2 by FDA under an Emergency Use Authorization (EUA). This EUA will remain  in effect (meaning this test can be used) for the duration of the COVID-19 declaration under Se ction 564(b)(1) of the Act, 21 U.S.C. section 360bbb-3(b)(1), unless the authorization is terminated or revoked sooner.  Performed at [facility], [place] [address] [place], [place] [address]   Surgical pathology     Status: None   Collection Time: 08/31/19  9:09 AM  Result  Value Ref Range   SURGICAL PATHOLOGY      SURGICAL PATHOLOGY CASE: [id number] PATIENT: Alayza Coen Surgical Pathology Report     Clinical History: weight loss, early satiety   FINAL MICROSCOPIC DIAGNOSIS:  A. COLON, SIGMOID, POLYPECTOMY: -  Tubular adenoma (1 of 1 fragments) -  No high grade dysplasia or malignancy identified   GROSS DESCRIPTION:  Received in formalin is a tan, soft tissue fragment that is submitted in toto.  Size: 0.2 cm, 1 block submitted.  (AK 08/31/2019)    Final Diagnosis performed by Thressa Sheller, MD.   Electronically signed 09/03/2019 Technical component performed at [facility] [address] [place], [place] [address].  Professional component performed at [employer]. [facility], [place] [name], [place], [place] [address].  Immunohistochemistry Technical component (if applicable) was performed at [facility]. [address] [place], [place], [place] [address].   IMMUNOHISTOCHEMISTRY DISCLAIMER (if applicable): Some of t hese immunohistochemical stains may have been developed and the performance characteristics determine by [facility]. Some may not have been cleared or approved by the U.S. Food and Drug Administration. The FDA has determined that such clearance or approval is not necessary. This test is used for clinical purposes. It should not be regarded as investigational or for research. This laboratory is certified under the [place] (CLIA-88) as qualified to perform high complexity clinical laboratory testing.  The controls stained appropriately.   Cytology - non gyn     Status: None   Collection Time: 09/10/19  2:04 PM  Result Value Ref Range   CYTOLOGY - NON GYN      CYTOLOGY - NON PAP CASE: [id number] PATIENT: Rian Mcgibbon Non-Gynecological Cytology Report     Clinical History: Right Renal mass( 5 cm), lung nodules by  CT Specimen Submitted:  A. URINE, VOIDED:   FINAL MICROSCOPIC DIAGNOSIS: - Negative for high grade urothelial carcinoma  SPECIMEN ADEQUACY: Satisfactory for evaluation  GROSS: Received is/are 30cc'c of dark yellow fluid(DB:db) Prepared: Smears: 0 Concentration Method (ThinPrep): 1 Cell Block: 0 Additional Studies:     Final Diagnosis performed by Thressa Sheller, MD.   Electronically signed 09/15/2019 Technical component performed at [facility], [place] [address], [place], [place] [address].  Professional component performed at [employer]. [facility], [place] [address] [place], [place] [address].  Immunohistochemistry Technical component (if applicable) was performed at [facility]. [address] [place], [place], [place] [address].   IMMUNOHISTOCHEMISTRY  DISCLAIMER (if  applicable): Some of these immunohistochemical stains may have been developed and the performance characteristics determine by [facility]. Some may not have been cleared or approved by the U.S. Food and Drug Administration. The FDA has determined that such clearance or approval is not necessary. This test is used for clinical purposes. It should not be regarded as investigational or for research. This laboratory is certified under the [place] (CLIA-88) as qualified to perform high complexity clinical laboratory testing.  The controls stained appropriately.     RADIOGRAPHIC STUDIES: I have personally reviewed the radiological images as listed and agreed with the findings in the report. CT CHEST ABDOMEN PELVIS W CONTRAST  Result Date: 09/05/2019 CLINICAL DATA:  Weight loss.  Mid abdominal pain. EXAM: CT CHEST, ABDOMEN, AND PELVIS WITH CONTRAST TECHNIQUE: Multidetector CT imaging of the chest, abdomen and pelvis was performed following the standard protocol during bolus administration of intravenous contrast.  CONTRAST:  41mL OMNIPAQUE IOHEXOL 300 MG/ML  SOLN COMPARISON:  CT chest 01/23/2015 and CT abdomen from 07/23/2010 FINDINGS: CT CHEST FINDINGS Cardiovascular: Coronary, aortic arch, and branch vessel atherosclerotic vascular disease. Mediastinum/Nodes: Pathologic prevascular lymph node 2.0 cm in short axis on image 16/2, new compared to the prior exam. Adjacent additional prevascular adenopathy noted. Left axillary structure probably representing lymph node with a interspersed fatty hilum measures 1.2 cm in short axis on image 17/2. Right paratracheal node 0.8 cm in short axis on image 15/2, previously 0.5 cm. Suspected small type 1 hiatal hernia. Lungs/Pleura: New scattered bilateral pulmonary nodules are present compared to 01/23/2015. An index left lower lobe nodule measures 0.7 by 0.6 cm on image 80/4. An index pleural-based nodule along the right lateral costophrenic angle measures 1.3 by 0.5 cm on image 95/4. Linear scarring in the lingula. Musculoskeletal: A lipoma extends along the right upper scapula displacing portions of the supraspinatus and infraspinatus. This is likely scalloping the posterior glenoid on image 18/4 in a manner similar to the prior exam. Old healed bilateral rib fractures. Old T12 wedge compression fracture. Severely reduced adipose tissue levels suggesting cachexia. CT ABDOMEN PELVIS FINDINGS Hepatobiliary: Several fluid density hepatic lesions are similar to the 2012 exam and likely cyst. There is also a nonspecific 1.0 by 0.8 cm right hepatic lobe hypodense lesion on image 48/2 laterally which is not readily apparent on the 07/23/2010 exam and is nonspecific due to small size. The gallbladder appears unremarkable. Pancreas: Unremarkable Spleen: Unremarkable Adrenals/Urinary Tract: The adrenal glands appear unremarkable. Numerous bilateral renal cysts are present. There is also an irregular and heterogeneous complex lesion favoring mass anteriorly in the right mid to lower kidney  measuring 5.1 by 4.0 by 5.0 cm, highly suspicious for malignancy such as renal cell carcinoma or a metastatic lesion. This appears to probably be invading into the renal hilum in the lower pole, and there could be a small amount of localized renal vein tributary tumor thrombus although I do not see a filling defect in the main renal vein or IVC. The other renal hypodense lesions are fairly homogeneous and probably cysts of varying complexity. The number and size of the cystic lesions has substantially increased from the 2012 examination. A 1.5 by 1.2 cm lesion adjacent to the left kidney lower pole is probably a complex cyst given the homogeneity, less likely to be a tumor implant. Bilateral peripelvic cysts are present. It is difficult to follow the ureters due to the surrounding edema and densities. The urinary bladder is empty. Stomach/Bowel: Unremarkable  Vascular/Lymphatic: Aortoiliac atherosclerotic vascular disease. Just below the level of the renal arteries there is a large rind of heterogeneous periaortic tumor which lifts the aorta away from the spine. The tumor at this level measures 8.5 by 5.7 cm on image 59/2. Bulky and irregular conglomerate adenopathy extends up to the level of the renal vessels and also tracks down along the abdominal aorta just past the level of the bifurcation. 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 There appears to be an occluded right common femoral to distal graft in the right lower extremity. Reproductive: Probable retroverted uterus. A fatty mass just above this is probably a dermoid cyst and measures 4.7 by 4.2 cm on image 85/2, roughly similar to prior. A right-sided dermoid cyst is present measuring about 2.9 by 1.8 cm, similar to prior. Other: There is abnormal hazy stranding in the subcutaneous tissues and in the omentum and mesentery, along with a small amount of ascites best appreciated in the pelvis and  along the paracolic gutters. Below the pancreatic tail, a central mesenteric lymph node measures 1.2 cm in short axis on image 53/2. Musculoskeletal: Right femoral IM nail, chronic. Chronic inferior endplate compression at L4 with some increased sclerosis along the margin compared to the 2012 exam. Lumbar spondylosis and degenerative disc disease leading to multilevel impingement. IMPRESSION: 1. Numerous small bilateral pulmonary nodules along with a large dense rind of tumor/adenopathy surrounding the abdominal aorta and elevating the aorta away from the spine, as well as a heterogeneous 5 cm right renal mass, pathologically enlarged prevascular adenopathy in the chest, probable enlarged left axillary lymph node, and enlarged central mesenteric lymph node. Top differential diagnostic considerations include metastatic right renal cell carcinoma, or possibly lymphoma with involvement of the right kidney and either lymphomatous or infectious involvement of the lungs. 2. Nonspecific new 1.0 cm right hepatic lobe hypodense lesion (new compared to the 2012 exam). 3. Subcutaneous and mesenteric edema with a small amount of ascites. Cachexia noted. 4. 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 Advanced atherosclerosis. 5. Other imaging findings of potential clinical significance: Aortic Atherosclerosis (ICD10-I70.0). Coronary atherosclerosis. Small type 1 hiatal hernia. 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 Progressive cystic lesions in the kidneys, some of which are complex. Multilevel lumbar impingement. Electronically Signed   By: Van Clines M.D.   On: 09/05/2019 11:32    ASSESSMENT:  1.  Right kidney mass: -Evaluation for weight loss with CT CAP on 09/04/2019 showed 5 cm right kidney mass, pathologically enlarged prevascular lymph nodes in the chest, enlarged left axillary lymph node, central  mesenteric lymph node, adenopathy surrounding the abdominal aorta and numerous small bilateral pulmonary nodules.  2.  Weight loss: -30 to 40 pound weight loss since January of this year.  Initially had vomiting which subsided at this time.  She does report occasional night sweats. -Colonoscopy on 08/31/2019 shows diverticulosis in the sigmoid colon, descending colon and and ascending colon.  2 nonbleeding colonic angiodysplastic lesions.  One 2 millimeter polyp in the sigmoid colon. -EGD on 08/31/2019 shows 3 cm hiatal hernia, normal stomach, duodenum.  3.  Social/family history: -Quit smoking 2 and half months ago.  She lives alone with her dog. -Smoked 1 to 2 packs/day for 60 years. -No family history of malignancies.   PLAN:  1.  Right kidney mass and adenopathy: -We reviewed images of the  CT scan with the patient and her friend. -Differential diagnosis includes metastatic kidney cancer versus lymphoma. -She also has subcutaneous nodule on the posterior right shoulder and posterior right hip. -I will check an LDH level.  She also has some night sweats. -I have recommended PET CT scan which will aid [place] in picking the right area for biopsy. -I will see her back after the PET scan.  2.  Weight loss: -She has gained back about 10 pounds in the last few weeks. -She is drinking about 3 ensures per day.  She reports loss of appetite.   All questions were answered. The patient knows to call the clinic with any problems, questions or concerns.   Derek Jack, MD 09/20/19 1:58 PM  [place] [phone]   I, Milinda Antis, am acting as a scribe for Dr. Sanda Linger.  I, Derek Jack MD, have reviewed the above documentation for accuracy and completeness, and I agree with the above.

## 2019-09-24 LAB — PROTEIN ELECTROPHORESIS, SERUM
A/G Ratio: 0.7 (ref 0.7–1.7)
Albumin ELP: 3 g/dL (ref 2.9–4.4)
Alpha-1-Globulin: 0.6 g/dL — ABNORMAL HIGH (ref 0.0–0.4)
Alpha-2-Globulin: 1.1 g/dL — ABNORMAL HIGH (ref 0.4–1.0)
Beta Globulin: 1.2 g/dL (ref 0.7–1.3)
Gamma Globulin: 1.2 g/dL (ref 0.4–1.8)
Globulin, Total: 4.1 g/dL — ABNORMAL HIGH (ref 2.2–3.9)
Total Protein ELP: 7.1 g/dL (ref 6.0–8.5)

## 2019-10-01 ENCOUNTER — Other Ambulatory Visit: Payer: Self-pay

## 2019-10-01 ENCOUNTER — Ambulatory Visit (HOSPITAL_COMMUNITY)
Admission: RE | Admit: 2019-10-01 | Discharge: 2019-10-01 | Disposition: A | Discharge status: Home / Self Care | Payer: Medicare HMO | Source: Ambulatory Visit | Attending: Hematology | Admitting: Hematology

## 2019-10-01 DIAGNOSIS — C7951 Secondary malignant neoplasm of bone: Secondary | ICD-10-CM | POA: Diagnosis not present

## 2019-10-01 DIAGNOSIS — C801 Malignant (primary) neoplasm, unspecified: Secondary | ICD-10-CM | POA: Diagnosis not present

## 2019-10-01 DIAGNOSIS — C787 Secondary malignant neoplasm of liver and intrahepatic bile duct: Secondary | ICD-10-CM | POA: Diagnosis not present

## 2019-10-01 DIAGNOSIS — R918 Other nonspecific abnormal finding of lung field: Secondary | ICD-10-CM | POA: Insufficient documentation

## 2019-10-01 DIAGNOSIS — N2889 Other specified disorders of kidney and ureter: Secondary | ICD-10-CM | POA: Insufficient documentation

## 2019-10-01 MED ORDER — FLUDEOXYGLUCOSE F - 18 (FDG) INJECTION
6.8500 | Freq: Once | INTRAVENOUS | Status: AC | PRN
  Administered 2019-10-01: 6.85 via INTRAVENOUS

## 2019-10-08 ENCOUNTER — Other Ambulatory Visit: Payer: Self-pay

## 2019-10-08 ENCOUNTER — Other Ambulatory Visit (HOSPITAL_COMMUNITY): Payer: Self-pay | Admitting: *Deleted

## 2019-10-08 ENCOUNTER — Inpatient Hospital Stay (HOSPITAL_BASED_OUTPATIENT_CLINIC_OR_DEPARTMENT_OTHER): Payer: Medicare HMO | Admitting: Hematology

## 2019-10-08 VITALS — BP 133/82 | HR 124 | Temp 96.8°F | Resp 19 | Wt 77.2 lb

## 2019-10-08 DIAGNOSIS — M1990 Unspecified osteoarthritis, unspecified site: Secondary | ICD-10-CM | POA: Diagnosis not present

## 2019-10-08 DIAGNOSIS — R69 Illness, unspecified: Secondary | ICD-10-CM | POA: Diagnosis not present

## 2019-10-08 DIAGNOSIS — R918 Other nonspecific abnormal finding of lung field: Secondary | ICD-10-CM | POA: Diagnosis not present

## 2019-10-08 DIAGNOSIS — K769 Liver disease, unspecified: Secondary | ICD-10-CM

## 2019-10-08 DIAGNOSIS — K589 Irritable bowel syndrome without diarrhea: Secondary | ICD-10-CM | POA: Diagnosis not present

## 2019-10-08 DIAGNOSIS — J449 Chronic obstructive pulmonary disease, unspecified: Secondary | ICD-10-CM | POA: Diagnosis not present

## 2019-10-08 DIAGNOSIS — I10 Essential (primary) hypertension: Secondary | ICD-10-CM | POA: Diagnosis not present

## 2019-10-08 DIAGNOSIS — I739 Peripheral vascular disease, unspecified: Secondary | ICD-10-CM | POA: Diagnosis not present

## 2019-10-08 DIAGNOSIS — N2889 Other specified disorders of kidney and ureter: Secondary | ICD-10-CM | POA: Diagnosis not present

## 2019-10-08 DIAGNOSIS — E7800 Pure hypercholesterolemia, unspecified: Secondary | ICD-10-CM | POA: Diagnosis not present

## 2019-10-08 MED ORDER — HYDROCODONE-ACETAMINOPHEN 5-325 MG PO TABS: 1.0000 | ORAL_TABLET | Freq: Three times a day (TID) | ORAL | 0 refills | Status: DC | PRN

## 2019-10-08 NOTE — Progress Notes (Signed)
[place] [address] [place], [place] [address]   CLINIC:  Medical Oncology/Hematology  PCP:  Rosita Fire, MD [place] / [place] [id number] [phone]   REASON FOR VISIT:  Follow-up for right kidney mass  PRIOR THERAPY: None  NGS Results: Not done  CURRENT THERAPY: Under work-up  BRIEF ONCOLOGIC HISTORY:  Oncology History   No history exists.    CANCER STAGING: Cancer Staging No matching staging information was found for the patient.  INTERVAL HISTORY:  [name], a 79 y.o. female, returns for routine follow-up of her right kidney mass. Alicia [name] was last seen on 09/20/2019.   Today she reports not feeling good. She complains of having CP since she tried to lift up a heavy garden pot; now she has pain in her sternum and mid upper back when she takes a deep breath. She takes gabapentin and tramadol, but she can't tell if it helps with the pain. She has regular bowel movements with 1-3 BM's a day. She also complains of not being able to sleep which is a chronic issue for her; she takes Remeron at bedtime.  She is scheduled to see Dr. Alyson Ingles on 10/5.  REVIEW OF SYSTEMS:  Review of Systems  Constitutional: Positive for appetite change (mildly decreased) and fatigue (depleted).  HENT:   Positive for trouble swallowing (chewing issues d/t teeth).   Respiratory: Positive for cough and shortness of breath.   Cardiovascular: Positive for chest pain (8/10 chest pain since lifting heavy pot) and palpitations.  Musculoskeletal: Positive for back pain.  Neurological: Positive for numbness (fingers).  Psychiatric/Behavioral: Positive for sleep disturbance.  All other systems reviewed and are negative.   PAST MEDICAL/SURGICAL HISTORY:  Past Medical History:  Diagnosis Date   Allergic rhinitis    Anxiety    Cataract    COPD (chronic obstructive pulmonary disease) (HCC)    Depression    DeQuervain's disease (tenosynovitis)     Hyperlipidemia    Hypertension    Irritable bowel syndrome with constipation    Knee fracture    Low back pain    Migraines    Osteoarthritis    Peptic ulcer disease    PVD (peripheral vascular disease) ([place])    Right rotator cuff tear    Past Surgical History:  Procedure Laterality Date   back tumor     BIOPSY  08/31/2019   Procedure: BIOPSY;  Surgeon: Harvel Quale, MD;  Location: [facility];  Service: Gastroenterology;;   breast tumor     CATARACT EXTRACTION W/PHACO Right 03/13/2012   Procedure: CATARACT EXTRACTION PHACO AND INTRAOCULAR LENS PLACEMENT ([place]);  Surgeon: Tonny Branch, MD;  Location: [facility];  Service: Ophthalmology;  Laterality: Right;  CDE:15.51   CATARACT EXTRACTION W/PHACO Left 03/27/2012   Procedure: CATARACT EXTRACTION PHACO AND INTRAOCULAR LENS PLACEMENT ([facility]);  Surgeon: Tonny Branch, MD;  Location: [facility];  Service: Ophthalmology;  Laterality: Left;  CDE=10.91   cataract surgery     COLONOSCOPY  12/08/2011   Procedure: COLONOSCOPY;  Surgeon: Daneil Dolin, MD;  Location: [facility];  Service: Endoscopy;  Laterality: N/A;  11:15 AM   COLONOSCOPY WITH PROPOFOL N/A 08/31/2019   Procedure: COLONOSCOPY WITH PROPOFOL;  Surgeon: Harvel Quale, MD;  [facility]: [facility];  Service: Gastroenterology;  Laterality: N/A;  815   CYST EXCISION Right July 2015   Right Preauricular area   ESOPHAGOGASTRODUODENOSCOPY (EGD) WITH PROPOFOL N/A 08/31/2019   Procedure: ESOPHAGOGASTRODUODENOSCOPY (EGD) WITH  PROPOFOL;  Surgeon: Montez Morita, Quillian Quince, MD;  Location: [facility];  Service: Gastroenterology;  Laterality: N/A;   PR VEIN BYPASS GRAFT,AORTO-FEM-POP     right ankle otif     right femoral to above the knee popliteal bypass     right femur fracture     right inguinal herniorraphy      SOCIAL HISTORY:  Social History   Socioeconomic History   Marital status: Single    Spouse name: Not on file   Number of  children: 0   Years of education: Not on file   Highest education level: Not on file  Occupational History   Not on file  Tobacco Use   Smoking status: Current Every Day Smoker    Packs/day: 1.00    Years: 52.00    Pack years: 52.00    Types: Cigarettes   Smokeless tobacco: Never Used   Tobacco comment: pt states she smokes about 3-4 cigarettes a day  Vaping Use   Vaping Use: Never assessed  Substance and Sexual Activity   Alcohol use: No    Comment: she quit 1 year ago   Drug use: No   Sexual activity: Not on file  Other Topics Concern   Not on file  Social History Narrative   Not on file   Social Determinants of Health   Financial Resource Strain: Low Risk    Difficulty of Paying Living Expenses: Not very hard  Food Insecurity: No Food Insecurity   Worried About Running Out of Food in the Last Year: Never true   Ran Out of Food in the Last Year: Never true  Transportation Needs: No Transportation Needs   Lack of Transportation (Medical): No   Lack of Transportation (Non-Medical): No  Physical Activity: Inactive   Days of Exercise per Week: 0 days   Minutes of Exercise per Session: 0 min  Stress: Stress Concern Present   Feeling of Stress : To some extent  Social Connections: Moderately Isolated   Frequency of Communication with Friends and Family: Three times a week   Frequency of Social Gatherings with Friends and Family: Three times a week   Attends Religious Services: More than 4 times per year   Active Member of Clubs or Organizations: No   Attends Archivist Meetings: Never   Marital Status: Never married  Human resources officer Violence: Not At Risk   Fear of Current or Ex-Partner: No   Emotionally Abused: No   Physically Abused: No   Sexually Abused: No    FAMILY HISTORY:  Family History  Problem Relation Age of Onset   Hypertension Mother    Heart attack Mother    Stroke Mother    Colon cancer Neg Hx      CURRENT MEDICATIONS:  Current Outpatient Medications  Medication Sig Dispense Refill   acetaminophen (TYLENOL) 500 MG tablet Take 1 tablet (500 mg total) by mouth every 6 (six) hours as needed. 30 tablet 0   albuterol (VENTOLIN HFA) 108 (90 Base) MCG/ACT inhaler Inhale into the lungs.     amLODipine (NORVASC) 10 MG tablet Take 10 mg by mouth daily.     cilostazol (PLETAL) 100 MG tablet Take 100 mg by mouth 2 (two) times daily.      fluticasone (FLONASE) 50 MCG/ACT nasal spray Place 1 spray into both nostrils daily.     gabapentin (NEURONTIN) 100 MG capsule Take 200 mg by mouth at bedtime.      losartan (COZAAR) 50 MG tablet Take  50 mg by mouth daily.      mirtazapine (REMERON) 15 MG tablet Take 15 mg by mouth at bedtime.       oxybutynin (DITROPAN-XL) 10 MG 24 hr tablet Take 10 mg by mouth at bedtime.     risedronate (ACTONEL) 35 MG tablet Take 35 mg by mouth every Thursday. with water on empty stomach, nothing by mouth or lie down for next 30 minutes.     simvastatin (ZOCOR) 20 MG tablet Take 20 mg by mouth at bedtime.       traMADol (ULTRAM) 50 MG tablet Take 50 mg by mouth 2 (two) times daily as needed.      No current facility-administered medications for this visit.   Facility-Administered Medications Ordered in Other Visits  Medication Dose Route Frequency Provider Last Rate Last Admin   fentaNYL (SUBLIMAZE) injection 25-50 mcg  25-50 mcg Intravenous Q5 min PRN Lerry Liner, MD        ALLERGIES:  Allergies  Allergen Reactions   Aspirin Other (See Comments)    stomach upset    PHYSICAL EXAM:  Performance status (ECOG): 1 - Symptomatic but completely ambulatory  Vitals:   10/08/19 1159  BP: 133/82  Pulse: (!) 124  Resp: 19  Temp: (!) 96.8 F (36 C)  SpO2: 100%   Wt Readings from Last 3 Encounters:  10/08/19 77 lb 3.2 oz (35 kg)  09/20/19 80 lb 6.4 oz (36.5 kg)  09/10/19 79 lb 8 oz (36.1 kg)   Physical Exam Vitals reviewed.  Constitutional:       Appearance: Normal appearance.  Cardiovascular:     Rate and Rhythm: Normal rate and regular rhythm.     Pulses: Normal pulses.     Heart sounds: Normal heart sounds.  Pulmonary:     Effort: Pulmonary effort is normal.     Breath sounds: Normal breath sounds.  Chest:     Chest wall: Tenderness (TTP over sternum) present.  Musculoskeletal:     Thoracic back: Tenderness (TTP around mid-thoracic spine) present.  Lymphadenopathy:     Cervical: No cervical adenopathy.     Upper Body:     Right upper body: No supraclavicular, axillary or pectoral adenopathy.     Left upper body: Axillary adenopathy (1.5 cm) present. No supraclavicular or pectoral adenopathy.  Neurological:     General: No focal deficit present.     Mental Status: She is alert and oriented to person, place, and time.  Psychiatric:        Mood and Affect: Mood normal.        Behavior: Behavior normal.      LABORATORY DATA:  I have reviewed the labs as listed.  CBC Latest Ref Rng & Units 09/20/2019 08/09/2019 03/08/2012  WBC 4.0 - 10.5 K/uL 12.4(H) 11.2(H) -  Hemoglobin 12.0 - 15.0 g/dL 10.3(L) 13.1 12.9  Hematocrit 36 - 46 % 34.6(L) 40.7 39.0  Platelets 150 - 400 K/uL 478(H) 435(H) -   CMP Latest Ref Rng & Units 09/20/2019 08/09/2019 03/08/2012  Glucose 70 - 99 mg/dL 102(H) 118(H) 108(H)  BUN 8 - 23 mg/dL 30(H) 14 11  Creatinine 0.44 - 1.00 mg/dL 0.87 0.90 0.72  Sodium 135 - 145 mmol/L 135 137 140  Potassium 3.5 - 5.1 mmol/L 4.2 4.6 4.3  Chloride 98 - 111 mmol/L 99 99 104  CO2 22 - 32 mmol/L 23 26 28   Calcium 8.9 - 10.3 mg/dL 9.3 10.0 9.8  Total Protein 6.5 - 8.1 g/dL 7.9 7.8 -  Total Bilirubin 0.3 - 1.2 mg/dL 0.4 0.5 -  Alkaline Phos 38 - 126 U/L 79 - -  AST 15 - 41 U/L 12(L) 8(L) -  ALT 0 - 44 U/L 8 3(L) -   Lab Results  Component Value Date   LDH 314 (H) 09/20/2019   Lab Results  Component Value Date   TOTALPROTELP 7.1 09/20/2019   ALBUMINELP 3.0 09/20/2019   A1GS 0.6 (H) 09/20/2019   A2GS 1.1 (H)  09/20/2019   BETS 1.2 09/20/2019   GAMS 1.2 09/20/2019   MSPIKE Not Observed 09/20/2019   SPEI Comment 09/20/2019    DIAGNOSTIC IMAGING:  I have independently reviewed the scans and discussed with the patient. NM PET Image Initial (PI) Skull Base To Thigh  Result Date: 10/02/2019 CLINICAL DATA:  Initial treatment strategy for lung nodules. EXAM: NUCLEAR MEDICINE PET SKULL BASE TO THIGH TECHNIQUE: 6.85 mCi F-18 FDG was injected intravenously. Full-ring PET imaging was performed from the skull base to thigh after the radiotracer. CT data was obtained and used for attenuation correction and anatomic localization. Fasting blood glucose: 129 mg/dl COMPARISON:  09/04/2019 FINDINGS: Mediastinal blood pool activity: SUV max 1.73 Liver activity: SUV max NA NECK: High left posterior cervical lymph node is FDG avid. This measures 0.8 cm within SUV max of 5.69. Just above the epiglottis there is a left paralaryngeal soft tissue nodule which is FDG avid within SUV max of 7.4. Incidental CT findings: None the CHEST: Enlarged left retropectoral and left axillary lymph nodes are identified. Index lymph node measures 1.2 cm with SUV max of 3.12, Bilateral FDG avid mediastinal lymph nodes are identified. Index left pre vascular node measures 2 cm with SUV max of 6.13. Index right paratracheal lymph node measures 0.9 cm with SUV max of 14.9. Numerous small lung nodules are identified scattered throughout both lungs. Most of these are too small to reliably characterize. For example, within the right upper lobe there is a 5 mm lung nodule with SUV max of 0.9, image 59/3. Index nodule within the lingula measures 0.8 cm within SUV max of 1.57. Subpleural nodule in the posterior left lower lobe measures 0.7 cm and has an SUV max of 3.17. Incidental CT findings: Mild cardiac enlargement. No significant pericardial effusion. Aortic atherosclerosis with coronary artery calcifications. Mild changes of centrilobular emphysema.  Bibasilar scarring/platelike atelectasis noted. ABDOMEN/PELVIS: Within the lateral aspect of the right hepatic lobe there is a solitary FDG avid hypodense lesion measuring 1.2 cm with SUV max of 9.12, image 119/3. Low-density structure within the anterior right hepatic lobe measuring 1 cm is noted without corresponding FDG uptake and is favored to represent a small liver cyst. Bilateral nephromegaly with numerous renal cysts. The complex, solid enhancing lesion arising from the anterior cortex of the inferior right kidney as characterized on recent CT is intensely FDG avid within SUV max of 13.2. There is extensive FDG avid retroperitoneal nodal metastasis. Conglomeration of retroperitoneal lymph nodes at the level of the right renal hilum measures 8.7 x 5.2 cm and has an SUV max of 8.85. Incidental CT findings: Aortic atherosclerosis. No aneurysm identified. SKELETON: Extensive FDG avid bone metastases are identified involving the proximal appendicular skeleton and axial skeleton. These lesions are too numerous to count. Index lesion within the sternal manubrium has an SUV max of 11.84 without definite corresponding changes on the CT images. Similarly, there is an FDG avid lesion within the distal body of sternum within SUV max of 9.9. Numerous FDG avid lesions are identified  involving the cervical, thoracic and lumbar spine. Index lesion involving the T7 vertebra has an SUV max of 13.15. Within the L1 vertebra there is a large focus of increased tracer uptake involving the vertebral body and posterior elements. This has an SUV max of 16.39. FDG avid lesion within the right posterior iliac bone has an SUV max of 8.11. Within the left acetabulum there is a large FDG avid lesion within SUV max of 6.92. Sclerotic changes are noted on the corresponding CT images measuring approximately 3.1 cm, image 1099/3. Within the lesser trochanter of the proximal left femur there is a small focus of increased uptake which has an  SUV max of 5.52. FDG avid bilateral rib lesions are also noted including the posterior left first rib which has an SUV max of 4.49. Multiple bilateral FDG avid scapular lesions are also noted including a small lesion in the spine of the left scapula with SUV max of 6.23. Incidental CT findings: Again seen are compression deformities within the T9 T11 and T3 vertebra. IMPRESSION: 1. There is intense FDG uptake associated with the in solid enhancing mass involving the inferior pole of right kidney concerning for primary renal cell carcinoma. 2. Bulky FDG avid retroperitoneal adenopathy is identified within the retroperitoneum which encases and up lifts the retroperitoneal structures. FDG avid lymph nodes are also identified within the left cervical nodal chain and left axilla and left retropectoral region as well as the mediastinum. 3. Innumerable FDG avid bone lesions are identified. Most of these do not exhibit any significant lytic or sclerotic changes on corresponding CT images. 4. Solitary FDG avid right hepatic lobe liver metastasis. 5. Innumerable small (mostly less than 1 cm) pulmonary nodules are identified which are too small to reliably characterize by PET-CT but are highly suspicious for foci of metastasis. 6. Contrast enhanced MRI of the brain and spine may be helpful to assess for CNS involvement by tumor. 7. Aortic Atherosclerosis (ICD10-I70.0) and Emphysema (ICD10-J43.9). Coronary artery calcifications noted. Electronically Signed   By: Kerby Moors M.D.   On: 10/02/2019 11:35     ASSESSMENT:  1.  Right kidney mass: -Evaluation for weight loss with CT CAP on 09/04/2019 showed 5 cm right kidney mass, pathologically enlarged prevascular lymph nodes in the chest, enlarged left axillary lymph node, central mesenteric lymph node, adenopathy surrounding the abdominal aorta and numerous small bilateral pulmonary nodules.  2.  Weight loss: -30 to 40 pound weight loss since January of this year.   Initially had vomiting which subsided at this time.  She does report occasional night sweats. -Colonoscopy on 08/31/2019 shows diverticulosis in the sigmoid colon, descending colon and and ascending colon.  2 nonbleeding colonic angiodysplastic lesions.  One 2 millimeter polyp in the sigmoid colon. -EGD on 08/31/2019 shows 3 cm hiatal hernia, normal stomach, duodenum.  3.  Social/family history: -Quit smoking 2 and half months ago.  She lives alone with her dog. -Smoked 1 to 2 packs/day for 60 years. -No family history of malignancies.   PLAN:  1.  Metastatic cancer: -We have reviewed her labs today.  LDH is elevated at 314. -White count is up at 12.4, predominantly neutrophils.  Likely reactive from underlying malignancy. -Reviewed PET images with the patient and her friend.  Intense uptake in the right kidney mass, bulky retroperitoneal adenopathy.  Multiple bone lesions.  Solitary right hepatic lobe liver metastasis.  Left axillary lymph node with SUV around 3.1. -She has palpable right axillary adenopathy.  Neck adenopathy is not  palpable. -We will arrange for a biopsy of the left axillary lymph node. -She will come back after the biopsy to discuss results and further plan. -I will also obtain imaging of her brain with MRI.  2.  Weight loss: -She lost another 3 pounds since last visit. -She is having some diarrhea after she drinks boost/Ensure.  3.  Sternal and back pain: -She is taking tramadol 50 mg every 12 hours which is not controlling well. -I will start her on hydrocodone 5/325 every 8 hours as needed.   Orders placed this encounter:  Orders Placed This Encounter  Procedures   CT Biopsy     Derek Jack, MD [place] [phone]   I, Milinda Antis, am acting as a scribe for Dr. Sanda Linger.  I, Derek Jack MD, have reviewed the above documentation for accuracy and completeness, and I agree with the above.

## 2019-10-08 NOTE — Patient Instructions (Signed)
[place] at [facility] Discharge Instructions  You were seen today by Dr. Delton Coombes. He went over your recent results and scans. You will be referred to a general surgeon to have a lymph node biopsy. You will also be scheduled for an MRI scan of your brain. You will be prescribed Norco 5/325 to take every 8 hours for your pain; stop taking the tramadol. Dr. Delton Coombes will see you back after the biopsy for labs and follow up.   Thank you for choosing [place] at [facility] to provide your oncology and hematology care.  To afford each patient quality time with our provider, please arrive at least 15 minutes before your scheduled appointment time.   If you have a lab appointment with the [facility] please come in thru the Main Entrance and check in at the main information desk  You need to re-schedule your appointment should you arrive 10 or more minutes late.  We strive to give you quality time with our providers, and arriving late affects you and other patients whose appointments are after yours.  Also, if you no show three or more times for appointments you may be dismissed from the clinic at the providers discretion.     Again, thank you for choosing [facility].  Our hope is that these requests will decrease the amount of time that you wait before being seen by our physicians.       _____________________________________________________________  Should you have questions after your visit to [facility], please contact our office at [phone] between the hours of 8:00 a.m. and 4:30 p.m.  Voicemails left after 4:00 p.m. will not be returned until the following business day.  For prescription refill requests, have your pharmacy contact our office and allow 72 hours.    Cancer Center Support Programs:   > Cancer Support Group  2nd Tuesday of the month 1pm-2pm, Journey Room

## 2019-10-11 ENCOUNTER — Emergency Department (HOSPITAL_COMMUNITY)
Admission: EM | Admit: 2019-10-11 | Discharge: 2019-10-11 | Disposition: A | Discharge status: Home / Self Care | Payer: Medicare HMO | Attending: Emergency Medicine | Admitting: Emergency Medicine

## 2019-10-11 ENCOUNTER — Encounter: Payer: Self-pay | Admitting: General Surgery

## 2019-10-11 ENCOUNTER — Emergency Department (HOSPITAL_COMMUNITY): Payer: Medicare HMO

## 2019-10-11 ENCOUNTER — Other Ambulatory Visit: Payer: Self-pay

## 2019-10-11 ENCOUNTER — Ambulatory Visit (INDEPENDENT_AMBULATORY_CARE_PROVIDER_SITE_OTHER): Payer: Medicare HMO | Admitting: General Surgery

## 2019-10-11 ENCOUNTER — Encounter (HOSPITAL_COMMUNITY): Payer: Self-pay | Admitting: Emergency Medicine

## 2019-10-11 VITALS — BP 146/103 | HR 133 | Temp 97.6°F | Resp 18 | Ht 61.0 in | Wt 77.0 lb

## 2019-10-11 DIAGNOSIS — C7951 Secondary malignant neoplasm of bone: Secondary | ICD-10-CM | POA: Diagnosis not present

## 2019-10-11 DIAGNOSIS — I10 Essential (primary) hypertension: Secondary | ICD-10-CM | POA: Diagnosis not present

## 2019-10-11 DIAGNOSIS — S79921A Unspecified injury of right thigh, initial encounter: Secondary | ICD-10-CM | POA: Diagnosis not present

## 2019-10-11 DIAGNOSIS — M85851 Other specified disorders of bone density and structure, right thigh: Secondary | ICD-10-CM | POA: Diagnosis not present

## 2019-10-11 DIAGNOSIS — R69 Illness, unspecified: Secondary | ICD-10-CM | POA: Diagnosis not present

## 2019-10-11 DIAGNOSIS — I700 Atherosclerosis of aorta: Secondary | ICD-10-CM | POA: Diagnosis not present

## 2019-10-11 DIAGNOSIS — R531 Weakness: Secondary | ICD-10-CM | POA: Insufficient documentation

## 2019-10-11 DIAGNOSIS — J449 Chronic obstructive pulmonary disease, unspecified: Secondary | ICD-10-CM | POA: Diagnosis not present

## 2019-10-11 DIAGNOSIS — Z7951 Long term (current) use of inhaled steroids: Secondary | ICD-10-CM | POA: Insufficient documentation

## 2019-10-11 DIAGNOSIS — D72829 Elevated white blood cell count, unspecified: Secondary | ICD-10-CM

## 2019-10-11 DIAGNOSIS — M25551 Pain in right hip: Secondary | ICD-10-CM | POA: Diagnosis not present

## 2019-10-11 DIAGNOSIS — M47816 Spondylosis without myelopathy or radiculopathy, lumbar region: Secondary | ICD-10-CM | POA: Diagnosis not present

## 2019-10-11 DIAGNOSIS — C799 Secondary malignant neoplasm of unspecified site: Secondary | ICD-10-CM

## 2019-10-11 DIAGNOSIS — Z20822 Contact with and (suspected) exposure to covid-19: Secondary | ICD-10-CM | POA: Diagnosis not present

## 2019-10-11 DIAGNOSIS — I7090 Unspecified atherosclerosis: Secondary | ICD-10-CM | POA: Diagnosis not present

## 2019-10-11 DIAGNOSIS — R634 Abnormal weight loss: Secondary | ICD-10-CM | POA: Diagnosis not present

## 2019-10-11 DIAGNOSIS — R590 Localized enlarged lymph nodes: Secondary | ICD-10-CM

## 2019-10-11 DIAGNOSIS — C641 Malignant neoplasm of right kidney, except renal pelvis: Secondary | ICD-10-CM | POA: Diagnosis not present

## 2019-10-11 DIAGNOSIS — I70219 Atherosclerosis of native arteries of extremities with intermittent claudication, unspecified extremity: Secondary | ICD-10-CM | POA: Insufficient documentation

## 2019-10-11 DIAGNOSIS — R000 Tachycardia, unspecified: Secondary | ICD-10-CM | POA: Insufficient documentation

## 2019-10-11 DIAGNOSIS — F17210 Nicotine dependence, cigarettes, uncomplicated: Secondary | ICD-10-CM | POA: Insufficient documentation

## 2019-10-11 DIAGNOSIS — S2231XA Fracture of one rib, right side, initial encounter for closed fracture: Secondary | ICD-10-CM | POA: Diagnosis not present

## 2019-10-11 DIAGNOSIS — I495 Sick sinus syndrome: Secondary | ICD-10-CM | POA: Diagnosis not present

## 2019-10-11 DIAGNOSIS — M79651 Pain in right thigh: Secondary | ICD-10-CM | POA: Diagnosis not present

## 2019-10-11 LAB — COMPREHENSIVE METABOLIC PANEL
ALT: 8 U/L (ref 0–44)
AST: 11 U/L — ABNORMAL LOW (ref 15–41)
Albumin: 2.9 g/dL — ABNORMAL LOW (ref 3.5–5.0)
Alkaline Phosphatase: 95 U/L (ref 38–126)
Anion gap: 11 (ref 5–15)
BUN: 16 mg/dL (ref 8–23)
CO2: 25 mmol/L (ref 22–32)
Calcium: 9.1 mg/dL (ref 8.9–10.3)
Chloride: 99 mmol/L (ref 98–111)
Creatinine, Ser: 0.82 mg/dL (ref 0.44–1.00)
GFR calc Af Amer: >60 mL/min (ref >60)
GFR calc non Af Amer: >60 mL/min (ref >60)
Glucose, Bld: 119 mg/dL — ABNORMAL HIGH (ref 70–99)
Potassium: 4.1 mmol/L (ref 3.5–5.1)
Sodium: 135 mmol/L (ref 135–145)
Total Bilirubin: 0.5 mg/dL (ref 0.3–1.2)
Total Protein: 7.2 g/dL (ref 6.5–8.1)

## 2019-10-11 LAB — URINALYSIS, ROUTINE W REFLEX MICROSCOPIC
Bilirubin Urine: NEGATIVE
Glucose, UA: NEGATIVE mg/dL
Hgb urine dipstick: NEGATIVE
Ketones, ur: NEGATIVE mg/dL
Leukocytes,Ua: NEGATIVE
Nitrite: NEGATIVE
Protein, ur: NEGATIVE mg/dL
Specific Gravity, Urine: 1.023 (ref 1.005–1.030)
pH: 5 (ref 5.0–8.0)

## 2019-10-11 LAB — CBC WITH DIFFERENTIAL/PLATELET
Abs Immature Granulocytes: 0.07 10*3/uL (ref 0.00–0.07)
Basophils Absolute: 0 10*3/uL (ref 0.0–0.1)
Basophils Relative: 0 %
Eosinophils Absolute: 0 10*3/uL (ref 0.0–0.5)
Eosinophils Relative: 0 %
HCT: 33.2 % — ABNORMAL LOW (ref 36.0–46.0)
Hemoglobin: 10.2 g/dL — ABNORMAL LOW (ref 12.0–15.0)
Immature Granulocytes: 0 %
Lymphocytes Relative: 8 %
Lymphs Abs: 1.3 10*3/uL (ref 0.7–4.0)
MCH: 24.5 pg — ABNORMAL LOW (ref 26.0–34.0)
MCHC: 30.7 g/dL (ref 30.0–36.0)
MCV: 79.8 fL — ABNORMAL LOW (ref 80.0–100.0)
Monocytes Absolute: 1 10*3/uL (ref 0.1–1.0)
Monocytes Relative: 6 %
Neutro Abs: 13.3 10*3/uL — ABNORMAL HIGH (ref 1.7–7.7)
Neutrophils Relative %: 86 %
Platelets: 536 10*3/uL — ABNORMAL HIGH (ref 150–400)
RBC: 4.16 MIL/uL (ref 3.87–5.11)
RDW: 16.8 % — ABNORMAL HIGH (ref 11.5–15.5)
WBC: 15.7 10*3/uL — ABNORMAL HIGH (ref 4.0–10.5)
nRBC: 0 % (ref 0.0–0.2)

## 2019-10-11 LAB — TROPONIN I (HIGH SENSITIVITY)
Troponin I (High Sensitivity): 4 ng/L (ref <18)
Troponin I (High Sensitivity): 5 ng/L (ref <18)

## 2019-10-11 LAB — RESPIRATORY PANEL BY RT PCR (FLU A&B, COVID)
Influenza A by PCR: NEGATIVE
Influenza B by PCR: NEGATIVE
SARS Coronavirus 2 by RT PCR: NEGATIVE

## 2019-10-11 LAB — LACTIC ACID, PLASMA: Lactic Acid, Venous: 1.7 mmol/L (ref 0.5–1.9)

## 2019-10-11 MED ORDER — AMOXICILLIN-POT CLAVULANATE 875-125 MG PO TABS: 1.0000 | ORAL_TABLET | Freq: Two times a day (BID) | ORAL | 0 refills | Status: DC

## 2019-10-11 MED ORDER — IOHEXOL 350 MG/ML SOLN
60.0000 mL | Freq: Once | INTRAVENOUS | Status: AC | PRN
  Administered 2019-10-11: 60 mL via INTRAVENOUS

## 2019-10-11 MED ORDER — SODIUM CHLORIDE 0.9 % IV BOLUS
500.0000 mL | Freq: Once | INTRAVENOUS | Status: AC
  Administered 2019-10-11: 500 mL via INTRAVENOUS

## 2019-10-11 MED ORDER — SODIUM CHLORIDE 0.9 % IV BOLUS
1000.0000 mL | Freq: Once | INTRAVENOUS | Status: AC
  Administered 2019-10-11: 1000 mL via INTRAVENOUS

## 2019-10-11 MED ORDER — AMOXICILLIN-POT CLAVULANATE 500-125 MG PO TABS
1.0000 | ORAL_TABLET | Freq: Once | ORAL | Status: AC
  Administered 2019-10-11: 500 mg via ORAL
  Filled 2019-10-11: qty 1

## 2019-10-11 NOTE — ED Notes (Signed)
Entered room and introduced self to patient. Pt in hospital gown with continuous cardiac monitoring in place. Bed is locked in the lowest position, side rails x2, call bell within reach. Pt provided with a warm blanket per request and television to provide entertainment. All questions and concerns voiced addressed by this RN. Educated on rounding at this time, pt in agreement and verbalized understanding.

## 2019-10-11 NOTE — ED Notes (Signed)
Venous sample collected by this RN, patient information validated using two patient identifiers, sample labeled and walked to lab by this RN.

## 2019-10-11 NOTE — Progress Notes (Signed)
Alicia [name]; [id number]; 01/11/1941   HPI Patient is a 79 year old black female who was referred to my care by Dr. Delton Coombes of oncology for left axillary lymph node biopsy.  She was recently found to have a renal mass and diffuse lymphadenopathy.  She has been referred for a lymph node biopsy to assess metastatic disease.  She states is over the past few days, she has had increasing generalized pain, especially along the right chest wall.  She has been feeling weaker over the past few days.  She denies any fever or chills. Past Medical History:  Diagnosis Date  . Allergic rhinitis   . Anxiety   . Cataract   . COPD (chronic obstructive pulmonary disease) ([place])   . Depression   . DeQuervain's disease (tenosynovitis)   . Hyperlipidemia   . Hypertension   . Irritable bowel syndrome with constipation   . Knee fracture   . Low back pain   . Migraines   . Osteoarthritis   . Peptic ulcer disease   . PVD (peripheral vascular disease) ([place])   . Right rotator cuff tear     Past Surgical History:  Procedure Laterality Date  . back tumor    . BIOPSY  08/31/2019   Procedure: BIOPSY;  Surgeon: Harvel Quale, MD;  [facility];  Service: Gastroenterology;;  . breast tumor    . CATARACT EXTRACTION W/PHACO Right 03/13/2012   Procedure: CATARACT EXTRACTION PHACO AND INTRAOCULAR LENS PLACEMENT (IOC);  Surgeon: Tonny Branch, MD;  Location: [facility];  Service: Ophthalmology;  Laterality: Right;  CDE:15.51  . CATARACT EXTRACTION W/PHACO Left 03/27/2012   Procedure: CATARACT EXTRACTION PHACO AND INTRAOCULAR LENS PLACEMENT (IOC);  Surgeon: Tonny Branch, MD;  Location: [facility];  Service: Ophthalmology;  Laterality: Left;  CDE=10.91  . cataract surgery    . COLONOSCOPY  12/08/2011   Procedure: COLONOSCOPY;  Surgeon: Daneil Dolin, MD;  [facility];  Service: Endoscopy;  Laterality: N/A;  11:15 AM  . COLONOSCOPY WITH PROPOFOL N/A 08/31/2019   Procedure: COLONOSCOPY WITH  PROPOFOL;  Surgeon: Harvel Quale, MD;  [facility];  Service: Gastroenterology;  Laterality: N/A;  815  . CYST EXCISION Right July 2015   Right Preauricular area  . ESOPHAGOGASTRODUODENOSCOPY (EGD) WITH PROPOFOL N/A 08/31/2019   Procedure: ESOPHAGOGASTRODUODENOSCOPY (EGD) WITH PROPOFOL;  Surgeon: Harvel Quale, MD;  [facility];  Service: Gastroenterology;  Laterality: N/A;  . PR VEIN BYPASS GRAFT,AORTO-FEM-POP    . right ankle otif    . right femoral to above the knee popliteal bypass    . right femur fracture    . right inguinal herniorraphy      Family History  Problem Relation Age of Onset  . Hypertension Mother   . Heart attack Mother   . Stroke Mother   . Colon cancer Neg Hx     Current Outpatient Medications on File Prior to Visit  Medication Sig Dispense Refill  . acetaminophen (TYLENOL) 500 MG tablet Take 1 tablet (500 mg total) by mouth every 6 (six) hours as needed. 30 tablet 0  . albuterol (VENTOLIN HFA) 108 (90 Base) MCG/ACT inhaler Inhale into the lungs.    Marland Kitchen amLODipine (NORVASC) 10 MG tablet Take 10 mg by mouth daily.    . cilostazol (PLETAL) 100 MG tablet Take 100 mg by mouth 2 (two) times daily.     . fluticasone (FLONASE) 50 MCG/ACT nasal spray Place 1 spray into both nostrils daily.    Marland Kitchen  gabapentin (NEURONTIN) 100 MG capsule Take 200 mg by mouth at bedtime.     Marland Kitchen HYDROcodone-acetaminophen (NORCO) 5-325 MG tablet Take 1 tablet by mouth every 8 (eight) hours as needed for moderate pain. 90 tablet 0  . losartan (COZAAR) 50 MG tablet Take 50 mg by mouth daily.     . mirtazapine (REMERON) 15 MG tablet Take 15 mg by mouth at bedtime.      Marland Kitchen oxybutynin (DITROPAN-XL) 10 MG 24 hr tablet Take 10 mg by mouth at bedtime.    . risedronate (ACTONEL) 35 MG tablet Take 35 mg by mouth every Thursday. with water on empty stomach, nothing by mouth or lie down for next 30 minutes.    . simvastatin (ZOCOR) 20 MG tablet Take 20 mg by mouth  at bedtime.      . traMADol (ULTRAM) 50 MG tablet Take 50 mg by mouth 2 (two) times daily as needed.      Current Facility-Administered Medications on File Prior to Visit  Medication Dose Route Frequency Provider Last Rate Last Admin  . fentaNYL (SUBLIMAZE) injection 25-50 mcg  25-50 mcg Intravenous Q5 min PRN Lerry Liner, MD        Allergies  Allergen Reactions  . Aspirin Other (See Comments)    stomach upset    Social History   Substance and Sexual Activity  Alcohol Use No   Comment: she quit 1 year ago    Social History   Tobacco Use  Smoking Status Current Every Day Smoker  . Packs/day: 1.00  . Years: 52.00  . Pack years: 52.00  . Types: Cigarettes  Smokeless Tobacco Never Used  Tobacco Comment   pt states she smokes about 3-4 cigarettes a day    Review of Systems  Unable to perform ROS: Severity of pain    Objective   Vitals:   10/11/19 0956  BP: (!) 146/103  Pulse: (!) 133  Resp: 18  Temp: 97.6 F (36.4 C)  SpO2: 92%    Physical Exam Vitals reviewed.  Constitutional:      General: She is in acute distress.     Appearance: Normal appearance.     Comments: Patient is anxious secondary to pain and weakness.  She had to be placed in a wheelchair to help her ambulate.  HENT:     Head: Normocephalic and atraumatic.  Cardiovascular:     Rate and Rhythm: Tachycardia present.     Heart sounds: Normal heart sounds. No murmur heard.  No friction rub. No gallop.   Pulmonary:     Effort: Pulmonary effort is normal. No respiratory distress.     Breath sounds: Normal breath sounds. No stridor. No wheezing, rhonchi or rales.  Skin:    General: Skin is warm and dry.  Neurological:     Mental Status: She is alert and oriented to person, place, and time.   Left axilla with easily palpable mobile lymph node. Oncology notes reviewed Assessment  Renal mass with generalized lymphadenopathy Generalized pain with tachycardia to 130 and hypertension Plan    As she seemed acutely ill with a tachycardic rhythm and shortness of breath, she was sent to Dr. Josephine Cables office for further evaluation and treatment.  Once this has been stabilized, I will schedule her for a left axillary lymph node biopsy.  The risks and benefits of the procedure were fully explained to the patient, who gave informed consent.

## 2019-10-11 NOTE — ED Provider Notes (Signed)
[facility] EMERGENCY DEPARTMENT Provider Note   CSN: [id number] Arrival date & time: 10/11/19  1207     History Chief Complaint  Patient presents with  . Tachycardia    Alicia [name] is a 79 y.o. female.  HPI   Patient states she has been diagnosed with bone cancer.  She is currently in the process of what sounds like staging procedures determine treatment.  Medical records indicate the patient was found to have a renal mass recently and noted to have diffuse lymphadenopathy.  Patient went to Dr. Arnoldo Morale office today to arrange for axillary lymph node biopsy.  While at the office she was noted to be rather tachycardic.  Patient denies any trouble with chest pain or shortness of breath.  She does feel overall weak but has not noticed anything different with that recently.  She denies any vomiting or diarrhea.  Because of her tachycardia she was sent to her primary care doctor's office.  She was then sent from there to here for further evaluation  Past Medical History:  Diagnosis Date  . Allergic rhinitis   . Anxiety   . Cataract   . COPD (chronic obstructive pulmonary disease) ([place])   . Depression   . DeQuervain's disease (tenosynovitis)   . Hyperlipidemia   . Hypertension   . Irritable bowel syndrome with constipation   . Knee fracture   . Low back pain   . Migraines   . Osteoarthritis   . Peptic ulcer disease   . PVD (peripheral vascular disease) ([place])   . Right rotator cuff tear     Patient Active Problem List   Diagnosis Date Noted  . Renal mass 09/10/2019  . Lung nodules 09/10/2019  . Loss of weight 08/09/2019  . Early satiety 08/09/2019  . Lymphadenopathy, axillary 08/09/2019  . Fever, unspecified 07/26/2019  . Sensation of feeling cold- Bilat foot 12/03/2013  . Atherosclerosis of native arteries of the extremities with intermittent claudication 01/25/2011  . Shoulder pain, bilateral 01/04/2011  . LIPOMA 01/06/2010  . UNSPECIFIED CYST OF BONE 12/25/2009  .  CONTRACTURE OF ANKLE AND FOOT JOINT 08/07/2009  . DISRUPTION OF INTERNAL OPERATION SURGICAL WOUND 05/22/2009  . CLOSED BIMALLEOLAR FRACTURE 02/12/2009  . RUPTURE ROTATOR CUFF 10/03/2008  . TOBACCO ABUSE 04/09/2008  . Pain in joint, shoulder region 04/09/2008  . CLAUDICATION, INTERMITTENT 07/11/2007  . FOOT PAIN, BILATERAL 07/11/2007  . OSTEOARTHRITIS 01/02/2007  . Nonspecific (abnormal) findings on radiological and other examination of body structure 08/03/2006  . ABNORMAL CHEST XRAY 08/03/2006  . HYPERLIPIDEMIA 06/07/2006  . ANXIETY 06/07/2006  . DEPRESSION 06/07/2006  . HYPERTENSION 06/07/2006  . ALLERGIC RHINITIS 06/07/2006  . PEPTIC ULCER DISEASE 06/07/2006  . LOW BACK PAIN 06/07/2006    Past Surgical History:  Procedure Laterality Date  . back tumor    . BIOPSY  08/31/2019   Procedure: BIOPSY;  Surgeon: Harvel Quale, MD;  [facility];  Service: Gastroenterology;;  . breast tumor    . CATARACT EXTRACTION W/PHACO Right 03/13/2012   Procedure: CATARACT EXTRACTION PHACO AND INTRAOCULAR LENS PLACEMENT (IOC);  Surgeon: Tonny Branch, MD;  Location: [facility];  Service: Ophthalmology;  Laterality: Right;  CDE:15.51  . CATARACT EXTRACTION W/PHACO Left 03/27/2012   Procedure: CATARACT EXTRACTION PHACO AND INTRAOCULAR LENS PLACEMENT (IOC);  Surgeon: Tonny Branch, MD;  Location: [facility];  Service: Ophthalmology;  Laterality: Left;  CDE=10.91  . cataract surgery    . COLONOSCOPY  12/08/2011   Procedure: COLONOSCOPY;  Surgeon: Cristopher Estimable  Rourk, MD;  [facility];  Service: Endoscopy;  Laterality: N/A;  11:15 AM  . COLONOSCOPY WITH PROPOFOL N/A 08/31/2019   Procedure: COLONOSCOPY WITH PROPOFOL;  Surgeon: Harvel Quale, MD;  [facility];  Service: Gastroenterology;  Laterality: N/A;  815  . CYST EXCISION Right July 2015   Right Preauricular area  . ESOPHAGOGASTRODUODENOSCOPY (EGD) WITH PROPOFOL N/A 08/31/2019   Procedure:  ESOPHAGOGASTRODUODENOSCOPY (EGD) WITH PROPOFOL;  Surgeon: Harvel Quale, MD;  [facility];  Service: Gastroenterology;  Laterality: N/A;  . PR VEIN BYPASS GRAFT,AORTO-FEM-POP    . right ankle otif    . right femoral to above the knee popliteal bypass    . right femur fracture    . right inguinal herniorraphy       OB History   No obstetric history on file.     Family History  Problem Relation Age of Onset  . Hypertension Mother   . Heart attack Mother   . Stroke Mother   . Colon cancer Neg Hx     Social History   Tobacco Use  . Smoking status: Current Every Day Smoker    Packs/day: 1.00    Years: 52.00    Pack years: 52.00    Types: Cigarettes  . Smokeless tobacco: Never Used  . Tobacco comment: pt states she smokes about 3-4 cigarettes a day  Vaping Use  . Vaping Use: Never assessed  Substance Use Topics  . Alcohol use: No    Comment: she quit 1 year ago  . Drug use: No    Home Medications Prior to Admission medications   Medication Sig Start Date End Date Taking? Authorizing Provider  acetaminophen (TYLENOL) 500 MG tablet Take 1 tablet (500 mg total) by mouth every 6 (six) hours as needed. 07/26/19   Avegno, Darrelyn Hillock, FNP  albuterol (VENTOLIN HFA) 108 (90 Base) MCG/ACT inhaler Inhale into the lungs. 08/16/19   [provider]  amLODipine (NORVASC) 10 MG tablet Take 10 mg by mouth daily.    [provider]  cilostazol (PLETAL) 100 MG tablet Take 100 mg by mouth 2 (two) times daily.     [provider]  fluticasone (FLONASE) 50 MCG/ACT nasal spray Place 1 spray into both nostrils daily. 08/16/19   [provider]  gabapentin (NEURONTIN) 100 MG capsule Take 200 mg by mouth at bedtime.     [provider]  HYDROcodone-acetaminophen (NORCO) 5-325 MG tablet Take 1 tablet by mouth every 8 (eight) hours as needed for moderate pain. 10/08/19   Derek Jack, MD  losartan (COZAAR) 50 MG tablet Take 50 mg  by mouth daily.     [provider]  mirtazapine (REMERON) 15 MG tablet Take 15 mg by mouth at bedtime.      [provider]  oxybutynin (DITROPAN-XL) 10 MG 24 hr tablet Take 10 mg by mouth at bedtime.    [provider]  risedronate (ACTONEL) 35 MG tablet Take 35 mg by mouth every Thursday. with water on empty stomach, nothing by mouth or lie down for next 30 minutes.    [provider]  simvastatin (ZOCOR) 20 MG tablet Take 20 mg by mouth at bedtime.      [provider]  traMADol (ULTRAM) 50 MG tablet Take 50 mg by mouth 2 (two) times daily as needed.  05/11/19   [provider]    Allergies    Aspirin  Review of Systems   Review of  Systems  All other systems reviewed and are negative.   Physical Exam Updated Vital Signs BP (!) 157/97 (BP Location: Right Arm)   Pulse (!) 114   Temp 100 F (37.8 C) (Oral)   Resp 18   Ht 1.549 m (5\' 1" )   Wt 34 kg   SpO2 97%   BMI 14.16 kg/m   Physical Exam Vitals and nursing note reviewed.  Constitutional:      Appearance: She is well-developed.     Comments: Frail, under weight, malnourished  HENT:     Head: Normocephalic and atraumatic.     Right Ear: External ear normal.     Left Ear: External ear normal.  Eyes:     General: No scleral icterus.       Right eye: No discharge.        Left eye: No discharge.     Conjunctiva/sclera: Conjunctivae normal.  Neck:     Trachea: No tracheal deviation.  Cardiovascular:     Rate and Rhythm: Regular rhythm. Tachycardia present.  Pulmonary:     Effort: Pulmonary effort is normal. No respiratory distress.     Breath sounds: Normal breath sounds. No stridor. No wheezing or rales.  Abdominal:     General: Bowel sounds are normal. There is no distension.     Palpations: Abdomen is soft.     Tenderness: There is no abdominal tenderness. There is no guarding or rebound.  Musculoskeletal:        General: No tenderness.     Cervical back:  Neck supple.  Skin:    General: Skin is warm and dry.     Findings: No rash.  Neurological:     Mental Status: She is alert.     Cranial Nerves: No cranial nerve deficit (no facial droop, extraocular movements intact, no slurred speech).     Sensory: No sensory deficit.     Motor: No abnormal muscle tone or seizure activity.     Coordination: Coordination normal.     ED Results / Procedures / Treatments   Labs (all labs ordered are listed, but only abnormal results are displayed) Labs Reviewed  CBC WITH DIFFERENTIAL/PLATELET - Abnormal; Notable for the following components:      Result Value   WBC 15.7 (*)    Hemoglobin 10.2 (*)    HCT 33.2 (*)    MCV 79.8 (*)    MCH 24.5 (*)    RDW 16.8 (*)    Platelets 536 (*)    Neutro Abs 13.3 (*)    All other components within normal limits  COMPREHENSIVE METABOLIC PANEL - Abnormal; Notable for the following components:   Glucose, Bld 119 (*)    Albumin 2.9 (*)    AST 11 (*)    All other components within normal limits  RESPIRATORY PANEL BY RT PCR (FLU A&B, COVID)  LACTIC ACID, PLASMA  URINALYSIS, ROUTINE W REFLEX MICROSCOPIC  TROPONIN I (HIGH SENSITIVITY)  TROPONIN I (HIGH SENSITIVITY)    EKG EKG Interpretation  Date/Time:  Thursday October 11 2019 12:34:24 EDT Ventricular Rate:  112 PR Interval:    QRS Duration: 88 QT Interval:  328 QTC Calculation: 448 R Axis:   2 Text Interpretation: Sinus tachycardia Probable left atrial enlargement Borderline low voltage, extremity leads Abnormal R-wave progression, early transition No significant change since last tracing Confirmed by Dorie Rank [phone]) on 10/11/2019 12:37:21 PM   Radiology DG Pelvis 1-2 Views  Result Date: 10/11/2019 CLINICAL DATA:  Right buttock and  right femur pain without recent injury. EXAM: PELVIS - 1-2 VIEW COMPARISON:  None. FINDINGS: No acute fracture or dislocation is identified. Patient status post prior fixation of the right femur. Degenerative joint  changes of the visualized lower lumbar spine are noted. IMPRESSION: No acute fracture or dislocation. Electronically Signed   By: Abelardo Diesel M.D.   On: 10/11/2019 13:44   DG Chest Portable 1 View  Result Date: 10/11/2019 CLINICAL DATA:  78 year old female presenting with generalized pain and tachycardia. EXAM: PORTABLE CHEST 1 VIEW COMPARISON:  PET CT from 10/01/2019, CT chest from 09/04/2019, and chest radiograph from 09/16/2011 FINDINGS: The heart size and mediastinal contours are within normal limits. Scattered nodular pulmonary opacities with a lower lobe predominance, compatible with pulmonary metastatic lesions visualized on recent comparison chest CT. No new consolidative opacities. No pneumothorax or pleural effusion. Unchanged benign-appearing right glenoid cystic lesion. Aortic atherosclerosis. IMPRESSION: 1. No acute cardiopulmonary process. 2. Similar appearing scattered nodular opacities compatible with known pulmonary metastases. 3.  Aortic Atherosclerosis (ICD10-I70.0). Electronically Signed   By: Ruthann Cancer MD   On: 10/11/2019 13:53   DG FEMUR, MIN 2 VIEWS RIGHT  Result Date: 10/11/2019 CLINICAL DATA:  Recent injury, hip pain EXAM: RIGHT FEMUR 2 VIEWS COMPARISON:  05/14/2019, 10/11/2019 FINDINGS: Marked osteopenia. Previous right femur intramedullary rod with proximal distal fixation screws. Similar healed fracture of the right mid femur shaft. Right hip appears intact without displaced fracture or malalignment. Visualized right pelvis intact. Degenerative arthritis of the right knee. Peripheral atherosclerosis noted. No focal soft tissue abnormality. IMPRESSION: Previous right femur IM rod fixation for remote healed mid femur fracture. Osteopenia No acute displaced fracture by plain radiography Electronically Signed   By: Jerilynn Mages.  Shick M.D.   On: 10/11/2019 13:47    Procedures Procedures (including critical care time)  Medications Ordered in ED Medications  sodium chloride 0.9 %  bolus 1,000 mL (0 mLs Intravenous Stopped 10/11/19 1501)  iohexol (OMNIPAQUE) 350 MG/ML injection 60 mL (60 mLs Intravenous Contrast Given 10/11/19 1533)    ED Course  I have reviewed the triage vital signs and the nursing notes.  Pertinent labs & imaging results that were available during my care of the patient were reviewed by me and considered in my medical decision making (see chart for details).  Clinical Course as of Oct 10 1549  Thu Oct 11, 2019  1431 Chest x-ray shows no acute process.  Known pulmonary mets noted   [JK]  1431 Femur and pelvis x-rays without acute fracture   [JK]    Clinical Course User Index [JK] Dorie Rank, MD   MDM Rules/Calculators/A&P                          Patient's labs were notable for an increase in her white blood cell count although she does have a chronic leukocytosis.  Her anemia stable.  Electrolyte panel was unremarkable.  Covid test was negative and her troponin was negative.  No acute signs of pneumonia on x-ray.  Lactic acid levels also normal not suggesting sepsis.  She does have a low-grade temperature.  Will check urinalysis to assess for infection.  I have also ordered a CT angiogram to evaluate for possible pulmonary embolism considering her tachycardia and known malignancy.  Care turned over to Dr. Roderic Palau Final Clinical Impression(s) / ED Diagnoses Final diagnoses:  Tachycardia  Metastatic malignant neoplasm, unspecified site [facility])      Dorie Rank, MD 10/11/19 1551

## 2019-10-11 NOTE — ED Notes (Signed)
X-Ray at bedside at this time.

## 2019-10-11 NOTE — ED Notes (Signed)
Pt placed on Purewick at this time. Pt appears in no acute distress, respirations are even and unlabored with equal chest rise and fall. Bed is locked in the lowest position, side rails x2, call bell within reach and provided television at this time. Will continue to monitor at this time.

## 2019-10-11 NOTE — ED Triage Notes (Addendum)
Pt reports continued generalized pain and sent over by doctor related to elevated HR. Hospice referral made today by Dr. Legrand Rams. Pt currently being treated for bone cancer.

## 2019-10-11 NOTE — Discharge Instructions (Signed)
Follow-up with Dr. Raliegh Ip next week and return sooner if problems

## 2019-10-11 NOTE — ED Provider Notes (Signed)
Patient with tachycardia leukocytosis and bone cancer.  Patient's tachycardia improved with fluids.  I discussed the leukocytosis mild fever and CT angio results with lab tests results with Dr. Delton Coombes and he wanted [place] to get blood cultures x2 urine culture and start her on Augmentin and he will see you next week   Milton Ferguson, MD 10/11/19 1920

## 2019-10-11 NOTE — Patient Instructions (Signed)
Open Lymph Node Biopsy An open lymph node biopsy is a procedure to remove a lymph node so that it can be examined under a microscope. Lymph nodes are part of the body's disease-fighting system (immune system). The immune system protects the body from infections, germs, and diseases. An open lymph node biopsy may be done to:  Look for germs or cancer cells in your lymph node.  Find out why your lymph node is swollen.  Find out more about a condition you have. Lymph nodes are found in many locations in the body. Biopsies are often done on lymph nodes in the head, neck, armpit, or groin. Tell a health care provider about:  Any allergies you have.  All medicines you are taking, including vitamins, herbs, eye drops, creams, and over-the-counter medicines.  Any problems you or family members have had with anesthetic medicines.  Any blood disorders you have.  Any surgeries you have had.  Any medical conditions you have or have had.  Whether you are pregnant or may be pregnant. What are the risks? Generally, this is a safe procedure. However, problems may occur, including:  Infection.  Bleeding.  Allergic reactions to medicines.  Damage to other structures or organs, such as a nerve.  Scarring. What happens before the procedure? Medicines Ask your health care provider about:  Changing or stopping your regular medicines. This is especially important if you are taking diabetes medicines or blood thinners.  Taking medicines such as aspirin and ibuprofen. These medicines can thin your blood. Do not take these medicines unless your health care provider tells you to take them.  Taking over-the-counter medicines, vitamins, herbs, and supplements. General instructions  Follow instructions from your health care provider about eating or drinking restrictions.  You may have an exam or testing.  You may have a blood or urine sample taken.  Plan to have someone take you home from the  hospital or clinic.  If you will be going home right after the procedure, plan to have someone with you for 24 hours.  Ask your health care provider how your surgical site will be marked or identified.  Ask your health care provider what steps will be taken to help prevent infection. These may include: ? Removing hair at the biopsy site. ? Washing skin with a germ-killing soap. ? Taking antibiotic medicine. What happens during the procedure?   An IV will be inserted into one of your veins.  You will be given one or more of the following: ? A medicine to help you relax (sedative). ? A medicine to numb the area (local anesthetic).  An incision will be made in the area where your lymph node is located.  Your lymph node will be removed.  Your incision will be closed with stitches (sutures).  An antibiotic ointment may be applied to your incision.  A bandage (dressing) will be placed over your incision. The procedure may vary among health care providers and hospitals. What happens after the procedure?  Your blood pressure, heart rate, breathing rate, and blood oxygen level will be monitored until you leave the hospital or clinic.  Do not drive for 24 hours if you were given a sedative during your procedure.  It is up to you to get the results of your procedure. Ask your health care provider, or the department that is doing the procedure, when your results will be ready. Summary  An open lymph node biopsy is a procedure to remove a lymph node so that  it can be checked for infections, germs, and disease.  Generally, this is a safe procedure. However, problems may occur, including bleeding, infection, allergic reaction to medicines, and damage to other structures or organs.  Follow your health care provider's instructions before the procedure. These may include changing or stopping some medicines and restricting what you eat and drink.  During the procedure, an incision will be  made in the area of the lymph node, the lymph node will be removed, and the incision will be closed with sutures.  You will be monitored after the procedure. Do not drive for 24 hours if you were given a sedative during your procedure. This information is not intended to replace advice given to you by your health care provider. Make sure you discuss any questions you have with your health care provider. Document Revised: 08/11/2017 Document Reviewed: 08/11/2017 [employer] Patient Education  [place].

## 2019-10-11 NOTE — ED Notes (Addendum)
Dr. Roderic Palau made aware of patient refusing the in and out cath for Ua collection at this time. Aware that patient reports being able to void with the 562mL bolus and is requesting a drink at this time.   5:06 PM Per Dr. Roderic Palau, pt okay to drink at this time. This RN verbalized understanding and is in agreement.   5:25 PM Pt provided with a glass of water at this time. Pt is tolerating PO intake without distress. Educated on the need for a urine sample and is in agreement at this time.

## 2019-10-12 ENCOUNTER — Encounter (HOSPITAL_COMMUNITY): Payer: Self-pay

## 2019-10-12 NOTE — Progress Notes (Signed)
At the request of Dr. Cam Hai office, I have called and spoke with patient's friend, Jeneen Rinks regarding pain medication regimen. I explained both the Norco and Tramadol prescriptions and how to take both of them. I explained that both medications should not be taken at the same time. Jeneen Rinks tells me that the patient has not yet picked up the [place] prescription from the pharmacy but they intend to do that today. Jeneen Rinks verbalized understanding of medication regimen. I provided the clinic contact information should they have any further questions or concerns.

## 2019-10-13 LAB — URINE CULTURE

## 2019-10-17 ENCOUNTER — Other Ambulatory Visit: Payer: Self-pay

## 2019-10-17 ENCOUNTER — Ambulatory Visit (HOSPITAL_COMMUNITY)
Admission: RE | Admit: 2019-10-17 | Discharge: 2019-10-17 | Disposition: A | Discharge status: Home / Self Care | Payer: Medicare HMO | Source: Ambulatory Visit | Attending: Hematology | Admitting: Hematology

## 2019-10-17 DIAGNOSIS — K769 Liver disease, unspecified: Secondary | ICD-10-CM

## 2019-10-17 DIAGNOSIS — C7951 Secondary malignant neoplasm of bone: Secondary | ICD-10-CM | POA: Diagnosis not present

## 2019-10-17 DIAGNOSIS — N2889 Other specified disorders of kidney and ureter: Secondary | ICD-10-CM | POA: Diagnosis not present

## 2019-10-17 MED ORDER — GADOBUTROL 1 MMOL/ML IV SOLN
4.0000 mL | Freq: Once | INTRAVENOUS | Status: AC | PRN
  Administered 2019-10-17: 4 mL via INTRAVENOUS

## 2019-10-23 ENCOUNTER — Encounter: Payer: Self-pay | Admitting: Urology

## 2019-10-23 ENCOUNTER — Encounter (HOSPITAL_COMMUNITY): Payer: Self-pay | Admitting: Hematology

## 2019-10-23 ENCOUNTER — Inpatient Hospital Stay (HOSPITAL_COMMUNITY): Payer: Medicare HMO | Attending: Hematology | Admitting: Hematology

## 2019-10-23 ENCOUNTER — Ambulatory Visit (INDEPENDENT_AMBULATORY_CARE_PROVIDER_SITE_OTHER): Payer: Medicare HMO | Admitting: Urology

## 2019-10-23 ENCOUNTER — Other Ambulatory Visit: Payer: Self-pay

## 2019-10-23 ENCOUNTER — Encounter (HOSPITAL_COMMUNITY): Payer: Self-pay | Admitting: Radiology

## 2019-10-23 VITALS — BP 134/84 | HR 128 | Temp 97.8°F | Ht 61.0 in | Wt 75.0 lb

## 2019-10-23 VITALS — BP 120/77 | HR 121 | Temp 96.8°F | Resp 18

## 2019-10-23 DIAGNOSIS — F418 Other specified anxiety disorders: Secondary | ICD-10-CM | POA: Diagnosis not present

## 2019-10-23 DIAGNOSIS — M1990 Unspecified osteoarthritis, unspecified site: Secondary | ICD-10-CM | POA: Insufficient documentation

## 2019-10-23 DIAGNOSIS — I739 Peripheral vascular disease, unspecified: Secondary | ICD-10-CM | POA: Insufficient documentation

## 2019-10-23 DIAGNOSIS — I700 Atherosclerosis of aorta: Secondary | ICD-10-CM | POA: Diagnosis not present

## 2019-10-23 DIAGNOSIS — K769 Liver disease, unspecified: Secondary | ICD-10-CM

## 2019-10-23 DIAGNOSIS — D649 Anemia, unspecified: Secondary | ICD-10-CM | POA: Diagnosis not present

## 2019-10-23 DIAGNOSIS — Z5112 Encounter for antineoplastic immunotherapy: Secondary | ICD-10-CM | POA: Diagnosis present

## 2019-10-23 DIAGNOSIS — C7951 Secondary malignant neoplasm of bone: Secondary | ICD-10-CM | POA: Insufficient documentation

## 2019-10-23 DIAGNOSIS — Z79899 Other long term (current) drug therapy: Secondary | ICD-10-CM | POA: Diagnosis not present

## 2019-10-23 DIAGNOSIS — I10 Essential (primary) hypertension: Secondary | ICD-10-CM | POA: Insufficient documentation

## 2019-10-23 DIAGNOSIS — R918 Other nonspecific abnormal finding of lung field: Secondary | ICD-10-CM

## 2019-10-23 DIAGNOSIS — F17210 Nicotine dependence, cigarettes, uncomplicated: Secondary | ICD-10-CM | POA: Insufficient documentation

## 2019-10-23 DIAGNOSIS — N2889 Other specified disorders of kidney and ureter: Secondary | ICD-10-CM

## 2019-10-23 DIAGNOSIS — J449 Chronic obstructive pulmonary disease, unspecified: Secondary | ICD-10-CM | POA: Insufficient documentation

## 2019-10-23 DIAGNOSIS — Z8711 Personal history of peptic ulcer disease: Secondary | ICD-10-CM | POA: Diagnosis not present

## 2019-10-23 DIAGNOSIS — R634 Abnormal weight loss: Secondary | ICD-10-CM | POA: Diagnosis not present

## 2019-10-23 DIAGNOSIS — C641 Malignant neoplasm of right kidney, except renal pelvis: Secondary | ICD-10-CM | POA: Diagnosis not present

## 2019-10-23 DIAGNOSIS — R69 Illness, unspecified: Secondary | ICD-10-CM | POA: Diagnosis not present

## 2019-10-23 DIAGNOSIS — E785 Hyperlipidemia, unspecified: Secondary | ICD-10-CM | POA: Diagnosis not present

## 2019-10-23 DIAGNOSIS — K581 Irritable bowel syndrome with constipation: Secondary | ICD-10-CM | POA: Diagnosis not present

## 2019-10-23 LAB — URINALYSIS, ROUTINE W REFLEX MICROSCOPIC
Bilirubin, UA: NEGATIVE
Glucose, UA: NEGATIVE
Leukocytes,UA: NEGATIVE
Nitrite, UA: NEGATIVE
RBC, UA: NEGATIVE
Specific Gravity, UA: 1.015 (ref 1.005–1.030)
Urobilinogen, Ur: 0.2 mg/dL (ref 0.2–1.0)
pH, UA: 5.5 (ref 5.0–7.5)

## 2019-10-23 LAB — MICROSCOPIC EXAMINATION: Renal Epithel, UA: NONE SEEN /hpf

## 2019-10-23 NOTE — Progress Notes (Signed)
Alicia [name], Alicia [name] [id number]   CLINIC:  Medical Oncology/Hematology  PCP:  Rosita Fire, MD [place] / [name] [address] [phone]   REASON FOR VISIT:  Follow-up for right kidney mass  PRIOR THERAPY: None  NGS Results: Not done  CURRENT THERAPY: Under work-up  BRIEF ONCOLOGIC HISTORY:  Oncology History   No history exists.    CANCER STAGING: Cancer Staging No matching staging information was found for the patient.  INTERVAL HISTORY:  Alicia [name], a 79 y.o. female, returns for routine follow-up of her right kidney mass. [name] was last seen on 10/08/2019. She went to Dr. Arnoldo Morale on 9/23 to have her biopsy, but was in a lot of pain and was tachycardic, so she went to Dr. Legrand Rams and from there to [place].  Today she reports that she hurts along her entire right flank; she tried wearing a back brace, but it worsened the pain. The pain is also bad when she lies down and needs to lie down on her left side to get comfortable. She is taking Norco and tramadol for her pain around the clock. Her appetite is good and she denies having dysuria, though she is unable to hold urine for too long.  She is not ready to give up and wants to proceed with treatment.   REVIEW OF SYSTEMS:  Review of Systems  Constitutional: Positive for appetite change (75%) and fatigue (25%).  Respiratory: Positive for shortness of breath.   Cardiovascular: Positive for chest pain (8/10 R side under breast chest pain) and palpitations.  Genitourinary: Positive for bladder incontinence.   Psychiatric/Behavioral: Positive for depression and sleep disturbance.    PAST MEDICAL/SURGICAL HISTORY:  Past Medical History:  Diagnosis Date  . Allergic rhinitis   . Anxiety   . Cataract   . COPD (chronic obstructive pulmonary disease) ([place])   . Depression   . DeQuervain's disease (tenosynovitis)   . Hyperlipidemia   . Hypertension   . Irritable  bowel syndrome with constipation   . Knee fracture   . Low back pain   . Migraines   . Osteoarthritis   . Peptic ulcer disease   . PVD (peripheral vascular disease) ([place])   . Right rotator cuff tear    Past Surgical History:  Procedure Laterality Date  . back tumor    . BIOPSY  08/31/2019   Procedure: BIOPSY;  Surgeon: Harvel Quale, MD;  Location: [facility];  Service: Gastroenterology;;  . breast tumor    . CATARACT EXTRACTION W/PHACO Right 03/13/2012   Procedure: CATARACT EXTRACTION PHACO AND INTRAOCULAR LENS PLACEMENT (IOC);  Surgeon: Tonny Branch, MD;  Location: [facility];  Service: Ophthalmology;  Laterality: Right;  CDE:15.51  . CATARACT EXTRACTION W/PHACO Left 03/27/2012   Procedure: CATARACT EXTRACTION PHACO AND INTRAOCULAR LENS PLACEMENT (IOC);  Surgeon: Tonny Branch, MD;  Location: [facility];  Service: Ophthalmology;  Laterality: Left;  CDE=10.91  . cataract surgery    . COLONOSCOPY  12/08/2011   Procedure: COLONOSCOPY;  Surgeon: Daneil Dolin, MD;  Location: [facility];  Service: Endoscopy;  Laterality: N/A;  11:15 AM  . COLONOSCOPY WITH PROPOFOL N/A 08/31/2019   Procedure: COLONOSCOPY WITH PROPOFOL;  Surgeon: Harvel Quale, MD;  [facility];  Service: Gastroenterology;  Laterality: N/A;  815  . CYST EXCISION Right July 2015   Right Preauricular area  . ESOPHAGOGASTRODUODENOSCOPY (EGD) WITH PROPOFOL N/A 08/31/2019   Procedure: ESOPHAGOGASTRODUODENOSCOPY (EGD) WITH  PROPOFOL;  Surgeon: Montez Morita, Quillian Quince, MD;  Location: [facility];  Service: Gastroenterology;  Laterality: N/A;  . PR VEIN BYPASS GRAFT,AORTO-FEM-POP    . right ankle otif    . right femoral to above the knee popliteal bypass    . right femur fracture    . right inguinal herniorraphy      SOCIAL HISTORY:  Social History   Socioeconomic History  . Marital status: Single    Spouse name: Not on file  . Number of children: 0  . Years of education: Not on file  .  Highest education level: Not on file  Occupational History  . Not on file  Tobacco Use  . Smoking status: Current Every Day Smoker    Packs/day: 1.00    Years: 52.00    Pack years: 52.00    Types: Cigarettes  . Smokeless tobacco: Never Used  . Tobacco comment: pt states she smokes about 3-4 cigarettes a day  Vaping Use  . Vaping Use: Never assessed  Substance and Sexual Activity  . Alcohol use: No    Comment: she quit 1 year ago  . Drug use: No  . Sexual activity: Not on file  Other Topics Concern  . Not on file  Social History Narrative  . Not on file   Social Determinants of Health   Financial Resource Strain: Low Risk   . Difficulty of Paying Living Expenses: Not very hard  Food Insecurity: No Food Insecurity  . Worried About Charity fundraiser in the Last Year: Never true  . Ran Out of Food in the Last Year: Never true  Transportation Needs: No Transportation Needs  . Lack of Transportation (Medical): No  . Lack of Transportation (Non-Medical): No  Physical Activity: Inactive  . Days of Exercise per Week: 0 days  . Minutes of Exercise per Session: 0 min  Stress: Stress Concern Present  . Feeling of Stress : To some extent  Social Connections: Moderately Isolated  . Frequency of Communication with Friends and Family: Three times a week  . Frequency of Social Gatherings with Friends and Family: Three times a week  . Attends Religious Services: More than 4 times per year  . Active Member of Clubs or Organizations: No  . Attends Archivist Meetings: Never  . Marital Status: Never married  Intimate Partner Violence: Not At Risk  . Fear of Current or Ex-Partner: No  . Emotionally Abused: No  . Physically Abused: No  . Sexually Abused: No    FAMILY HISTORY:  Family History  Problem Relation Age of Onset  . Hypertension Mother   . Heart attack Mother   . Stroke Mother   . Colon cancer Neg Hx     CURRENT MEDICATIONS:  Current Outpatient Medications    Medication Sig Dispense Refill  . acetaminophen (TYLENOL) 500 MG tablet Take 1 tablet (500 mg total) by mouth every 6 (six) hours as needed. (Patient not taking: Reported on 10/11/2019) 30 tablet 0  . amLODipine (NORVASC) 10 MG tablet Take 10 mg by mouth daily.    Marland Kitchen amoxicillin-clavulanate (AUGMENTIN) 875-125 MG tablet Take 1 tablet by mouth 2 (two) times daily. One po bid x 7 days 14 tablet 0  . cilostazol (PLETAL) 100 MG tablet Take 100 mg by mouth 2 (two) times daily.     . fluticasone (FLONASE) 50 MCG/ACT nasal spray Place 1 spray into both nostrils daily.    Marland Kitchen gabapentin (NEURONTIN) 100 MG capsule Take 200  mg by mouth at bedtime.     Marland Kitchen HYDROcodone-acetaminophen (NORCO) 5-325 MG tablet Take 1 tablet by mouth every 8 (eight) hours as needed for moderate pain. 90 tablet 0  . losartan (COZAAR) 50 MG tablet Take 50 mg by mouth daily.     . mirtazapine (REMERON) 15 MG tablet Take 15 mg by mouth at bedtime.      Marland Kitchen oxybutynin (DITROPAN-XL) 10 MG 24 hr tablet Take 10 mg by mouth at bedtime.    . risedronate (ACTONEL) 35 MG tablet Take 35 mg by mouth every Thursday. with water on empty stomach, nothing by mouth or lie down for next 30 minutes.    . simvastatin (ZOCOR) 20 MG tablet Take 20 mg by mouth at bedtime.      . traMADol (ULTRAM) 50 MG tablet Take 50 mg by mouth 2 (two) times daily as needed.      No current facility-administered medications for this visit.   Facility-Administered Medications Ordered in Other Visits  Medication Dose Route Frequency Provider Last Rate Last Admin  . fentaNYL (SUBLIMAZE) injection 25-50 mcg  25-50 mcg Intravenous Q5 min PRN Lerry Liner, MD        ALLERGIES:  Allergies  Allergen Reactions  . Aspirin Other (See Comments)    stomach upset    PHYSICAL EXAM:  Performance status (ECOG): 1 - Symptomatic but completely ambulatory  Vitals:   10/23/19 1250  BP: 120/77  Pulse: (!) 121  Resp: 18  Temp: (!) 96.8 F (36 C)  SpO2: 98%   Wt Readings from  Last 3 Encounters:  10/11/19 74 lb 15.3 oz (34 kg)  10/11/19 77 lb (34.9 kg)  10/08/19 77 lb 3.2 oz (35 kg)   Physical Exam   LABORATORY DATA:  I have reviewed the labs as listed.  CBC Latest Ref Rng & Units 10/11/2019 09/20/2019 08/09/2019  WBC 4.0 - 10.5 K/uL 15.7(H) 12.4(H) 11.2(H)  Hemoglobin 12.0 - 15.0 g/dL 10.2(L) 10.3(L) 13.1  Hematocrit 36 - 46 % 33.2(L) 34.6(L) 40.7  Platelets 150 - 400 K/uL 536(H) 478(H) 435(H)   CMP Latest Ref Rng & Units 10/11/2019 09/20/2019 08/09/2019  Glucose 70 - 99 mg/dL 119(H) 102(H) 118(H)  BUN 8 - 23 mg/dL 16 30(H) 14  Creatinine 0.44 - 1.00 mg/dL 0.82 0.87 0.90  Sodium 135 - 145 mmol/L 135 135 137  Potassium 3.5 - 5.1 mmol/L 4.1 4.2 4.6  Chloride 98 - 111 mmol/L 99 99 99  CO2 22 - 32 mmol/L 25 23 26   Calcium 8.9 - 10.3 mg/dL 9.1 9.3 10.0  Total Protein 6.5 - 8.1 g/dL 7.2 7.9 7.8  Total Bilirubin 0.3 - 1.2 mg/dL 0.5 0.4 0.5  Alkaline Phos 38 - 126 U/L 95 79 -  AST 15 - 41 U/L 11(L) 12(L) 8(L)  ALT 0 - 44 U/L 8 8 3(L)    DIAGNOSTIC IMAGING:  I have independently reviewed the scans and discussed with the patient. DG Pelvis 1-2 Views  Result Date: 10/11/2019 CLINICAL DATA:  Right buttock and right femur pain without recent injury. EXAM: PELVIS - 1-2 VIEW COMPARISON:  None. FINDINGS: No acute fracture or dislocation is identified. Patient status post prior fixation of the right femur. Degenerative joint changes of the visualized lower lumbar spine are noted. IMPRESSION: No acute fracture or dislocation. Electronically Signed   By: Abelardo Diesel M.D.   On: 10/11/2019 13:44   CT Angio Chest PE W and/or Wo Contrast  Result Date: 10/11/2019 CLINICAL DATA:  Pain, tachycardia, renal  mass, adenopathy, weight loss EXAM: CT ANGIOGRAPHY CHEST WITH CONTRAST TECHNIQUE: Multidetector CT imaging of the chest was performed using the standard protocol during bolus administration of intravenous contrast. Multiplanar CT image reconstructions and MIPs were obtained to  evaluate the vascular anatomy. CONTRAST:  76mL OMNIPAQUE IOHEXOL 350 MG/ML SOLN COMPARISON:  PET-CT 10/01/2019 and previous FINDINGS: Cardiovascular: Heart size normal. Trace pericardial fluid. The RV is nondilated. Satisfactory opacification of pulmonary arteries noted, and there is no evidence of pulmonary emboli. There is short-segment stenosis at the ostium of the inferior left pulmonary vein. Scattered coronary calcifications. Adequate contrast opacification of the thoracic aorta with no evidence of dissection, aneurysm, or stenosis. There is bovine variant brachiocephalic arch anatomy. Calcified plaque at the origin of the left subclavian artery resulting in short-segment stenosis of possible hemodynamic significance. Scattered calcified aortic plaque. Mediastinum/Nodes: Stable prevascular and right paratracheal adenopathy. Lungs/Pleura: Trace pleural fluid bilaterally. No pneumothorax. Stable scattered pulmonary nodules in all lobes largest 0.8 cm right middle lobe (6:66) some increase in dependent atelectasis posteriorly in both lower lobes. Upper Abdomen: Multiple hepatic cysts. 1.6 cm segment 5 hepatic lesion, previously 1.2. retrocrural and left para-aortic adenopathy as before. No acute findings. Musculoskeletal: Multiple lytic/permeative metastatic lesions in the thoracic spine, ribs, and sternum as previously demonstrated. Mild pathologic compression deformity of T6, new since 09/04/2019. Stable T5, T9, and T11 compression deformities. Minimally displaced pathologic fracture of right fourth rib laterally. Review of the MIP images confirms the above findings. IMPRESSION: 1. Negative for acute PE or thoracic aortic dissection. 2. Bilateral pulmonary nodules, osseous metastatic disease, mediastinal and retroperitoneal adenopathy as before. 3. Pathologic fractures of right fourth rib and subacute compression fracture of T6 vertebral body. 4. 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 Correlate with upper extremity pressures and any evidence of subclavian steal. 5. Trace bilateral pleural fluid. Aortic Atherosclerosis (ICD10-I70.0). Electronically Signed   By: Lucrezia Europe M.D.   On: 10/11/2019 16:21   MR Brain W Wo Contrast  Result Date: 10/17/2019 CLINICAL DATA:  79 year old female with renal mass with abdominal lymphadenopathy, and suspicion of pulmonary and bony metastatic disease on recent CTA chest. Staging. EXAM: MRI HEAD WITHOUT AND WITH CONTRAST TECHNIQUE: Multiplanar, multiecho pulse sequences of the brain and surrounding structures were obtained without and with intravenous contrast. CONTRAST:  40mL GADAVIST GADOBUTROL 1 MMOL/ML IV SOLN COMPARISON:  PET-CT 10/01/2019. FINDINGS: Brain: Cerebral volume is within normal limits for age. No restricted diffusion to suggest acute infarction. No midline shift, mass effect, evidence of mass lesion, ventriculomegaly, extra-axial collection or acute intracranial hemorrhage. Cervicomedullary junction and pituitary are within normal limits. No abnormal enhancement identified.  No dural thickening. Largely normal for age gray and white matter signal throughout the brain; mild nonspecific scattered white matter T2 and FLAIR hyperintensity, mostly in the anterior frontal lobes. Possible chronic microhemorrhage in the right parietal lobe on series 13, image 35. No other chronic cerebral blood products. Vascular: Major intracranial vascular flow voids are preserved. Skull and upper cervical spine: There is a roughly 3 cm area of suspicious sclerosis of the right parietal bone (series 10, image 15 and series 18, image 7) with, but this demonstrates only subtle increased signal on DWI (series 7, image 7) and little postcontrast enhancement. Elsewhere in the skull bone marrow signal is within normal limits. Heterogeneous marrow signal in the cervical spine at C3 could be degenerative in nature. However, there is suspicious marrow  replacement of the left C4 posterior elements on series  9, image 12. Negative visible cervical spinal cord. Sinuses/Orbits: Postoperative changes to both globes, otherwise negative orbits. Mild paranasal sinus periosteal thickening suspected such as from previous inflammation. Other: Trace bilateral mastoid fluid. Grossly normal visible internal auditory structures. Scalp and face soft tissues appear negative. IMPRESSION: 1. No metastatic disease to the brain or acute intracranial abnormality identified. 2. 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 Electronically Signed   By: Genevie Ann M.D.   On: 10/17/2019 12:21   NM PET Image Initial (PI) Skull Base To Thigh  Result Date: 10/02/2019 CLINICAL DATA:  Initial treatment strategy for lung nodules. EXAM: NUCLEAR MEDICINE PET SKULL BASE TO THIGH TECHNIQUE: 6.85 mCi F-18 FDG was injected intravenously. Full-ring PET imaging was performed from the skull base to thigh after the radiotracer. CT data was obtained and used for attenuation correction and anatomic localization. Fasting blood glucose: 129 mg/dl COMPARISON:  09/04/2019 FINDINGS: Mediastinal blood pool activity: SUV max 1.73 Liver activity: SUV max NA NECK: High left posterior cervical lymph node is FDG avid. This measures 0.8 cm within SUV max of 5.69. Just above the epiglottis there is a left paralaryngeal soft tissue nodule which is FDG avid within SUV max of 7.4. Incidental CT findings: None the CHEST: Enlarged left retropectoral and left axillary lymph nodes are identified. Index lymph node measures 1.2 cm with SUV max of 3.12, Bilateral FDG avid mediastinal lymph nodes are identified. Index left pre vascular node measures 2 cm with SUV max of 6.13. Index right paratracheal lymph node measures 0.9 cm with SUV max of 14.9. Numerous small lung nodules are identified scattered throughout both lungs. Most of these are too small to reliably characterize. For example, within the right  upper lobe there is a 5 mm lung nodule with SUV max of 0.9, image 59/3. Index nodule within the lingula measures 0.8 cm within SUV max of 1.57. Subpleural nodule in the posterior left lower lobe measures 0.7 cm and has an SUV max of 3.17. Incidental CT findings: Mild cardiac enlargement. No significant pericardial effusion. Aortic atherosclerosis with coronary artery calcifications. Mild changes of centrilobular emphysema. Bibasilar scarring/platelike atelectasis noted. ABDOMEN/PELVIS: Within the lateral aspect of the right hepatic lobe there is a solitary FDG avid hypodense lesion measuring 1.2 cm with SUV max of 9.12, image 119/3. Low-density structure within the anterior right hepatic lobe measuring 1 cm is noted without corresponding FDG uptake and is favored to represent a small liver cyst. Bilateral nephromegaly with numerous renal cysts. The complex, solid enhancing lesion arising from the anterior cortex of the inferior right kidney as characterized on recent CT is intensely FDG avid within SUV max of 13.2. There is extensive FDG avid retroperitoneal nodal metastasis. Conglomeration of retroperitoneal lymph nodes at the level of the right renal hilum measures 8.7 x 5.2 cm and has an SUV max of 8.85. Incidental CT findings: Aortic atherosclerosis. No aneurysm identified. SKELETON: Extensive FDG avid bone metastases are identified involving the proximal appendicular skeleton and axial skeleton. These lesions are too numerous to count. Index lesion within the sternal manubrium has an SUV max of 11.84 without definite corresponding changes on the CT images. Similarly, there is an FDG avid lesion within the distal body of sternum within SUV max of 9.9. Numerous FDG avid lesions are identified involving the cervical, thoracic and lumbar spine. Index lesion involving the T7 vertebra has an SUV max of 13.15. Within the L1 vertebra there is a large focus of increased tracer uptake involving the  vertebral body and  posterior elements. This has an SUV max of 16.39. FDG avid lesion within the right posterior iliac bone has an SUV max of 8.11. Within the left acetabulum there is a large FDG avid lesion within SUV max of 6.92. Sclerotic changes are noted on the corresponding CT images measuring approximately 3.1 cm, image 1099/3. Within the lesser trochanter of the proximal left femur there is a small focus of increased uptake which has an SUV max of 5.52. FDG avid bilateral rib lesions are also noted including the posterior left first rib which has an SUV max of 4.49. Multiple bilateral FDG avid scapular lesions are also noted including a small lesion in the spine of the left scapula with SUV max of 6.23. Incidental CT findings: Again seen are compression deformities within the T9 T11 and T3 vertebra. IMPRESSION: 1. There is intense FDG uptake associated with the in solid enhancing mass involving the inferior pole of right kidney concerning for primary renal cell carcinoma. 2. Bulky FDG avid retroperitoneal adenopathy is identified within the retroperitoneum which encases and up lifts the retroperitoneal structures. FDG avid lymph nodes are also identified within the left cervical nodal chain and left axilla and left retropectoral region as well as the mediastinum. 3. Innumerable FDG avid bone lesions are identified. Most of these do not exhibit any significant lytic or sclerotic changes on corresponding CT images. 4. Solitary FDG avid right hepatic lobe liver metastasis. 5. Innumerable small (mostly less than 1 cm) pulmonary nodules are identified which are too small to reliably characterize by PET-CT but are highly suspicious for foci of metastasis. 6. Contrast enhanced MRI of the brain and spine may be helpful to assess for CNS involvement by tumor. 7. Aortic Atherosclerosis (ICD10-I70.0) and Emphysema (ICD10-J43.9). Coronary artery calcifications noted. Electronically Signed   By: Kerby Moors M.D.   On: 10/02/2019 11:35    DG Chest Portable 1 View  Result Date: 10/11/2019 CLINICAL DATA:  79 year old female presenting with generalized pain and tachycardia. EXAM: PORTABLE CHEST 1 VIEW COMPARISON:  PET CT from 10/01/2019, CT chest from 09/04/2019, and chest radiograph from 09/16/2011 FINDINGS: The heart size and mediastinal contours are within normal limits. Scattered nodular pulmonary opacities with a lower lobe predominance, compatible with pulmonary metastatic lesions visualized on recent comparison chest CT. No new consolidative opacities. No pneumothorax or pleural effusion. Unchanged benign-appearing right glenoid cystic lesion. Aortic atherosclerosis. IMPRESSION: 1. No acute cardiopulmonary process. 2. Similar appearing scattered nodular opacities compatible with known pulmonary metastases. 3.  Aortic Atherosclerosis (ICD10-I70.0). Electronically Signed   By: Ruthann Cancer MD   On: 10/11/2019 13:53   DG FEMUR, MIN 2 VIEWS RIGHT  Result Date: 10/11/2019 CLINICAL DATA:  Recent injury, hip pain EXAM: RIGHT FEMUR 2 VIEWS COMPARISON:  05/14/2019, 10/11/2019 FINDINGS: Marked osteopenia. Previous right femur intramedullary rod with proximal distal fixation screws. Similar healed fracture of the right mid femur shaft. Right hip appears intact without displaced fracture or malalignment. Visualized right pelvis intact. Degenerative arthritis of the right knee. Peripheral atherosclerosis noted. No focal soft tissue abnormality. IMPRESSION: Previous right femur IM rod fixation for remote healed mid femur fracture. Osteopenia No acute displaced fracture by plain radiography Electronically Signed   By: Jerilynn Mages.  Shick M.D.   On: 10/11/2019 13:47     ASSESSMENT:  1. Right kidney mass: -Evaluation for weight loss with CT CAP on 09/04/2019 showed 5 cm right kidney mass, pathologically enlarged prevascular lymph nodes in the chest, enlarged left axillary lymph node, central mesenteric  lymph node, adenopathy surrounding the abdominal aorta  and numerous small bilateral pulmonary nodules. -MRI of the brain on 10/17/2019 with no metastatic disease. -LDH was elevated at 314.  SPEP was negative.  2. Weight loss: -30 to 40 pound weight loss since January of this year. Initially had vomiting which subsided at this time. She does report occasional night sweats. -Colonoscopy on 08/31/2019 shows diverticulosis in the sigmoid colon, descending colon and and ascending colon. 2 nonbleeding colonic angiodysplastic lesions. One 18millimeter polyp in the sigmoid colon. -EGD on 08/31/2019 shows 3 cm hiatal hernia, normal stomach, duodenum.  3. Social/family history: -Quit smoking 2 and half months ago. She lives alone with her dog. -Smoked 1 to 2 packs/day for 60 years. -No family history of malignancies.   PLAN:  1.   Metastatic kidney cancer versus lymphoma: -She was evaluated by Dr. Arnoldo Morale and was sent to ER because of tachycardia. -CT chest PE protocol on 10/11/2019 was negative for PE.  Bilateral pulmonary nodules.  Pathological fractures of the right fourth rib and subacute compression fracture of the T6 vertebral body. -She is able to walk with the help of walker.  She is able to do her activities at home including washing dishes. -We had a prolonged discussion about doing further work-up in the form of biopsy versus referral to palliative care/hospice for comfort measures. -Patient wants to know her diagnosis before making that decision. -We will arrange for biopsy of liver lesion by ultrasound guidance. -I will see her back after the biopsy.  2. Weight loss: -She is continuing to lose weight.  She lost about 3 pounds.  3.  Right chest wall pain: -This is coming from rib fractures. -She will take hydrocodone 5/325 mg every 8 hours as needed.  She also has tramadol 50 mg every 12 hours as needed.   Orders placed this encounter:  No orders of the defined types were placed in this encounter.    Derek Jack,  MD [place] [phone]   I, Milinda Antis, am acting as a scribe for Dr. Sanda Linger.  I, Derek Jack MD, have reviewed the above documentation for accuracy and completeness, and I agree with the above.

## 2019-10-23 NOTE — Progress Notes (Signed)
Alicia [name] Female, 79 y.o., 15-Nov-1940 MRN:  [id number] Phone:  [phone] Alicia [name]) PCP:  Rosita Fire, MD Primary Cvg:  [employer] Medicare/[employer] Hmo/Ppo Next Appt With Oncology 11/12/2019 at 12:00 PM  RE: US Liver Biopsy Received: Today Arne Cleveland, MD  Arlyn Leak   [place] core liver lesion  See PET CT Im 61 fused   DDH       Previous Messages   ----- Message -----  From: Garth Bigness D  Sent: 10/23/2019  1:56 PM EDT  To: Ir Procedure Requests  Subject: US Liver Biopsy                  Procedure: US Liver Biopsy   Reason: Liver lesion   History: mR, CT, NM PET in computer   Provider:  Derek Jack   Provider Contact: [phone]

## 2019-10-23 NOTE — Progress Notes (Signed)
Urological Symptom Review  Patient is experiencing the following symptoms: Frequent urination Get up at night to urinate Leakage of urine Injury to kidneys/bladder   Review of Systems  Gastrointestinal (upper)  : Negative for upper GI symptoms  Gastrointestinal (lower) : Negative for lower GI symptoms  Constitutional : Night Sweats  Weight loss  Skin: Negative for skin symptoms  Eyes: Negative for eye symptoms  Ear/Nose/Throat : Negative for Ear/Nose/Throat symptoms  Hematologic/Lymphatic: Negative for Hematologic/Lymphatic symptoms  Cardiovascular : Negative for cardiovascular symptoms  Respiratory : Shortness of breath  Endocrine: Negative for endocrine symptoms  Musculoskeletal: Back pain  Neurological: Negative for neurological symptoms  Psychologic: Depression

## 2019-10-23 NOTE — Patient Instructions (Signed)
Renal Mass  A renal mass is a growth in the kidney. A renal mass may be found while performing an MRI, CT scan, or ultrasound for other problems of the abdomen. Certain types of cancers, infections, or injuries can cause a renal mass. A renal mass that is cancerous (malignant) may grow or spread quickly. Others are harmless (benign). What are common types of renal masses? Renal masses include:  Tumors. These may be cancerous (malignant) or noncancerous (benign). ? The most common type of kidney cancer is renal cell carcinoma. ? The most common benign tumors of the kidney include renal adenomas, oncocytomas, and angiomyolipoma (AML).  Cysts. These are fluid-filled sacs that form on or in the kidney. ? It is not always known what causes a cyst to develop in or on the kidney. ? Most kidney cysts do not cause symptoms and do not need to be treated. What type of testing might I need? Your health care provider may recommend that you have tests to diagnose the cause of your renal mass. The following tests may be done if a renal mass is found:  Physical exam.  Blood tests.  Urine tests.  Imaging tests, such as ultrasound, CT scan, or MRI.  Biopsy. This is a small sample that is removed from the renal mass and tested in a lab. The exact tests and how often they are done will depend on:  The size and appearance of the renal mass.  Risk factors or medical conditions that increase your risk for problems.  Any symptoms associated with the renal mass, or concerns that you have about it. Tests and physical exams may be done once, or they may be done regularly for a period of time. Tests and exams that are done regularly will help monitor whether the mass is growing and beginning to cause problems. What are common treatments for renal masses? Treatment is not always needed for this condition. Your health care provider may recommend careful monitoring (watchful waiting) and regular tests and exams.  Treatment will depend on the cause of the mass. Follow these instructions at home: What you need to do at home will depend on the cause of the mass. Follow the instructions that your health care provider gives to you. In general:  Take over-the-counter and prescription medicines only as told by your health care provider.  If you are prescribed an antibiotic medicine, take it as told by your health care provider. Do not stop taking the antibiotic even if you start to feel better.  Follow any restrictions that are given to you by your health care provider.  Keep all follow-up visits as told by your health care provider. This is important. ? You may need to see your health care provider once or twice a year to have CT scans and ultrasounds done. These tests will show if your renal mass has changed or grown bigger. Contact a health care provider if you:  Have pain in the side or back (flank pain).  Have a fever.  Feel full soon after eating.  Have pain or swelling in the abdomen.  Lose weight. Get help right away if:  Your pain gets worse.  There is blood in your urine.  You cannot urinate.  You have chest pain.  You have trouble breathing. Summary  A renal mass is a growth in the kidney. It may be cancerous (malignant) and grow or spread quickly, or it may be harmless (benign).  Renal masses may be found while performing   an MRI, CT scan, or ultrasound for other problems of the abdomen.  Your health care provider may recommend that you have tests to diagnose the cause of your renal mass. This may include a physical exam, blood tests, urine tests, imaging, or a biopsy.  Treatment is not always needed for this condition. Careful monitoring (watchful waiting) may be recommended. This information is not intended to replace advice given to you by your health care provider. Make sure you discuss any questions you have with your health care provider. Document Revised: 02/10/2017  Document Reviewed: 02/10/2017 Elsevier Patient Education  2020 Elsevier Inc.  

## 2019-10-23 NOTE — Progress Notes (Signed)
10/23/2019 3:06 PM   Bertram Millard ENSLEY BLAS 1940-01-24 [id number]  Referring provider: Rosita Fire, MD [place],  [place] [address]  Right renal mass  HPI: Ms Usery is a 79yo here for evaluation of a right renal mass. She presented to ER in JUly 2021 with fever and unintensional weight loss.  She underwent CT which showed a right renal mass and retorperitoneal lymphadenopathy. She was evaluated by Dr. Delton Coombes at [place] [facility] and she underwent PET which showed bony metastasis, lung lesions, retroperitoneal lymphadenopathy and a 5cm right renal mass. She denies any flank pain. She is scheduled for a lymph node biopsy  PMH: Past Medical History:  Diagnosis Date  . Allergic rhinitis   . Anxiety   . Cataract   . COPD (chronic obstructive pulmonary disease) ([place])   . Depression   . DeQuervain's disease (tenosynovitis)   . High cholesterol   . History of kidney cancer   . Hyperlipidemia   . Hypertension   . Irritable bowel syndrome with constipation   . Knee fracture   . Low back pain   . Migraines   . Osteoarthritis   . Peptic ulcer disease   . PVD (peripheral vascular disease) ([place])   . Right rotator cuff tear     Surgical History: Past Surgical History:  Procedure Laterality Date  . back tumor    . BIOPSY  08/31/2019   Procedure: BIOPSY;  Surgeon: Harvel Quale, MD;  [facility];  Service: Gastroenterology;;  . breast tumor    . CATARACT EXTRACTION W/PHACO Right 03/13/2012   Procedure: CATARACT EXTRACTION PHACO AND INTRAOCULAR LENS PLACEMENT (IOC);  Surgeon: Tonny Branch, MD;  Location: [facility];  Service: Ophthalmology;  Laterality: Right;  CDE:15.51  . CATARACT EXTRACTION W/PHACO Left 03/27/2012   Procedure: CATARACT EXTRACTION PHACO AND INTRAOCULAR LENS PLACEMENT (IOC);  Surgeon: Tonny Branch, MD;  Location: [facility];  Service: Ophthalmology;  Laterality: Left;  CDE=10.91  . cataract surgery    . COLONOSCOPY  12/08/2011    Procedure: COLONOSCOPY;  Surgeon: Daneil Dolin, MD;  [facility];  Service: Endoscopy;  Laterality: N/A;  11:15 AM  . COLONOSCOPY WITH PROPOFOL N/A 08/31/2019   Procedure: COLONOSCOPY WITH PROPOFOL;  Surgeon: Harvel Quale, MD;  [facility];  Service: Gastroenterology;  Laterality: N/A;  815  . CYST EXCISION Right July 2015   Right Preauricular area  . ESOPHAGOGASTRODUODENOSCOPY (EGD) WITH PROPOFOL N/A 08/31/2019   Procedure: ESOPHAGOGASTRODUODENOSCOPY (EGD) WITH PROPOFOL;  Surgeon: Harvel Quale, MD;  [facility];  Service: Gastroenterology;  Laterality: N/A;  . PR VEIN BYPASS GRAFT,AORTO-FEM-POP    . right ankle otif    . right femoral to above the knee popliteal bypass    . right femur fracture    . right inguinal herniorraphy      Home Medications:  Allergies as of 10/23/2019      Reactions   Aspirin Other (See Comments)   stomach upset      Medication List       Accurate as of October 23, 2019  3:06 PM. If you have any questions, ask your nurse or doctor.        acetaminophen 500 MG tablet Commonly known as: TYLENOL Take 1 tablet (500 mg total) by mouth every 6 (six) hours as needed.   amLODipine 10 MG tablet Commonly known as: NORVASC Take 10 mg by mouth daily.   amoxicillin-clavulanate 875-125 MG tablet Commonly known  as: Augmentin Take 1 tablet by mouth 2 (two) times daily. One po bid x 7 days   fluticasone 50 MCG/ACT nasal spray Commonly known as: FLONASE Place 1 spray into both nostrils daily.   gabapentin 100 MG capsule Commonly known as: NEURONTIN Take 200 mg by mouth at bedtime.   HYDROcodone-acetaminophen 5-325 MG tablet Commonly known as: Norco Take 1 tablet by mouth every 8 (eight) hours as needed for moderate pain.   losartan 50 MG tablet Commonly known as: COZAAR Take 50 mg by mouth daily.   mirtazapine 15 MG tablet Commonly known as: REMERON Take 15 mg by mouth at bedtime.     oxybutynin 10 MG 24 hr tablet Commonly known as: DITROPAN-XL Take 10 mg by mouth at bedtime.   Pletal 100 MG tablet Generic drug: cilostazol Take 100 mg by mouth 2 (two) times daily.   risedronate 35 MG tablet Commonly known as: ACTONEL Take 35 mg by mouth every Thursday. with water on empty stomach, nothing by mouth or lie down for next 30 minutes.   simvastatin 20 MG tablet Commonly known as: ZOCOR Take 20 mg by mouth at bedtime.   traMADol 50 MG tablet Commonly known as: ULTRAM Take 50 mg by mouth 2 (two) times daily as needed.       Allergies:  Allergies  Allergen Reactions  . Aspirin Other (See Comments)    stomach upset    Family History: Family History  Problem Relation Age of Onset  . Hypertension Mother   . Heart attack Mother   . Stroke Mother   . Colon cancer Neg Hx     Social History:  reports that she quit smoking about 4 months ago. Her smoking use included cigarettes. She has a 104.00 pack-year smoking history. She has never used smokeless tobacco. She reports that she does not drink alcohol and does not use drugs.  ROS: All other review of systems were reviewed and are negative except what is noted above in HPI  Physical Exam: BP 134/84   Pulse (!) 128   Temp 97.8 F (36.6 C)   Ht 5\' 1"  (1.549 m)   Wt 74 lb 15.4 oz (34 kg)   BMI 14.16 kg/m   Constitutional:  Alert and oriented, No acute distress. HEENT: [place] AT, moist mucus membranes.  Trachea midline, no masses. Cardiovascular: No clubbing, cyanosis, or edema. Respiratory: Normal respiratory effort, no increased work of breathing. GI: Abdomen is soft, nontender, nondistended, no abdominal masses GU: No CVA tenderness.  Lymph: No cervical or inguinal lymphadenopathy. Skin: No rashes, bruises or suspicious lesions. Neurologic: Grossly intact, no focal deficits, moving all 4 extremities. Psychiatric: Normal mood and affect.  Laboratory Data: Lab Results  Component Value Date   WBC 15.7  (H) 10/11/2019   HGB 10.2 (L) 10/11/2019   HCT 33.2 (L) 10/11/2019   MCV 79.8 (L) 10/11/2019   PLT 536 (H) 10/11/2019    Lab Results  Component Value Date   CREATININE 0.82 10/11/2019    No results found for: PSA  No results found for: TESTOSTERONE  No results found for: HGBA1C  Urinalysis    Component Value Date/Time   COLORURINE STRAW (A) 10/11/2019 1432   APPEARANCEUR [place] (A) 10/23/2019 1453   LABSPEC 1.023 10/11/2019 1432   PHURINE 5.0 10/11/2019 1432   GLUCOSEU Negative 10/23/2019 [place] 10/11/2019 1432   HGBUR small 12/05/2006 1330   BILIRUBINUR Negative 10/23/2019 [place] 10/11/2019 1432   PROTEINUR 2+ (  A) 10/23/2019 1453   PROTEINUR NEGATIVE 10/11/2019 1432   UROBILINOGEN 0.2 07/23/2010 2045   NITRITE Negative 10/23/2019 1453   NITRITE NEGATIVE 10/11/2019 1432   LEUKOCYTESUR Negative 10/23/2019 [place] 10/11/2019 1432    Lab Results  Component Value Date   LABMICR See below: 10/23/2019   WBCUA 0-5 10/23/2019   LABEPIT 0-10 10/23/2019   BACTERIA Few 10/23/2019    Pertinent Imaging: PET 10/01/2019: Images reviewed and discussed with the patient No results found for this or any previous visit.  No results found for this or any previous visit.  No results found for this or any previous visit.  No results found for this or any previous visit.  No results found for this or any previous visit.  No results found for this or any previous visit.  No results found for this or any previous visit.  No results found for this or any previous visit.   Assessment & Plan:    1. Renal mass -We discussed the natural of renal masses and the 80/20 percent malignant potential of these masses. We will await her lymph node biopsy prior to discussion of any treatemtns for her renal mass - Urinalysis, Routine w reflex microscopic   No follow-ups on file.  Nicolette Bang, MD  [facility]  [place]

## 2019-10-23 NOTE — Patient Instructions (Signed)
[place] at [facility] Discharge Instructions  You were seen today by Dr. Delton Coombes. He went over your recent results. You will be sent back to Dr. Arnoldo Morale to have your biopsy done. Dr. Delton Coombes will see you back after the biopsy for follow up.   Thank you for choosing [place] at [facility] to provide your oncology and hematology care.  To afford each patient quality time with our provider, please arrive at least 15 minutes before your scheduled appointment time.   If you have a lab appointment with the [place] please come in thru the Main Entrance and check in at the main information desk  You need to re-schedule your appointment should you arrive 10 or more minutes late.  We strive to give you quality time with our providers, and arriving late affects you and other patients whose appointments are after yours.  Also, if you no show three or more times for appointments you may be dismissed from the clinic at the providers discretion.     Again, thank you for choosing [facility].  Our hope is that these requests will decrease the amount of time that you wait before being seen by our physicians.       _____________________________________________________________  Should you have questions after your visit to [facility], please contact our office at [phone] between the hours of 8:00 a.m. and 4:30 p.m.  Voicemails left after 4:00 p.m. will not be returned until the following business day.  For prescription refill requests, have your pharmacy contact our office and allow 72 hours.    Cancer Center Support Programs:   > Cancer Support Group  2nd Tuesday of the month 1pm-2pm, Journey Room

## 2019-10-24 DIAGNOSIS — E46 Unspecified protein-calorie malnutrition: Secondary | ICD-10-CM | POA: Diagnosis not present

## 2019-10-24 DIAGNOSIS — C787 Secondary malignant neoplasm of liver and intrahepatic bile duct: Secondary | ICD-10-CM | POA: Diagnosis not present

## 2019-10-24 DIAGNOSIS — C7800 Secondary malignant neoplasm of unspecified lung: Secondary | ICD-10-CM | POA: Diagnosis not present

## 2019-10-24 DIAGNOSIS — Z515 Encounter for palliative care: Secondary | ICD-10-CM | POA: Diagnosis not present

## 2019-10-24 DIAGNOSIS — C7951 Secondary malignant neoplasm of bone: Secondary | ICD-10-CM | POA: Diagnosis not present

## 2019-10-24 DIAGNOSIS — N2889 Other specified disorders of kidney and ureter: Secondary | ICD-10-CM | POA: Diagnosis not present

## 2019-11-02 ENCOUNTER — Other Ambulatory Visit: Payer: Self-pay | Admitting: Physician Assistant

## 2019-11-05 ENCOUNTER — Other Ambulatory Visit: Payer: Self-pay

## 2019-11-05 ENCOUNTER — Ambulatory Visit (HOSPITAL_COMMUNITY)
Admission: RE | Admit: 2019-11-05 | Discharge: 2019-11-05 | Disposition: A | Discharge status: Home / Self Care | Payer: Medicare HMO | Source: Ambulatory Visit | Attending: Hematology | Admitting: Hematology

## 2019-11-05 DIAGNOSIS — Z87891 Personal history of nicotine dependence: Secondary | ICD-10-CM | POA: Insufficient documentation

## 2019-11-05 DIAGNOSIS — I739 Peripheral vascular disease, unspecified: Secondary | ICD-10-CM | POA: Insufficient documentation

## 2019-11-05 DIAGNOSIS — Z8553 Personal history of malignant neoplasm of renal pelvis: Secondary | ICD-10-CM | POA: Diagnosis not present

## 2019-11-05 DIAGNOSIS — E785 Hyperlipidemia, unspecified: Secondary | ICD-10-CM | POA: Insufficient documentation

## 2019-11-05 DIAGNOSIS — J449 Chronic obstructive pulmonary disease, unspecified: Secondary | ICD-10-CM | POA: Diagnosis not present

## 2019-11-05 DIAGNOSIS — Z79899 Other long term (current) drug therapy: Secondary | ICD-10-CM | POA: Insufficient documentation

## 2019-11-05 DIAGNOSIS — C229 Malignant neoplasm of liver, not specified as primary or secondary: Secondary | ICD-10-CM | POA: Diagnosis not present

## 2019-11-05 DIAGNOSIS — Z8249 Family history of ischemic heart disease and other diseases of the circulatory system: Secondary | ICD-10-CM | POA: Insufficient documentation

## 2019-11-05 DIAGNOSIS — I10 Essential (primary) hypertension: Secondary | ICD-10-CM | POA: Diagnosis not present

## 2019-11-05 DIAGNOSIS — K769 Liver disease, unspecified: Secondary | ICD-10-CM | POA: Diagnosis not present

## 2019-11-05 DIAGNOSIS — C227 Other specified carcinomas of liver: Secondary | ICD-10-CM | POA: Diagnosis not present

## 2019-11-05 DIAGNOSIS — K7689 Other specified diseases of liver: Secondary | ICD-10-CM | POA: Diagnosis not present

## 2019-11-05 DIAGNOSIS — R160 Hepatomegaly, not elsewhere classified: Secondary | ICD-10-CM | POA: Diagnosis not present

## 2019-11-05 LAB — CBC
HCT: 33.2 % — ABNORMAL LOW (ref 36.0–46.0)
Hemoglobin: 9.8 g/dL — ABNORMAL LOW (ref 12.0–15.0)
MCH: 23.2 pg — ABNORMAL LOW (ref 26.0–34.0)
MCHC: 29.5 g/dL — ABNORMAL LOW (ref 30.0–36.0)
MCV: 78.5 fL — ABNORMAL LOW (ref 80.0–100.0)
Platelets: 524 10*3/uL — ABNORMAL HIGH (ref 150–400)
RBC: 4.23 MIL/uL (ref 3.87–5.11)
RDW: 17.3 % — ABNORMAL HIGH (ref 11.5–15.5)
WBC: 14.2 10*3/uL — ABNORMAL HIGH (ref 4.0–10.5)
nRBC: 0 % (ref 0.0–0.2)

## 2019-11-05 LAB — PROTIME-INR
INR: 1.1 (ref 0.8–1.2)
Prothrombin Time: 13.9 seconds (ref 11.4–15.2)

## 2019-11-05 MED ORDER — SODIUM CHLORIDE 0.9 % IV SOLN: INTRAVENOUS | Status: DC

## 2019-11-05 MED ORDER — GELATIN ABSORBABLE 12-7 MM EX MISC
CUTANEOUS | Status: AC
  Filled 2019-11-05: qty 1

## 2019-11-05 MED ORDER — FENTANYL CITRATE (PF) 100 MCG/2ML IJ SOLN
INTRAMUSCULAR | Status: DC
Start: 2019-11-05 — End: 2019-11-06
  Filled 2019-11-05: qty 2

## 2019-11-05 MED ORDER — MIDAZOLAM HCL 2 MG/2ML IJ SOLN
INTRAMUSCULAR | Status: AC
  Filled 2019-11-05: qty 2

## 2019-11-05 MED ORDER — FENTANYL CITRATE (PF) 100 MCG/2ML IJ SOLN
INTRAMUSCULAR | Status: AC | PRN
  Administered 2019-11-05 (×2): 25 ug via INTRAVENOUS

## 2019-11-05 MED ORDER — MIDAZOLAM HCL 2 MG/2ML IJ SOLN
INTRAMUSCULAR | Status: AC | PRN
  Administered 2019-11-05 (×2): 0.5 mg via INTRAVENOUS

## 2019-11-05 MED ORDER — LIDOCAINE HCL (PF) 1 % IJ SOLN
INTRAMUSCULAR | Status: AC
  Filled 2019-11-05: qty 30

## 2019-11-05 NOTE — Discharge Instructions (Addendum)
Liver Biopsy, Care After These instructions give you information on caring for yourself after your procedure. Your doctor may also give you more specific instructions. Call your doctor if you have any problems or questions after your procedure. What can I expect after the procedure? After the procedure, it is common to have:  Pain and soreness where the biopsy was done.  Bruising around the area where the biopsy was done.  Sleepiness and be tired for a few days. Follow these instructions at home: Medicines  Take over-the-counter and prescription medicines only as told by your doctor.  If you were prescribed an antibiotic medicine, take it as told by your doctor. Do not stop taking the antibiotic even if you start to feel better.  Do not take medicines such as aspirin and ibuprofen. These medicines can thin your blood. Do not take these medicines unless your doctor tells you to take them.  If you are taking prescription pain medicine, take actions to prevent or treat constipation. Your doctor may recommend that you: ? Drink enough fluid to keep your pee (urine) clear or pale yellow. ? Take over-the-counter or prescription medicines. ? Eat foods that are high in fiber, such as fresh fruits and vegetables, whole grains, and beans. ? Limit foods that are high in fat and processed sugars, such as fried and sweet foods. Caring for your cut  Follow instructions from your doctor about how to take care of your cuts from surgery (incisions). Make sure you: ? Wash your hands with soap and water before you change your bandage (dressing). If you cannot use soap and water, use hand sanitizer. ? Change your bandage as told by your doctor. ? Leave stitches (sutures), skin glue, or skin tape (adhesive) strips in place. They may need to stay in place for 2 weeks or longer. If tape strips get loose and curl up, you may trim the loose edges. Do not remove tape strips completely unless your doctor says it is  okay.  Check your cuts every day for signs of infection. Check for: ? Redness, swelling, or more pain. ? Fluid or blood. ? Pus or a bad smell. ? Warmth.  Do not take baths, swim, or use a hot tub until your doctor says it is okay to do so. Activity   Rest at home for 1-2 days or as told by your doctor. ? Avoid sitting for a long time without moving. Get up to take short walks every 1-2 hours.  Return to your normal activities as told by your doctor. Ask what activities are safe for you.  Do not do these things in the first 24 hours: ? Drive. ? Use machinery. ? Take a bath or shower.  Do not lift more than 10 pounds (4.5 kg) or play contact sports for the first 2 weeks. General instructions   Do not drink alcohol in the first week after the procedure.  Have someone stay with you for at least 24 hours after the procedure.  Get your test results. Ask your doctor or the department that is doing the test: ? When will my results be ready? ? How will I get my results? ? What are my treatment options? ? What other tests do I need? ? What are my next steps?  Keep all follow-up visits as told by your doctor. This is important. Contact a doctor if:  A cut bleeds and leaves more than just a small spot of blood.  A cut is red, puffs up (  swells), or hurts more than before.  Fluid or something else comes from a cut.  A cut smells bad.  You have a fever or chills. Get help right away if:  You have swelling, bloating, or pain in your belly (abdomen).  You get dizzy or faint.  You have a rash.  You feel sick to your stomach (nauseous) or throw up (vomit).  You have trouble breathing, feel short of breath, or feel faint.  Your chest hurts.  You have problems talking or seeing.  You have trouble with your balance or moving your arms or legs. Summary  After the procedure, it is common to have pain, soreness, bruising, and tiredness.  Your doctor will tell you how to  take care of yourself at home. Change your bandage, take your medicines, and limit your activities as told by your doctor.  Call your doctor if you have symptoms of infection. Get help right away if your belly swells, your cut bleeds a lot, or you have trouble talking or breathing. This information is not intended to replace advice given to you by your health care provider. Make sure you discuss any questions you have with your health care provider. Document Revised: 01/14/2017 Document Reviewed: 01/14/2017 Elsevier Patient Education  2020 Elsevier Inc. Moderate Conscious Sedation, Adult Sedation is the use of medicines to promote relaxation and relieve discomfort and anxiety. Moderate conscious sedation is a type of sedation. Under moderate conscious sedation, you are less alert than normal, but you are still able to respond to instructions, touch, or both. Moderate conscious sedation is used during short medical and dental procedures. It is milder than deep sedation, which is a type of sedation under which you cannot be easily woken up. It is also milder than general anesthesia, which is the use of medicines to make you unconscious. Moderate conscious sedation allows you to return to your regular activities sooner. Tell a health care provider about:  Any allergies you have.  All medicines you are taking, including vitamins, herbs, eye drops, creams, and over-the-counter medicines.  Use of steroids (by mouth or creams).  Any problems you or family members have had with sedatives and anesthetic medicines.  Any blood disorders you have.  Any surgeries you have had.  Any medical conditions you have, such as sleep apnea.  Whether you are pregnant or may be pregnant.  Any use of cigarettes, alcohol, marijuana, or street drugs. What are the risks? Generally, this is a safe procedure. However, problems may occur, including:  Getting too much medicine (oversedation).  Nausea.  Allergic  reaction to medicines.  Trouble breathing. If this happens, a breathing tube may be used to help with breathing. It will be removed when you are awake and breathing on your own.  Heart trouble.  Lung trouble. What happens before the procedure? Staying hydrated Follow instructions from your health care provider about hydration, which may include:  Up to 2 hours before the procedure - you may continue to drink clear liquids, such as water, clear fruit juice, black coffee, and plain tea. Eating and drinking restrictions Follow instructions from your health care provider about eating and drinking, which may include:  8 hours before the procedure - stop eating heavy meals or foods such as meat, fried foods, or fatty foods.  6 hours before the procedure - stop eating light meals or foods, such as toast or cereal.  6 hours before the procedure - stop drinking milk or drinks that contain milk.  2   hours before the procedure - stop drinking clear liquids. Medicine Ask your health care provider about:  Changing or stopping your regular medicines. This is especially important if you are taking diabetes medicines or blood thinners.  Taking medicines such as aspirin and ibuprofen. These medicines can thin your blood. Do not take these medicines before your procedure if your health care provider instructs you not to.  Tests and exams  You will have a physical exam.  You may have blood tests done to show: ? How well your kidneys and liver are working. ? How well your blood can clot. General instructions  Plan to have someone take you home from the hospital or clinic.  If you will be going home right after the procedure, plan to have someone with you for 24 hours. What happens during the procedure?  An IV tube will be inserted into one of your veins.  Medicine to help you relax (sedative) will be given through the IV tube.  The medical or dental procedure will be performed. What  happens after the procedure?  Your blood pressure, heart rate, breathing rate, and blood oxygen level will be monitored often until the medicines you were given have worn off.  Do not drive for 24 hours. This information is not intended to replace advice given to you by your health care provider. Make sure you discuss any questions you have with your health care provider. Document Revised: 12/17/2016 Document Reviewed: 04/26/2015 Elsevier Patient Education  2020 Elsevier Inc.  

## 2019-11-05 NOTE — H&P (Signed)
Chief Complaint: Patient was seen in consultation today for liver lesion/biopsy.  Referring Physician(s): Firefighter  Supervising Physician: Corrie Mckusick  Patient Status: [facility] - Out-pt  History of Present Illness: Alicia [name] is a 79 y.o. female with a past medical history of hypertension, hyperlipidemia, PVD, migraines, COPD, PUD, IBS, cataracts, depression, and anxiety. She was found to have a right renal mass suspicious for metastatic disease. Further work-up revealed liver lesions as well. She was referred to oncology for further management who recommended IR consult for possible biopsy for tissue diagnosis.  CT chest/abdomen/pelvis 09/04/2019: 1. Numerous small bilateral pulmonary nodules along with a large dense rind of tumor/adenopathy surrounding the abdominal aorta and elevating the aorta away from the spine, as well as a heterogeneous 5 cm right renal mass, pathologically enlarged prevascular adenopathy in the chest, probable enlarged left axillary lymph node, and enlarged central mesenteric lymph node. Top differential diagnostic considerations include metastatic right renal cell carcinoma, or possibly lymphoma with involvement of the right kidney and either lymphomatous or infectious involvement of the lungs. 2. Nonspecific new 1.0 cm right hepatic lobe hypodense lesion (new compared to the 2012 exam). 3. Subcutaneous and mesenteric edema with a small amount of ascites. Cachexia noted. 4. 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 Advanced atherosclerosis. 5. Other imaging findings of potential clinical significance: Aortic Atherosclerosis (ICD10-I70.0). Coronary atherosclerosis. Small type 1 hiatal hernia. 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 Progressive cystic lesions in the kidneys, some of which are complex. Multilevel lumbar impingement.  IR consulted by  Dr. Delton Coombes for possible image-guided liver lesion biopsy. Patient awake and alert laying in bed. Complains of RUQ pain, rated 8/10 at this time- stable per notes. Denies N/V associated with abdominal pain. Denies fever, chills, chest pain, dyspnea, or headache.   Past Medical History:  Diagnosis Date  . Allergic rhinitis   . Anxiety   . Cataract   . COPD (chronic obstructive pulmonary disease) ([place])   . Depression   . DeQuervain's disease (tenosynovitis)   . High cholesterol   . History of kidney cancer   . Hyperlipidemia   . Hypertension   . Irritable bowel syndrome with constipation   . Knee fracture   . Low back pain   . Migraines   . Osteoarthritis   . Peptic ulcer disease   . PVD (peripheral vascular disease) ([place])   . Right rotator cuff tear     Past Surgical History:  Procedure Laterality Date  . back tumor    . BIOPSY  08/31/2019   Procedure: BIOPSY;  Surgeon: Harvel Quale, MD;  Location: [facility] ENDO SUITE;  Service: Gastroenterology;;  . breast tumor    . CATARACT EXTRACTION W/PHACO Right 03/13/2012   Procedure: CATARACT EXTRACTION PHACO AND INTRAOCULAR LENS PLACEMENT (IOC);  Surgeon: Tonny Branch, MD;  Location: [facility] ORS;  Service: Ophthalmology;  Laterality: Right;  CDE:15.51  . CATARACT EXTRACTION W/PHACO Left 03/27/2012   Procedure: CATARACT EXTRACTION PHACO AND INTRAOCULAR LENS PLACEMENT (IOC);  Surgeon: Tonny Branch, MD;  Location: [facility] ORS;  Service: Ophthalmology;  Laterality: Left;  CDE=10.91  . cataract surgery    . COLONOSCOPY  12/08/2011   Procedure: COLONOSCOPY;  Surgeon: Daneil Dolin, MD;  Location: [facility] END[facility] SU[facility];  Service: Endoscopy;  Laterality: N/A;  11:15 AM  . COLONOSCOPY WITH PROPOFOL N/A 08/31/2019   Procedure: COLONOSCOPY WITH PROPOFOL;  Surgeon: Harvel Quale, MD;  Location: [facility]  ENDO SUITE;  Service: Gastroenterology;  Laterality: N/A;  815  . CYST EXCISION Right July 2015   Right Preauricular area  .  ESOPHAGOGASTRODUODENOSCOPY (EGD) WITH PROPOFOL N/A 08/31/2019   Procedure: ESOPHAGOGASTRODUODENOSCOPY (EGD) WITH PROPOFOL;  Surgeon: Harvel Quale, MD;  Location: [facility];  Service: Gastroenterology;  Laterality: N/A;  . PR VEIN BYPASS GRAFT,AORTO-FEM-POP    . right ankle otif    . right femoral to above the knee popliteal bypass    . right femur fracture    . right inguinal herniorraphy      Allergies: Aspirin  Medications: Prior to Admission medications   Medication Sig Start Date End Date Taking? Authorizing Provider  amLODipine (NORVASC) 10 MG tablet Take 10 mg by mouth daily.   Yes [provider]  cilostazol (PLETAL) 100 MG tablet Take 100 mg by mouth 2 (two) times daily.    Yes [provider]  Ensure (ENSURE) Take 237 mLs by mouth 2 (two) times daily.   Yes [provider]  gabapentin (NEURONTIN) 100 MG capsule Take 200 mg by mouth at bedtime.    Yes [provider]  HYDROcodone-acetaminophen (NORCO) 5-325 MG tablet Take 1 tablet by mouth every 8 (eight) hours as needed for moderate pain. 10/08/19  Yes Derek Jack, MD  losartan (COZAAR) 50 MG tablet Take 50 mg by mouth daily.    Yes [provider]  mirtazapine (REMERON) 15 MG tablet Take 15 mg by mouth at bedtime.     Yes [provider]  oxybutynin (DITROPAN-XL) 10 MG 24 hr tablet Take 10 mg by mouth at bedtime.   Yes [provider]  risedronate (ACTONEL) 35 MG tablet Take 35 mg by mouth every Thursday. with water on empty stomach, nothing by mouth or lie down for next 30 minutes.   Yes [provider]  simvastatin (ZOCOR) 20 MG tablet Take 20 mg by mouth at bedtime.     Yes [provider]  traMADol (ULTRAM) 50 MG tablet Take 50 mg by mouth 2 (two) times daily.  05/11/19  Yes [provider]  acetaminophen (TYLENOL) 500 MG tablet Take 1 tablet (500 mg total) by mouth every 6 (six) hours as needed. Patient not  taking: Reported on 11/01/2019 07/26/19   Emerson Monte, FNP  amoxicillin-clavulanate (AUGMENTIN) 875-125 MG tablet Take 1 tablet by mouth 2 (two) times daily. One po bid x 7 days Patient not taking: Reported on 11/01/2019 10/11/19   Milton Ferguson, MD     Family History  Problem Relation Age of Onset  . Hypertension Mother   . Heart attack Mother   . Stroke Mother   . Colon cancer Neg Hx     Social History   Socioeconomic History  . Marital status: Single    Spouse name: Not on file  . Number of children: 0  . Years of education: Not on file  . Highest education level: Not on file  Occupational History  . Not on file  Tobacco Use  . Smoking status: Former Smoker    Packs/day: 2.00    Years: 52.00    Pack years: 104.00    Types: Cigarettes    Quit date: 06/23/2019    Years since quitting: 0.3  . Smokeless tobacco: Never Used  . Tobacco comment: pt states she smokes about 3-4 cigarettes a day  Vaping Use  . Vaping Use: Never assessed  Substance and Sexual Activity  . Alcohol use: No    Comment: she  quit 1 year ago  . Drug use: No  . Sexual activity: Not on file  Other Topics Concern  . Not on file  Social History Narrative  . Not on file   Social Determinants of Health   Financial Resource Strain: Low Risk   . Difficulty of Paying Living Expenses: Not very hard  Food Insecurity: No Food Insecurity  . Worried About Charity fundraiser in the Last Year: Never true  . Ran Out of Food in the Last Year: Never true  Transportation Needs: No Transportation Needs  . Lack of Transportation (Medical): No  . Lack of Transportation (Non-Medical): No  Physical Activity: Inactive  . Days of Exercise per Week: 0 days  . Minutes of Exercise per Session: 0 min  Stress: Stress Concern Present  . Feeling of Stress : To some extent  Social Connections: Moderately Isolated  . Frequency of Communication with Friends and Family: Three times a week  . Frequency of Social  Gatherings with Friends and Family: Three times a week  . Attends Religious Services: More than 4 times per year  . Active Member of Clubs or Organizations: No  . Attends Archivist Meetings: Never  . Marital Status: Never married     Review of Systems: A 12 point ROS discussed and pertinent positives are indicated in the HPI above.  All other systems are negative.  Review of Systems  Constitutional: Negative for chills and fever.  Respiratory: Negative for shortness of breath and wheezing.   Cardiovascular: Negative for chest pain and palpitations.  Gastrointestinal: Positive for abdominal pain. Negative for nausea and vomiting.  Neurological: Negative for headaches.  Psychiatric/Behavioral: Negative for behavioral problems and confusion.    Vital Signs: BP 134/77   Pulse (!) 115   Temp (!) 97.4 F (36.3 C) (Oral)   Ht 5\' 1"  (1.549 m)   Wt 74 lb 15.3 oz (34 kg)   SpO2 100%   BMI 14.16 kg/m   Physical Exam Vitals and nursing note reviewed.  Constitutional:      General: She is not in acute distress.    Appearance: Normal appearance.  Cardiovascular:     Rate and Rhythm: Regular rhythm. Tachycardia present.     Heart sounds: Normal heart sounds. No murmur heard.   Pulmonary:     Effort: Pulmonary effort is normal. No respiratory distress.     Breath sounds: Normal breath sounds. No wheezing.  Skin:    General: Skin is warm and dry.  Neurological:     Mental Status: She is alert and oriented to person, place, and time.      MD Evaluation Airway: WNL Heart: WNL Abdomen: WNL Chest/ Lungs: WNL ASA  Classification: 3 Mallampati/Airway Score: Two   Imaging: DG Pelvis 1-2 Views  Result Date: 10/11/2019 CLINICAL DATA:  Right buttock and right femur pain without recent injury. EXAM: PELVIS - 1-2 VIEW COMPARISON:  None. FINDINGS: No acute fracture or dislocation is identified. Patient status post prior fixation of the right femur. Degenerative joint  changes of the visualized lower lumbar spine are noted. IMPRESSION: No acute fracture or dislocation. Electronically Signed   By: Abelardo Diesel M.D.   On: 10/11/2019 13:44   CT Angio Chest PE W and/or Wo Contrast  Result Date: 10/11/2019 CLINICAL DATA:  Pain, tachycardia, renal mass, adenopathy, weight loss EXAM: CT ANGIOGRAPHY CHEST WITH CONTRAST TECHNIQUE: Multidetector CT imaging of the chest was performed using the standard protocol during bolus administration of intravenous contrast.  Multiplanar CT image reconstructions and MIPs were obtained to evaluate the vascular anatomy. CONTRAST:  39mL OMNIPAQUE IOHEXOL 350 MG/ML SOLN COMPARISON:  PET-CT 10/01/2019 and previous FINDINGS: Cardiovascular: Heart size normal. Trace pericardial fluid. The RV is nondilated. Satisfactory opacification of pulmonary arteries noted, and there is no evidence of pulmonary emboli. There is short-segment stenosis at the ostium of the inferior left pulmonary vein. Scattered coronary calcifications. Adequate contrast opacification of the thoracic aorta with no evidence of dissection, aneurysm, or stenosis. There is bovine variant brachiocephalic arch anatomy. Calcified plaque at the origin of the left subclavian artery resulting in short-segment stenosis of possible hemodynamic significance. Scattered calcified aortic plaque. Mediastinum/Nodes: Stable prevascular and right paratracheal adenopathy. Lungs/Pleura: Trace pleural fluid bilaterally. No pneumothorax. Stable scattered pulmonary nodules in all lobes largest 0.8 cm right middle lobe (6:66) some increase in dependent atelectasis posteriorly in both lower lobes. Upper Abdomen: Multiple hepatic cysts. 1.6 cm segment 5 hepatic lesion, previously 1.2. retrocrural and left para-aortic adenopathy as before. No acute findings. Musculoskeletal: Multiple lytic/permeative metastatic lesions in the thoracic spine, ribs, and sternum as previously demonstrated. Mild pathologic compression  deformity of T6, new since 09/04/2019. Stable T5, T9, and T11 compression deformities. Minimally displaced pathologic fracture of right fourth rib laterally. Review of the MIP images confirms the above findings. IMPRESSION: 1. Negative for acute PE or thoracic aortic dissection. 2. Bilateral pulmonary nodules, osseous metastatic disease, mediastinal and retroperitoneal adenopathy as before. 3. Pathologic fractures of right fourth rib and subacute compression fracture of T6 vertebral body. 4. 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 Correlate with upper extremity pressures and any evidence of subclavian steal. 5. Trace bilateral pleural fluid. Aortic Atherosclerosis (ICD10-I70.0). Electronically Signed   By: Lucrezia Europe M.D.   On: 10/11/2019 16:21   MR Brain W Wo Contrast  Result Date: 10/17/2019 CLINICAL DATA:  79 year old female with renal mass with abdominal lymphadenopathy, and suspicion of pulmonary and bony metastatic disease on recent CTA chest. Staging. EXAM: MRI HEAD WITHOUT AND WITH CONTRAST TECHNIQUE: Multiplanar, multiecho pulse sequences of the brain and surrounding structures were obtained without and with intravenous contrast. CONTRAST:  47mL GADAVIST GADOBUTROL 1 MMOL/ML IV SOLN COMPARISON:  PET-CT 10/01/2019. FINDINGS: Brain: Cerebral volume is within normal limits for age. No restricted diffusion to suggest acute infarction. No midline shift, mass effect, evidence of mass lesion, ventriculomegaly, extra-axial collection or acute intracranial hemorrhage. Cervicomedullary junction and pituitary are within normal limits. No abnormal enhancement identified.  No dural thickening. Largely normal for age gray and white matter signal throughout the brain; mild nonspecific scattered white matter T2 and FLAIR hyperintensity, mostly in the anterior frontal lobes. Possible chronic microhemorrhage in the right parietal lobe on series 13, image 35. No other chronic  cerebral blood products. Vascular: Major intracranial vascular flow voids are preserved. Skull and upper cervical spine: There is a roughly 3 cm area of suspicious sclerosis of the right parietal bone (series 10, image 15 and series 18, image 7) with, but this demonstrates only subtle increased signal on DWI (series 7, image 7) and little postcontrast enhancement. Elsewhere in the skull bone marrow signal is within normal limits. Heterogeneous marrow signal in the cervical spine at C3 could be degenerative in nature. However, there is suspicious marrow replacement of the left C4 posterior elements on series 9, image 12. Negative visible cervical spinal cord. Sinuses/Orbits: Postoperative changes to both globes, otherwise negative orbits. Mild paranasal sinus periosteal thickening suspected such as from previous inflammation. Other: Trace  bilateral mastoid fluid. Grossly normal visible internal auditory structures. Scalp and face soft tissues appear negative. IMPRESSION: 1. No metastatic disease to the brain or acute intracranial abnormality identified. 2. 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 Electronically Signed   By: Genevie Ann M.D.   On: 10/17/2019 12:21   DG Chest Portable 1 View  Result Date: 10/11/2019 CLINICAL DATA:  79 year old female presenting with generalized pain and tachycardia. EXAM: PORTABLE CHEST 1 VIEW COMPARISON:  PET CT from 10/01/2019, CT chest from 09/04/2019, and chest radiograph from 09/16/2011 FINDINGS: The heart size and mediastinal contours are within normal limits. Scattered nodular pulmonary opacities with a lower lobe predominance, compatible with pulmonary metastatic lesions visualized on recent comparison chest CT. No new consolidative opacities. No pneumothorax or pleural effusion. Unchanged benign-appearing right glenoid cystic lesion. Aortic atherosclerosis. IMPRESSION: 1. No acute cardiopulmonary process. 2. Similar appearing scattered nodular  opacities compatible with known pulmonary metastases. 3.  Aortic Atherosclerosis (ICD10-I70.0). Electronically Signed   By: Ruthann Cancer MD   On: 10/11/2019 13:53   DG FEMUR, MIN 2 VIEWS RIGHT  Result Date: 10/11/2019 CLINICAL DATA:  Recent injury, hip pain EXAM: RIGHT FEMUR 2 VIEWS COMPARISON:  05/14/2019, 10/11/2019 FINDINGS: Marked osteopenia. Previous right femur intramedullary rod with proximal distal fixation screws. Similar healed fracture of the right mid femur shaft. Right hip appears intact without displaced fracture or malalignment. Visualized right pelvis intact. Degenerative arthritis of the right knee. Peripheral atherosclerosis noted. No focal soft tissue abnormality. IMPRESSION: Previous right femur IM rod fixation for remote healed mid femur fracture. Osteopenia No acute displaced fracture by plain radiography Electronically Signed   By: Jerilynn Mages.  Shick M.D.   On: 10/11/2019 13:47    Labs:  CBC: Recent Labs    08/09/19 1446 09/20/19 1432 10/11/19 1253  WBC 11.2* 12.4* 15.7*  HGB 13.1 10.3* 10.2*  HCT 40.7 34.6* 33.2*  PLT 435* 478* 536*    COAGS: No results for input(s): INR, APTT in the last 8760 hours.  BMP: Recent Labs    08/09/19 1446 09/20/19 1432 10/11/19 1253  NA 137 135 135  K 4.6 4.2 4.1  CL 99 99 99  CO2 26 23 25   GLUCOSE 118* 102* 119*  BUN 14 30* 16  CALCIUM 10.0 9.3 9.1  CREATININE 0.90 0.87 0.82  GFRNONAA  --  >60 >60  GFRAA  --  >60 >60    LIVER FUNCTION TESTS: Recent Labs    08/09/19 1446 09/20/19 1432 10/11/19 1253  BILITOT 0.5 0.4 0.5  AST 8* 12* 11*  ALT 3* 8 8  ALKPHOS  --  79 95  PROT 7.8 7.9 7.2  ALBUMIN  --  3.3* 2.9*     Assessment and Plan:  Liver lesion concerning for metastatic disease. Plan for image-guided liver lesion biopsy today in IR. Patient is NPO. Afebrile. She does not take blood thinners. INR pending.  Risks and benefits discussed with the patient including, but not limited to bleeding, infection,  damage to adjacent structures or low yield requiring additional tests. All of the patient's questions were answered, patient is agreeable to proceed. Consent signed and in chart.   Thank you for this interesting consult.  I greatly enjoyed meeting Delsa P Riebe and look forward to participating in their care.  A copy of this report was sent to the requesting provider on this date.  Electronically Signed: Earley Abide, PA-C 11/05/2019, 12:13 PM   I spent a total of 30 Minutes in face  to face in clinical consultation, greater than 50% of which was counseling/coordinating care for liver lesion/biopsy.

## 2019-11-05 NOTE — Procedures (Signed)
Interventional Radiology Procedure Note  Procedure: US guided biopsy of liver mass, right liver, FDG avid on PET Complications: None EBL: None Recommendations: - Bedrest 2 hours.   - Routine wound care - Follow up pathology - Advance diet   Signed,  Corrie Mckusick, DO

## 2019-11-07 LAB — SURGICAL PATHOLOGY

## 2019-11-11 DIAGNOSIS — I10 Essential (primary) hypertension: Secondary | ICD-10-CM | POA: Diagnosis not present

## 2019-11-11 DIAGNOSIS — J449 Chronic obstructive pulmonary disease, unspecified: Secondary | ICD-10-CM | POA: Diagnosis not present

## 2019-11-12 ENCOUNTER — Inpatient Hospital Stay (HOSPITAL_BASED_OUTPATIENT_CLINIC_OR_DEPARTMENT_OTHER): Payer: Medicare HMO | Admitting: Hematology

## 2019-11-12 ENCOUNTER — Other Ambulatory Visit (HOSPITAL_COMMUNITY): Payer: Self-pay | Admitting: *Deleted

## 2019-11-12 ENCOUNTER — Other Ambulatory Visit: Payer: Self-pay

## 2019-11-12 VITALS — BP 126/78 | HR 120 | Temp 97.0°F | Resp 18 | Wt 71.9 lb

## 2019-11-12 DIAGNOSIS — R69 Illness, unspecified: Secondary | ICD-10-CM | POA: Diagnosis not present

## 2019-11-12 DIAGNOSIS — Z7189 Other specified counseling: Secondary | ICD-10-CM | POA: Diagnosis not present

## 2019-11-12 DIAGNOSIS — R634 Abnormal weight loss: Secondary | ICD-10-CM | POA: Diagnosis not present

## 2019-11-12 DIAGNOSIS — R918 Other nonspecific abnormal finding of lung field: Secondary | ICD-10-CM

## 2019-11-12 DIAGNOSIS — C641 Malignant neoplasm of right kidney, except renal pelvis: Secondary | ICD-10-CM | POA: Diagnosis not present

## 2019-11-12 DIAGNOSIS — C7951 Secondary malignant neoplasm of bone: Secondary | ICD-10-CM | POA: Diagnosis not present

## 2019-11-12 DIAGNOSIS — Z5112 Encounter for antineoplastic immunotherapy: Secondary | ICD-10-CM | POA: Diagnosis not present

## 2019-11-12 DIAGNOSIS — I10 Essential (primary) hypertension: Secondary | ICD-10-CM | POA: Diagnosis not present

## 2019-11-12 DIAGNOSIS — C649 Malignant neoplasm of unspecified kidney, except renal pelvis: Secondary | ICD-10-CM | POA: Insufficient documentation

## 2019-11-12 DIAGNOSIS — J449 Chronic obstructive pulmonary disease, unspecified: Secondary | ICD-10-CM | POA: Diagnosis not present

## 2019-11-12 DIAGNOSIS — K769 Liver disease, unspecified: Secondary | ICD-10-CM

## 2019-11-12 DIAGNOSIS — E785 Hyperlipidemia, unspecified: Secondary | ICD-10-CM | POA: Diagnosis not present

## 2019-11-12 DIAGNOSIS — N2889 Other specified disorders of kidney and ureter: Secondary | ICD-10-CM | POA: Diagnosis not present

## 2019-11-12 MED ORDER — OXYCODONE HCL 5 MG PO TABS
5.0000 mg | ORAL_TABLET | Freq: Four times a day (QID) | ORAL | 0 refills | Status: DC | PRN
Start: 2019-11-12 — End: 2019-12-06

## 2019-11-12 MED ORDER — OXYCODONE HCL 5 MG PO TABS
5.0000 mg | ORAL_TABLET | Freq: Once | ORAL | Status: AC
  Administered 2019-11-12: 5 mg via ORAL
  Filled 2019-11-12: qty 1

## 2019-11-12 NOTE — Progress Notes (Signed)
START ON PATHWAY REGIMEN - Renal Cell     A cycle is every 21 days:     Nivolumab      Ipilimumab    A cycle is every 28 days:     Nivolumab   **Always confirm dose/schedule in your pharmacy ordering system**  Patient Characteristics: Stage IV/Metastatic Disease, Clear Cell, First Line, Intermediate or Poor Risk Therapeutic Status: Stage IV/Metastatic Disease Histology: Clear Cell Line of Therapy: First Line Risk Status: Poor Risk Intent of Therapy: Non-Curative / Palliative Intent, Discussed with Patient

## 2019-11-12 NOTE — Patient Instructions (Signed)
[place] [facility] Discharge Instructions  You were seen today by Dr. Delton Coombes. He went over your recent results. The biopsy shows that you have renal cell carcinoma. You will be started on immunotherapy, consisting of Opdivo and Yervoy, given every 3 weeks for 4 cycles, then given once a month. Side effects include watery bowel movements, worsening cough or shortness of breath. You will be prescribed oxycodone for your pain and take 1 tablet every 6 hours, but if the pain is not relieved, then take 2 tablets every 6 hours; do not take if you feel drowsy. Purchase a stool softener over the counter and take daily to prevent constipation. You will be referred to palliative care for your cancer. Dr. Delton Coombes will see you back in 1 week for labs and initiating treatment.   Thank you for choosing [facility] to provide your oncology and hematology care.  To afford each patient quality time with our provider, please arrive at least 15 minutes before your scheduled appointment time.   If you have a lab appointment with the [facility] please come in thru the Main Entrance and check in at the main information desk  You need to re-schedule your appointment should you arrive 10 or more minutes late.  We strive to give you quality time with our providers, and arriving late affects you and other patients whose appointments are after yours.  Also, if you no show three or more times for appointments you may be dismissed from the clinic at the providers discretion.     Again, thank you for choosing [facility].  Our hope is that these requests will decrease the amount of time that you wait before being seen by our physicians.       _____________________________________________________________  Should you have questions after your visit to [facility], please contact our office at [phone] between the hours of 8:00  a.m. and 4:30 p.m.  Voicemails left after 4:00 p.m. will not be returned until the following business day.  For prescription refill requests, have your pharmacy contact our office and allow 72 hours.    Cancer Center Support Programs:   > Cancer Support Group  2nd Tuesday of the month 1pm-2pm, Journey Room

## 2019-11-12 NOTE — Progress Notes (Signed)
Alicia [name], Alicia [name] [id number]   CLINIC:  Medical Oncology/Hematology  PCP:  Rosita Fire, MD [place] / [place] [id number] [phone]   REASON FOR VISIT:  Metastatic renal cell carcinoma.  PRIOR THERAPY: None  NGS Results: Not done  CURRENT THERAPY: Combination immunotherapy with Opdivo and Yervoy  BRIEF ONCOLOGIC HISTORY:  Oncology History   No history exists.    CANCER STAGING: Cancer Staging No matching staging information was found for the patient.  INTERVAL HISTORY:  [name], a 78 y.o. female, returns for routine follow-up of her right renal cell carcinoma. Alicia [name] was last seen on 10/23/2019.   Today Alicia [name] reports having pain in her right chest and back, the sites where Alicia [name] had her biopsy. Alicia [name] is taking tramadol and Norco, but reports that it is not helping the pain at all. Alicia [name] reports having nausea and vomited a small amount this morning.  Alicia [name] denies having history of autoimmune diseases. Alicia [name] is mainly sedentary at home, either sitting or lying down. Alicia [name] also takes care of her dog. Alicia [name] is inquiring about getting her COVID booster and flu shot.   REVIEW OF SYSTEMS:  Review of Systems  Constitutional: Positive for appetite change (75%) and fatigue (depleted).  Cardiovascular: Positive for chest pain (10/10 R chest and back pain).  Gastrointestinal: Positive for diarrhea (occasional), nausea and vomiting.  Neurological: Positive for headaches (occasional).  Psychiatric/Behavioral: Positive for confusion (forgetfulness) and depression.  All other systems reviewed and are negative.   PAST MEDICAL/SURGICAL HISTORY:  Past Medical History:  Diagnosis Date  . Allergic rhinitis   . Anxiety   . Cataract   . COPD (chronic obstructive pulmonary disease) ([place])   . Depression   . DeQuervain's disease (tenosynovitis)   . High cholesterol   . History of kidney cancer   . Hyperlipidemia   . Hypertension    . Irritable bowel syndrome with constipation   . Knee fracture   . Low back pain   . Migraines   . Osteoarthritis   . Peptic ulcer disease   . PVD (peripheral vascular disease) ([place])   . Right rotator cuff tear    Past Surgical History:  Procedure Laterality Date  . back tumor    . BIOPSY  08/31/2019   Procedure: BIOPSY;  Surgeon: Harvel Quale, MD;  Location: [facility];  Service: Gastroenterology;;  . breast tumor    . CATARACT EXTRACTION W/PHACO Right 03/13/2012   Procedure: CATARACT EXTRACTION PHACO AND INTRAOCULAR LENS PLACEMENT (IOC);  Surgeon: Tonny Branch, MD;  Location: [facility];  Service: Ophthalmology;  Laterality: Right;  CDE:15.51  . CATARACT EXTRACTION W/PHACO Left 03/27/2012   Procedure: CATARACT EXTRACTION PHACO AND INTRAOCULAR LENS PLACEMENT (IOC);  Surgeon: Tonny Branch, MD;  Location: [facility];  Service: Ophthalmology;  Laterality: Left;  CDE=10.91  . cataract surgery    . COLONOSCOPY  12/08/2011   Procedure: COLONOSCOPY;  Surgeon: Daneil Dolin, MD;  Location: [facility];  Service: Endoscopy;  Laterality: N/A;  11:15 AM  . COLONOSCOPY WITH PROPOFOL N/A 08/31/2019   Procedure: COLONOSCOPY WITH PROPOFOL;  Surgeon: Harvel Quale, MD;  [facility];  Service: Gastroenterology;  Laterality: N/A;  815  . CYST EXCISION Right July 2015   Right Preauricular area  . ESOPHAGOGASTRODUODENOSCOPY (EGD) WITH PROPOFOL N/A 08/31/2019   Procedure: ESOPHAGOGASTRODUODENOSCOPY (EGD) WITH PROPOFOL;  Surgeon: Harvel Quale, MD;  [facility]: [facility];  Service: Gastroenterology;  Laterality: N/A;  . PR VEIN BYPASS GRAFT,AORTO-FEM-POP    . right ankle otif    . right femoral to above the knee popliteal bypass    . right femur fracture    . right inguinal herniorraphy      SOCIAL HISTORY:  Social History   Socioeconomic History  . Marital status: Single    Spouse name: Not on file  . Number of children: 0  . Years of education: Not on  file  . Highest education level: Not on file  Occupational History  . Not on file  Tobacco Use  . Smoking status: Former Smoker    Packs/day: 2.00    Years: 52.00    Pack years: 104.00    Types: Cigarettes    Quit date: 06/23/2019    Years since quitting: 0.3  . Smokeless tobacco: Never Used  . Tobacco comment: pt states Alicia [name] smokes about 3-4 cigarettes a day  Vaping Use  . Vaping Use: Never assessed  Substance and Sexual Activity  . Alcohol use: No    Comment: Alicia [name] quit 1 year ago  . Drug use: No  . Sexual activity: Not on file  Other Topics Concern  . Not on file  Social History Narrative  . Not on file   Social Determinants of Health   Financial Resource Strain: Low Risk   . Difficulty of Paying Living Expenses: Not very hard  Food Insecurity: No Food Insecurity  . Worried About Charity fundraiser in the Last Year: Never true  . Ran Out of Food in the Last Year: Never true  Transportation Needs: No Transportation Needs  . Lack of Transportation (Medical): No  . Lack of Transportation (Non-Medical): No  Physical Activity: Inactive  . Days of Exercise per Week: 0 days  . Minutes of Exercise per Session: 0 min  Stress: Stress Concern Present  . Feeling of Stress : To some extent  Social Connections: Moderately Isolated  . Frequency of Communication with Friends and Family: Three times a week  . Frequency of Social Gatherings with Friends and Family: Three times a week  . Attends Religious Services: More than 4 times per year  . Active Member of Clubs or Organizations: No  . Attends Archivist Meetings: Never  . Marital Status: Never married  Intimate Partner Violence: Not At Risk  . Fear of Current or Ex-Partner: No  . Emotionally Abused: No  . Physically Abused: No  . Sexually Abused: No    FAMILY HISTORY:  Family History  Problem Relation Age of Onset  . Hypertension Mother   . Heart attack Mother   . Stroke Mother   . Colon cancer Neg Hx      CURRENT MEDICATIONS:  Current Outpatient Medications  Medication Sig Dispense Refill  . amLODipine (NORVASC) 10 MG tablet Take 10 mg by mouth daily.    Marland Kitchen amoxicillin-clavulanate (AUGMENTIN) 875-125 MG tablet Take 1 tablet by mouth 2 (two) times daily. One po bid x 7 days 14 tablet 0  . cilostazol (PLETAL) 100 MG tablet Take 100 mg by mouth 2 (two) times daily.     . Ensure (ENSURE) Take 237 mLs by mouth 2 (two) times daily.    Marland Kitchen gabapentin (NEURONTIN) 100 MG capsule Take 200 mg by mouth at bedtime.     Marland Kitchen losartan (COZAAR) 50 MG tablet Take 50 mg by mouth daily.     . mirtazapine (REMERON) 15 MG tablet Take 15 mg by mouth at bedtime.      Marland Kitchen  oxybutynin (DITROPAN-XL) 10 MG 24 hr tablet Take 10 mg by mouth at bedtime.    . risedronate (ACTONEL) 35 MG tablet Take 35 mg by mouth every Thursday. with water on empty stomach, nothing by mouth or lie down for next 30 minutes.    . simvastatin (ZOCOR) 20 MG tablet Take 20 mg by mouth at bedtime.       No current facility-administered medications for this visit.   Facility-Administered Medications Ordered in Other Visits  Medication Dose Route Frequency Provider Last Rate Last Admin  . fentaNYL (SUBLIMAZE) injection 25-50 mcg  25-50 mcg Intravenous Q5 min PRN Lerry Liner, MD        ALLERGIES:  Allergies  Allergen Reactions  . Aspirin Other (See Comments)    stomach upset    PHYSICAL EXAM:  Performance status (ECOG): 1 - Symptomatic but completely ambulatory  Vitals:   11/12/19 1241  BP: 126/78  Pulse: (!) 120  Resp: 18  Temp: (!) 97 F (36.1 C)   Wt Readings from Last 3 Encounters:  11/12/19 71 lb 13.9 oz (32.6 kg)  11/05/19 74 lb 15.3 oz (34 kg)  10/23/19 74 lb 15.4 oz (34 kg)   Physical Exam Vitals reviewed.  Constitutional:      Appearance: Normal appearance. Alicia [name] is ill-appearing.  Cardiovascular:     Rate and Rhythm: Normal rate and regular rhythm.     Pulses: Normal pulses.     Heart sounds: Normal heart sounds.   Pulmonary:     Effort: Pulmonary effort is normal.     Breath sounds: Normal breath sounds.  Chest:     Chest wall: Tenderness (R posterior chest wall TTP) present.  Neurological:     General: No focal deficit present.     Mental Status: Alicia [name] is alert and oriented to person, place, and time.  Psychiatric:        Mood and Affect: Mood normal.        Behavior: Behavior normal.      LABORATORY DATA:  I have reviewed the labs as listed.  CBC Latest Ref Rng & Units 11/05/2019 10/11/2019 09/20/2019  WBC 4.0 - 10.5 K/uL 14.2(H) 15.7(H) 12.4(H)  Hemoglobin 12.0 - 15.0 g/dL 9.8(L) 10.2(L) 10.3(L)  Hematocrit 36 - 46 % 33.2(L) 33.2(L) 34.6(L)  Platelets 150 - 400 K/uL 524(H) 536(H) 478(H)   CMP Latest Ref Rng & Units 10/11/2019 09/20/2019 08/09/2019  Glucose 70 - 99 mg/dL 119(H) 102(H) 118(H)  BUN 8 - 23 mg/dL 16 30(H) 14  Creatinine 0.44 - 1.00 mg/dL 0.82 0.87 0.90  Sodium 135 - 145 mmol/L 135 135 137  Potassium 3.5 - 5.1 mmol/L 4.1 4.2 4.6  Chloride 98 - 111 mmol/L 99 99 99  CO2 22 - 32 mmol/L 25 23 26   Calcium 8.9 - 10.3 mg/dL 9.1 9.3 10.0  Total Protein 6.5 - 8.1 g/dL 7.2 7.9 7.8  Total Bilirubin 0.3 - 1.2 mg/dL 0.5 0.4 0.5  Alkaline Phos 38 - 126 U/L 95 79 -  AST 15 - 41 U/L 11(L) 12(L) 8(L)  ALT 0 - 44 U/L 8 8 3(L)   Surgical pathology ([id number]) on 11/05/2019: Right liver needle core biopsy: poorly differentiated carcinoma, compatible with renal cell carcinoma.  DIAGNOSTIC IMAGING:  I have independently reviewed the scans and discussed with the patient. MR Brain W Wo Contrast  Result Date: 10/17/2019 CLINICAL DATA:  79 year old female with renal mass with abdominal lymphadenopathy, and suspicion of pulmonary and bony metastatic disease on recent CTA chest. Staging. EXAM:  MRI HEAD WITHOUT AND WITH CONTRAST TECHNIQUE: Multiplanar, multiecho pulse sequences of the brain and surrounding structures were obtained without and with intravenous contrast. CONTRAST:  72mL GADAVIST  GADOBUTROL 1 MMOL/ML IV SOLN COMPARISON:  PET-CT 10/01/2019. FINDINGS: Brain: Cerebral volume is within normal limits for age. No restricted diffusion to suggest acute infarction. No midline shift, mass effect, evidence of mass lesion, ventriculomegaly, extra-axial collection or acute intracranial hemorrhage. Cervicomedullary junction and pituitary are within normal limits. No abnormal enhancement identified.  No dural thickening. Largely normal for age gray and white matter signal throughout the brain; mild nonspecific scattered white matter T2 and FLAIR hyperintensity, mostly in the anterior frontal lobes. Possible chronic microhemorrhage in the right parietal lobe on series 13, image 35. No other chronic cerebral blood products. Vascular: Major intracranial vascular flow voids are preserved. Skull and upper cervical spine: There is a roughly 3 cm area of suspicious sclerosis of the right parietal bone (series 10, image 15 and series 18, image 7) with, but this demonstrates only subtle increased signal on DWI (series 7, image 7) and little postcontrast enhancement. Elsewhere in the skull bone marrow signal is within normal limits. Heterogeneous marrow signal in the cervical spine at C3 could be degenerative in nature. However, there is suspicious marrow replacement of the left C4 posterior elements on series 9, image 12. Negative visible cervical spinal cord. Sinuses/Orbits: Postoperative changes to both globes, otherwise negative orbits. Mild paranasal sinus periosteal thickening suspected such as from previous inflammation. Other: Trace bilateral mastoid fluid. Grossly normal visible internal auditory structures. Scalp and face soft tissues appear negative. IMPRESSION: 1. No metastatic disease to the brain or acute intracranial abnormality identified. 2. 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 Electronically Signed   By: Genevie Ann M.D.   On: 10/17/2019 12:21   [place] CORE BIOPSY  (LIVER)  Result Date: 11/05/2019 INDICATION: 79 year old female with a history of FDG avid right liver lesion, un known metastasis EXAM: IMAGE GUIDED BIOPSY OF RIGHT LIVER MASS MEDICATIONS: None. ANESTHESIA/SEDATION: Moderate (conscious) sedation was employed during this procedure. A total of Versed 1.0 mg and Fentanyl 50 mcg was administered intravenously. Moderate Sedation Time: 13 minutes. The patient's level of consciousness and vital signs were monitored continuously by radiology nursing throughout the procedure under my direct supervision. FLUOROSCOPY TIME:  Ultrasound COMPLICATIONS: None PROCEDURE: Informed written consent was obtained from the patient after a thorough discussion of the procedural risks, benefits and alternatives. All questions were addressed. Maximal Sterile Barrier Technique was utilized including caps, mask, sterile gowns, sterile gloves, sterile drape, hand hygiene and skin antiseptic. A timeout was performed prior to the initiation of the procedure. Ultrasound survey of the right liver lobe performed with images stored and sent to PACs. The right lower thorax/right upper abdomen was prepped with chlorhexidine in a sterile fashion, and a sterile drape was applied covering the operative field. A sterile gown and sterile gloves were used for the procedure. Local anesthesia was provided with 1% Lidocaine. The patient was prepped and draped sterilely and the skin and subcutaneous tissues were generously infiltrated with 1% lidocaine. A 17 gauge introducer needle was then advanced under ultrasound guidance in an intercostal location into the right liver lobe, targeting hypoechoic mass in the subcapsular location. The stylet was removed, and multiple separate 18 gauge core biopsy were retrieved. Samples were placed into formalin for transportation to the lab. The needle was removed, and a final ultrasound image was performed. The patient tolerated the procedure well and  remained  hemodynamically stable throughout. No complications were encountered and no significant blood loss was encounter. IMPRESSION: Status post ultrasound-guided biopsy of right hepatic hypoechoic mass corresponding to the findings of prior PET-CT. Signed, Dulcy Fanny. Dellia Nims, RPVI [facility] Electronically Signed   By: Corrie Mckusick D.O.   On: 11/05/2019 15:20     ASSESSMENT:  1.  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 -MRI of the brain on 10/17/2019 with no metastatic disease. -LDH was elevated at 314.  SPEP was negative. -PET scan on 10/01/2019 shows intense FDG uptake in the inferior pole of the right kidney, bulky retroperitoneal adenopathy, left cervical lymph node, left axilla and left retropectoral region, innumerable bone lesions, solitary FDG avid right hepatic lobe liver metastasis, small pulmonary nodules.  2. Weight loss: -30 to 40 pound weight loss since January of this year. Initially had vomiting which subsided at this time. Alicia [name] does report occasional night sweats. -Colonoscopy on 08/31/2019 shows diverticulosis in the sigmoid colon, descending colon and and ascending colon. 2 nonbleeding colonic angiodysplastic lesions. One 79millimeter polyp in the sigmoid colon. -EGD on 08/31/2019 shows 3 cm hiatal hernia, normal stomach, duodenum.  3. Social/family history: -Quit smoking 2 and half months ago. Alicia [name] lives alone with her dog. -Smoked 1 to 2 packs/day for 60 years. -No family history of malignancies.   PLAN:  1.   Metastatic renal cell carcinoma: -We reviewed the liver biopsy from  11/05/2019. -This showed poorly differentiated carcinoma, consistent with renal cell carcinoma. -We discussed normal prognosis of metastatic kidney cancer with treatment intent in the palliative setting. -We also discussed options including best supportive care in the form of hospice.  Patient would like to try some active treatments before "I give up". -Based on IMDC criteria, Alicia [name] falls under poor prognostic risk group with 4 points. -Because of poor functional status, I think Alicia [name] would better tolerate combination immunotherapy with nivolumab and ipilimumab every 3 weeks for 4 cycles followed by nivolumab maintenance. -We discussed immunotherapy related side effects in detail.  Alicia [name] does not have any history of autoimmune problems. -We will likely start her treatment within the next 1 week.  2. Weight loss: -Her weight has been stable since last visit.  3.   Diffuse body pains: -This is mainly in the right lateral ribs and in the back. -Alicia [name] has tried hydrocodone and tramadol which did not help. -I have sent a prescription for oxycodone 5 mg to be taken every 6 hours as needed.  We will increase the dose to 10 mg if 5 mg is not working. -Alicia [name] will continue stool softeners and MiraLAX.  4.  Microcytic anemia: -This is anemia from chronic inflammation from malignancy. -We will consider checking anemia panel.  5.  Bone metastasis: -We will also talk about denosumab once Alicia [name] tolerates combination immunotherapy well.   Orders placed this encounter:  Orders Placed This Encounter  Procedures  . CBC with Differential/Platelet  . Comprehensive metabolic panel  . Lactate dehydrogenase  . Magnesium  . TSH     Derek Jack, MD [place] [phone]   I, Milinda Antis, am acting as a scribe for Dr. Sanda Linger.  Kinnie Scales  MD, have reviewed the above documentation for accuracy and completeness, and I agree with the above.

## 2019-11-14 NOTE — Patient Instructions (Addendum)
[place] are diagnosed with metastatic renal cell carcinoma.  You will be treated in the clinic every 3 weeks with a combination of immunotherapy drugs.  Those drugs are Opdivo and Yervoy.  You will receive both drugs every 2 weeks for 4 cycles.  Then you will return to the clinic every 4 weeks to receive Opdivo only.  The intent of treatment is to help control your cancer, keep it from spreading further, and to alleviate any symptoms you may be having related to your disease. You will see the doctor regularly throughout treatment.  We will obtain blood work from you prior to every treatment and monitor your results to make sure it is safe to give your treatment. The doctor monitors your response to treatment by the way you are feeling, your blood work, and by obtaining scans periodically.  There will be wait times while you are here for treatment.  It will take about 30 minutes to 1 hour for your lab work to result.  Then there will be wait times while pharmacy mixes your medications.   Nivolumab (Opdivo)  About This Drug  Nivolumab is used to treat cancer. It is given in the vein (IV).   It helps your immune system work properly to target and kill cancer cells. It will take 30 minutes to infuse.    These are some of the most common or important side effects:   Immune Reactions   This medication stimulates your immune system. Your immune system can attack normal organs and tissues in your body, leading to serious or life threatening complications. It is important to notify your healthcare provider right away if you develop any of the following symptoms:   Diarrhea / Intestinal problems (colitis, inflammation of the bowel): Abdominal pain, diarrhea, cramping, mucus or blood in the stool, dark or tar-like stools, fever. Diarrhea means different things to different people. Any increase in your normal bowel patterns can be defined as diarrhea and should be  reported to your healthcare team.  Skin reactions: Report rash, with or without itching (pruritis), sores in your mouth, blistering or peeling skin, as these can become severe and require treatment with corticosteroids.  Lung problems (pneumonitis, inflammation of the lung): New or worsening cough, shortness of breath, trouble breathing, or chest pain.  Liver problems (hepatitis, inflammation of the liver): Yellowing of the skin or eyes, your urine appears dark or brown, pain in your abdomen, bleeding or bruising more easily than normal, or severe nausea and vomiting.   Brain and/or nerve problems: Report any headache, drooping of eyelids, double vision, trouble swallowing, weakness of arms, legs or face, or numbness or tingling in the hands or feet to your healthcare team.  Hormone abnormalities: Immune reactions can affect the pituitary, thyroid, pancreas and adrenal glands, resulting in inflammation of these glands, which can affect their production of certain hormones. Some hormone levels can be monitored with blood work. It is important that you report any changes in how you are feeling to your care team. Symptoms of these hormonal changes can include: headaches, nausea, vomiting, constipation, rapid heart rate, increased sweating, extreme fatigue, weakness, changes in your voice, changes in memory and concentration, increased hunger or thirst, increased urination, weight gain, hair loss, dizziness, feeling cold all the time, and changes in mood or behavior (including irritability, forgetfulness and decreased sex drive).    Eye problems: Report any changes in vision, blurry or double vision, and eye pain or  redness to your healthcare team.  Kidney problems (kidney inflammation or failure): Decreased urine output, blood in the urine, swelling in the ankles, loss of appetite.  Fatigue:  Fatigue is very common during cancer treatment and is an overwhelming feeling of exhaustion that is not usually  relieved by rest. While on cancer treatment, and for a period after, you may need to adjust your schedule to manage fatigue. Plan times to rest during the day and conserve energy for more important activities. Exercise can help combat fatigue; a simple daily walk with a friend can help.  Talk to your healthcare team for helpful tips on dealing with this side effect.  Allergic Reactions:  In some cases, patients can have an allergic reaction to this medication. Signs of a reaction can include: shortness of breath or difficulty breathing, chest pain, rash, flushing or itching or a decrease in blood pressure. If you notice any changes in how you feel during the infusion, let your nurse know immediately. The infusion will be slowed or stopped if this occurs.   Less common, but important side effects can include:   Allogenic Stem Cell Transplant Reactions: Patients who receive this medication before or after having an allogenic stem cell transplant can be at an increased risk of graft vs. host disease, veno-occlusive disease and fever syndrome. You providers will monitor you closely for these side effects.   Reproductive Concerns  Exposure of an unborn child to this medication could cause birth defects, so you should not become pregnant or father a child while on this medication. Even if your menstrual cycle stops or you believe you are not producing sperm, effective birth control is necessary during treatment and for 5 months after stopping treatment. You should not breastfeed while taking this medication or for five months after the end of treatment.   Important Information  . This drug may be present in the saliva, tears, sweat, urine, stool, vomit, semen, and vaginal secretions. Talk to your doctor and/or your nurse about the necessary precautions to take during this time.  Treating Side Effects  . To decrease infection, wash your hands regularly  . Avoid close contact with people who have a cold,  the flu, or other infections.  . Take your temperature as your doctor or nurse tells you, and whenever you feel like you may have a fever  . To help decrease bleeding, use a soft toothbrush. Check with your nurse before using dental floss.  . Be very careful when using knives or tools  . Use an electric shaver instead of a razor  . Ask your doctor or nurse about medicines that are available to help stop or lessen constipation, diarrhea and/or nausea.  . Drink plenty of fluids (a minimum of eight glasses per day is recommended).  . If you are not able to move your bowels, check with your doctor or nurse before you use any enemas, laxatives, or suppositories  . To help with nausea and vomiting, eat small, frequent meals instead of three large meals a day. Choose foods and drinks that are at room temperature. Ask your nurse or doctor about other helpful tips and medicine that is available to help or stop lessen these symptoms.  . If you get diarrhea, eat low-fiber foods that are high in protein and calories and avoid foods that can irritate your digestive tracts or lead to cramping. Ask your nurse or doctor about medicine that can lessen or stop your diarrhea.  . To help with  decreased appetite, eat small, frequent meals  . Eat high caloric food such as pudding, ice cream, yogurt and milkshakes.  . Manage tiredness by pacing your activities for the day. Be sure to include periods of rest between energy-draining activities  . Keeping your pain under control is important to your wellbeing. Please tell your doctor or nurse if you are experiencing pain.  . If you have diabetes, keep good control of your blood sugar level. Tell your nurse or your doctor if your glucose levels are higher or lower than normal  . If you get a rash do not put anything on it unless your doctor or nurse says you may. Keep the area around the rash clean and dry. Ask your doctor for medicine if your rash bothers  you.  . Infusion reactions may occur after your infusion. If this happens, call 911 for emergency care.  Food and Drug Interactions  . There are no known interactions of nivolumab with food or other medications.  . Tell your doctor and pharmacist about all the medicines and dietary supplements (vitamins, minerals, herbs and others) that you are taking at this time. The safety and use of dietary supplements and alternative diets are often not known. Using these might affect your cancer or interfere with your treatment. Until more is known, you should not use dietary supplements or alternative diets without your cancer doctor's help.  When to Call the Doctor  Call your doctor or nurse if you have any of these symptoms and/or any new or unusual symptoms:  . Fever of 100.4 F (38 C) or higher  . Chills  . Easy bleeding or bruising  . Wheezing or trouble breathing  . Dry cough, or cough with yellow, green or bloody mucus  . Confusion and/or agitation  . Hallucinations  . Trouble understanding or speaking  . Blurry vision or changes in your eyesight  . Numbness or lack of strength to your arms, legs, face, or body  . Feeling dizzy or lightheaded  . Loose bowel movements (diarrhea) more than 4 times a day or diarrhea with weakness or lightheadedness  . Nausea that stops you from eating or drinking, and/or that is not relieved by prescribed medicines  . Lasting loss of appetite or rapid weight loss of five pounds in a week  . No bowel movement for 3 days or you feel uncomfortable  . Bad abdominal pain, especially in upper right area  . Fatigue or extreme weakness that interferes with normal activities  . Decreased urine  . Unusual thirst or passing urine often  . Rash that is not relieved by prescribed medicines  . Rash or itching  . Flu-like symptoms: fever, headache, muscle and joint aches, and fatigue (low energy, feeling weak)  . Signs of liver problems: dark  urine, pale bowel movements, bad stomach pain, feeling very tired and weak, unusual itching, or yellowing of the eyes or skin.  . Signs of infusion reaction: fever or shaking chills, flushing, facial swelling, feeling dizzy, headache, trouble breathing, rash, itching, chest tightness, or chest pain.  . If you think you may be pregnant  Reproduction Warnings  . Pregnancy warning: This drug can have harmful effects on the unborn baby. Women of child bearing potential should use effective methods of birth control during your cancer treatment and for at least 5 months after treatment. Let your doctor know right away if you think you may be pregnant.  . Breastfeeding warning: It is not known if this  drug passes into breast milk. For this reason, women should not breast feed during treatment because this drug could enter the breast milk and cause harm to a breast feeding baby.  . Fertility warning: Human fertility studies have not been done with this drug. Talk with your doctor or nurse if you plan to have children. Ask for information on sperm or egg banking.   Altagracia.Kinds        Generic name : Ipilimumab   How Yervoy  Is Given:  Curt Bears is given as an intravenous injection through a vein (IV). It helps your immune system work properly to target and kill cancer cells.  It will take 30 minutes to infuse.   Immune Reactions  This medication stimulates your immune system. Your immune system can attack normal organs and tissues in your body, leading to serious or life threatening complications. It is important to notify your healthcare provider right away if you develop any of the following symptoms:   Diarrhea / Intestinal problems (colitis, inflammation of the bowel): Abdominal pain, diarrhea, cramping, mucus or blood in the stool, dark or tar-like stools, fever. Diarrhea means different things to different people. Any increase in your normal bowel patterns can be defined as diarrhea and should be  reported to your healthcare team.  Bowel obstruction or perforation: Abdominal pain, fever, constipation, bloating and cramping. Any decrease in your normal bowel patterns should be reported.   Skin reactions: Report rash, with or without itching (pruritis), sores in your mouth, blistering or peeling skin, as these can become severe and require treatment with corticosteroids.  Liver problems (hepatitis, inflammation of the liver): Yellowing of the skin or eyes, your urine appears dark or brown, pain in your abdomen, bleeding or bruising more easily than normal, or severe nausea and vomiting.  Brain and/or nerve problems: Report any headache, drooping of eyelids, double vision, trouble swallowing, weakness of arms, legs or face, or numbness or tingling in the hands or feet to your healthcare team.  Eye problems: Report any changes in vision, blurry or double vision, and eye pain or redness.   Lung problems (pneumonitis, inflammation of the lung): New or worsening cough, shortness of breath, trouble breathing, or chest pain.  Kidney problems: (kidney inflammation or failure):  Decreased urine output, blood in the urine, swelling in the ankles, loss of appetite.   Hormone abnormalities: Immune reactions can affect the pituitary, thyroid, pancreas and adrenal glands, resulting in inflammation of these glands, which can affect their production of certain hormones. Some hormone levels can be monitored with blood work. It is important that you report any changes in how you are feeling to your care team. Symptoms of these hormonal changes can include: headaches, nausea, vomiting, constipation, rapid heart rate, increased sweating, extreme fatigue, weakness, changes in your voice, changes in memory and concentration, increased hunger or thirst, increased urination, weight gain, hair loss, dizziness, feeling cold all the time, and changes in mood or behavior (including irritability, forgetfulness and decreased  sex drive).   Infusion-Related Side Effects: The infusion can cause a reaction that may lead to chills, fever, low blood pressure, dizziness, feeling like you are going to pass out and difficulty breathing. Let your nurse know right away if you are experiencing any changes in how you are feeling.   Fatigue  Fatigue is very common during cancer treatment and is an overwhelming feeling of exhaustion that is not usually relieved by rest. While on cancer treatment, and for a period after, you may  need to adjust your schedule to manage fatigue. Plan times to rest during the day and conserve energy for more important activities. Exercise can help combat fatigue; a simple daily walk with a friend can help. Talk to your healthcare team for helpful tips on dealing with this side effect.   Reproductive Concerns  Exposure of an unborn child to this medication could cause birth defects, so you should not become pregnant or father a child while on this medication. Effective birth control is necessary during treatment and for 3 months after the last dose. Even if your menstrual cycle stops or you believe you are not producing sperm, you could still be fertile and conceive. You should not breastfeed while receiving this medication and for 3 months after the last dose.    When to Call the Doctor  Call your doctor or nurse if you have any of these symptoms and/or any new or unusual symptoms:  . Fever of 100.4 F (38 C) or higher  . Chills  . Easy bleeding or bruising  . Wheezing or trouble breathing  . Dry cough, or cough with yellow, green or bloody mucus  . Confusion and/or agitation  . Hallucinations  . Trouble understanding or speaking  . Blurry vision or changes in your eyesight  . Numbness or lack of strength to your arms, legs, face, or body  . Feeling dizzy or lightheaded  . Loose bowel movements (diarrhea) more than 4 times a day or diarrhea with weakness or lightheadedness  . Nausea  that stops you from eating or drinking, and/or that is not relieved by prescribed medicines  . Lasting loss of appetite or rapid weight loss of five pounds in a week  . No bowel movement for 3 days or you feel uncomfortable  . Bad abdominal pain, especially in upper right area  . Fatigue or extreme weakness that interferes with normal activities  . Decreased urine  . Unusual thirst or passing urine often  . Rash that is not relieved by prescribed medicines  . Rash or itching  . Flu-like symptoms: fever, headache, muscle and joint aches, and fatigue (low energy, feeling weak)  . Signs of liver problems: dark urine, pale bowel movements, bad stomach pain, feeling very tired and weak, unusual itching, or yellowing of the eyes or skin.  . Signs of infusion reaction: fever or shaking chills, flushing, facial swelling, feeling dizzy, headache, trouble breathing, rash, itching, chest tightness, or chest pain.  Precautions:  Before starting Yervoy treatment, make sure you tell your doctor about any other medications you are taking (including prescription, over-the-counter, vitamins, herbal remedies, etc.).  Do not receive any kind of immunization or vaccination without your doctor's approval while taking Yervoy. Inform your health care professional if you are pregnant or may be pregnant prior to starting this treatment. Pregnancy category C Curt Bears may be hazardous to the fetus. Women who are pregnant or become pregnant must be advised of the potential hazard to the fetus.) For both men and women: Do not conceive a child (get pregnant) while taking Yervoy. Barrier methods of contraception, such as condoms, are recommended. Discuss with your doctor when you may safely become pregnant or conceive a child after therapy. Do not breast feed while taking this medication.    SELF CARE ACTIVITIES WHILE ON IMMUNOTHERAPY:  Hydration Increase your fluid intake 48 hours prior to treatment and drink at  least 8 to 12 cups (64 ounces) of water/decaffeinated beverages per day after treatment. You can still  have your cup of coffee or soda but these beverages do not count as part of your 8 to 12 cups that you need to drink daily. No alcohol intake.  Medications Continue taking your normal prescription medication as prescribed.  If you start any new herbal or new supplements please let us know first to make sure it is safe.  Infection Prevention Please wash your hands for at least 30 seconds using warm soapy water. Handwashing is the #1 way to prevent the spread of germs. Stay away from sick people or people who are getting over a cold. If you develop respiratory systems such as green/yellow mucus production or productive cough or persistent cough let us know and we will see if you need an antibiotic. It is a good idea to keep a pair of gloves on when going into grocery stores/[employer] to decrease your risk of coming into contact with germs on the carts, etc. Carry alcohol hand gel with you at all times and use it frequently if out in public. If your temperature reaches 100.4 or higher please call the clinic and let us know.  If it is after hours or on the weekend please go to the ER if your temperature is over 100.4.  Please have your own personal thermometer at home to use.    Sex and bodily fluids If you are going to have sex, a condom must be used to protect the person that isn't taking immunotherapy. For a few days after treatment, immunotherapy can be excreted through your bodily fluids.  When using the toilet please close the lid and flush the toilet twice.  Do this for a few day after you have had immunotherapy.   Contraception It is not known for sure whether or not immunotherapy drugs can be passed on through semen or secretions from the vagina. Because of this some doctors advise people to use a barrier method if you have sex during treatment. This applies to vaginal, anal or oral  sex.  Generally, doctors advise a barrier method only for the time you are actually having the treatment and for about a week after your treatment.  Advice like this can be worrying, but this does not mean that you have to avoid being intimate with your partner. You can still have close contact with your partner and continue to enjoy sex.  Animals If you have cats or birds we just ask that you not change the litter or change the cage.  Please have someone else do this for you while you are on immunotherapy.   Food Safety During and After Cancer Treatment Food safety is important for people both during and after cancer treatment. Cancer and cancer treatments, such as chemotherapy, radiation therapy, and stem cell/bone marrow transplantation, often weaken the immune system. This makes it harder for your body to protect itself from foodborne illness, also called food poisoning. Foodborne illness is caused by eating food that contains harmful bacteria, parasites, or viruses.  Foods to avoid Some foods have a higher risk of becoming tainted with bacteria. These include: Marland Kitchen Unwashed fresh fruit and vegetables, especially leafy vegetables that can hide dirt and other contaminants . Raw sprouts, such as alfalfa sprouts . Raw or undercooked beef, especially ground beef, or other raw or undercooked meat and poultry . Fatty, fried, or spicy foods immediately before or after treatment.  These can sit heavy on your stomach and make you feel nauseous. . Raw or undercooked shellfish, such as oysters. . Sushi  and sashimi, which often contain raw fish.  . Unpasteurized beverages, such as unpasteurized fruit juices, raw milk, raw yogurt, or cider . Undercooked eggs, such as soft boiled, over easy, and poached; raw, unpasteurized eggs; or foods made with raw egg, such as homemade raw cookie dough and homemade mayonnaise  Simple steps for food safety  Shop smart. . Do not buy food stored or displayed in an  unclean area. . Do not buy bruised or damaged fruits or vegetables. . Do not buy cans that have cracks, dents, or bulges. . Pick up foods that can spoil at the end of your shopping trip and store them in a cooler on the way home.  Prepare and clean up foods carefully. . Rinse all fresh fruits and vegetables under running water, and dry them with a clean towel or paper towel. . Clean the top of cans before opening them. . After preparing food, wash your hands for 20 seconds with hot water and soap. Pay special attention to areas between fingers and under nails. . Clean your utensils and dishes with hot water and soap. Marland Kitchen Disinfect your kitchen and cutting boards using 1 teaspoon of liquid, unscented bleach mixed into 1 quart of water.    Dispose of old food. . Eat canned and packaged food before its expiration date (the "use by" or "best before" date). . Consume refrigerated leftovers within 3 to 4 days. After that time, throw out the food. Even if the food does not smell or look spoiled, it still may be unsafe. Some bacteria, such as Listeria, can grow even on foods stored in the refrigerator if they are kept for too long.  Take precautions when eating out. . At restaurants, avoid buffets and salad bars where food sits out for a long time and comes in contact with many people. Food can become contaminated when someone with a virus, often a norovirus, or another "bug" handles it. . Put any leftover food in a "to-go" container yourself, rather than having the server do it. And, refrigerate leftovers as soon as you get home. . Choose restaurants that are clean and that are willing to prepare your food as you order it cooked.    Wear comfortable clothing and clothing appropriate for easy access to any Portacath or PICC line. Let us know if there is anything that we can do to make your therapy better!   What to do if you need assistance after hours or on the weekends: CALL [phone].  HOLD on  the line, do not hang up.  You will hear multiple messages but at the end you will be connected with a nurse triage line.  They will contact the doctor if necessary.  Most of the time they will be able to assist you.  Do not call the hospital operator.    I have been informed and understand all of the instructions given to me and have received a copy. I have been instructed to call the clinic [phone] or my family physician as soon as possible for continued medical care, if indicated. I do not have any more questions at this time but understand that I may call the [place] or the Patient Navigator at [phone] during office hours should I have questions or need assistance in obtaining follow-up care.

## 2019-11-15 ENCOUNTER — Other Ambulatory Visit: Payer: Self-pay

## 2019-11-15 ENCOUNTER — Inpatient Hospital Stay (HOSPITAL_BASED_OUTPATIENT_CLINIC_OR_DEPARTMENT_OTHER): Payer: Medicare HMO | Admitting: Hematology

## 2019-11-15 ENCOUNTER — Inpatient Hospital Stay (HOSPITAL_COMMUNITY): Payer: Medicare HMO

## 2019-11-15 VITALS — BP 131/65 | HR 109 | Resp 18

## 2019-11-15 VITALS — BP 108/58 | HR 120 | Temp 96.9°F | Resp 17 | Wt 73.9 lb

## 2019-11-15 DIAGNOSIS — C641 Malignant neoplasm of right kidney, except renal pelvis: Secondary | ICD-10-CM | POA: Diagnosis not present

## 2019-11-15 DIAGNOSIS — N2889 Other specified disorders of kidney and ureter: Secondary | ICD-10-CM | POA: Diagnosis not present

## 2019-11-15 DIAGNOSIS — C649 Malignant neoplasm of unspecified kidney, except renal pelvis: Secondary | ICD-10-CM

## 2019-11-15 DIAGNOSIS — R69 Illness, unspecified: Secondary | ICD-10-CM | POA: Diagnosis not present

## 2019-11-15 DIAGNOSIS — Z5112 Encounter for antineoplastic immunotherapy: Secondary | ICD-10-CM | POA: Diagnosis not present

## 2019-11-15 DIAGNOSIS — R918 Other nonspecific abnormal finding of lung field: Secondary | ICD-10-CM

## 2019-11-15 DIAGNOSIS — R634 Abnormal weight loss: Secondary | ICD-10-CM | POA: Diagnosis not present

## 2019-11-15 DIAGNOSIS — C7951 Secondary malignant neoplasm of bone: Secondary | ICD-10-CM | POA: Diagnosis not present

## 2019-11-15 DIAGNOSIS — E785 Hyperlipidemia, unspecified: Secondary | ICD-10-CM | POA: Diagnosis not present

## 2019-11-15 DIAGNOSIS — J449 Chronic obstructive pulmonary disease, unspecified: Secondary | ICD-10-CM | POA: Diagnosis not present

## 2019-11-15 DIAGNOSIS — K769 Liver disease, unspecified: Secondary | ICD-10-CM | POA: Diagnosis not present

## 2019-11-15 DIAGNOSIS — I10 Essential (primary) hypertension: Secondary | ICD-10-CM | POA: Diagnosis not present

## 2019-11-15 LAB — COMPREHENSIVE METABOLIC PANEL
ALT: 10 U/L (ref 0–44)
AST: 17 U/L (ref 15–41)
Albumin: 2.7 g/dL — ABNORMAL LOW (ref 3.5–5.0)
Alkaline Phosphatase: 129 U/L — ABNORMAL HIGH (ref 38–126)
Anion gap: 13 (ref 5–15)
BUN: 26 mg/dL — ABNORMAL HIGH (ref 8–23)
CO2: 26 mmol/L (ref 22–32)
Calcium: 9.1 mg/dL (ref 8.9–10.3)
Chloride: 98 mmol/L (ref 98–111)
Creatinine, Ser: 1.28 mg/dL — ABNORMAL HIGH (ref 0.44–1.00)
GFR, Estimated: 43 mL/min — ABNORMAL LOW (ref >60)
Glucose, Bld: 124 mg/dL — ABNORMAL HIGH (ref 70–99)
Potassium: 4.6 mmol/L (ref 3.5–5.1)
Sodium: 137 mmol/L (ref 135–145)
Total Bilirubin: 0.6 mg/dL (ref 0.3–1.2)
Total Protein: 7.3 g/dL (ref 6.5–8.1)

## 2019-11-15 LAB — CBC WITH DIFFERENTIAL/PLATELET
Abs Immature Granulocytes: 0.06 10*3/uL (ref 0.00–0.07)
Basophils Absolute: 0 10*3/uL (ref 0.0–0.1)
Basophils Relative: 0 %
Eosinophils Absolute: 0 10*3/uL (ref 0.0–0.5)
Eosinophils Relative: 0 %
HCT: 31 % — ABNORMAL LOW (ref 36.0–46.0)
Hemoglobin: 9.2 g/dL — ABNORMAL LOW (ref 12.0–15.0)
Immature Granulocytes: 1 %
Lymphocytes Relative: 9 %
Lymphs Abs: 1 10*3/uL (ref 0.7–4.0)
MCH: 23.5 pg — ABNORMAL LOW (ref 26.0–34.0)
MCHC: 29.7 g/dL — ABNORMAL LOW (ref 30.0–36.0)
MCV: 79.1 fL — ABNORMAL LOW (ref 80.0–100.0)
Monocytes Absolute: 0.7 10*3/uL (ref 0.1–1.0)
Monocytes Relative: 6 %
Neutro Abs: 10 10*3/uL — ABNORMAL HIGH (ref 1.7–7.7)
Neutrophils Relative %: 84 %
Platelets: 520 10*3/uL — ABNORMAL HIGH (ref 150–400)
RBC: 3.92 MIL/uL (ref 3.87–5.11)
RDW: 17.4 % — ABNORMAL HIGH (ref 11.5–15.5)
WBC: 11.9 10*3/uL — ABNORMAL HIGH (ref 4.0–10.5)
nRBC: 0 % (ref 0.0–0.2)

## 2019-11-15 LAB — LACTATE DEHYDROGENASE: LDH: 648 U/L — ABNORMAL HIGH (ref 98–192)

## 2019-11-15 LAB — TSH: TSH: 1.597 u[IU]/mL (ref 0.350–4.500)

## 2019-11-15 LAB — MAGNESIUM: Magnesium: 1.9 mg/dL (ref 1.7–2.4)

## 2019-11-15 MED ORDER — SODIUM CHLORIDE 0.9 % IV SOLN
1.0000 mg/kg | Freq: Once | INTRAVENOUS | Status: AC
  Administered 2019-11-15: 35 mg via INTRAVENOUS
  Filled 2019-11-15: qty 7

## 2019-11-15 MED ORDER — DIPHENHYDRAMINE HCL 50 MG/ML IJ SOLN
25.0000 mg | Freq: Once | INTRAMUSCULAR | Status: AC
  Administered 2019-11-15: 25 mg via INTRAVENOUS
  Filled 2019-11-15: qty 1

## 2019-11-15 MED ORDER — SODIUM CHLORIDE 0.9 % IV SOLN
3.0800 mg/kg | Freq: Once | INTRAVENOUS | Status: AC
  Administered 2019-11-15: 100 mg via INTRAVENOUS
  Filled 2019-11-15: qty 10

## 2019-11-15 MED ORDER — SODIUM CHLORIDE 0.9 % IV SOLN: INTRAVENOUS | Status: DC

## 2019-11-15 MED ORDER — FAMOTIDINE IN NACL 20-0.9 MG/50ML-% IV SOLN
20.0000 mg | Freq: Once | INTRAVENOUS | Status: AC
  Administered 2019-11-15: 20 mg via INTRAVENOUS
  Filled 2019-11-15: qty 50

## 2019-11-15 MED ORDER — SODIUM CHLORIDE 0.9 % IV SOLN: Freq: Once | INTRAVENOUS | Status: AC

## 2019-11-15 MED ORDER — SODIUM CHLORIDE 0.9% IV SOLUTION: Freq: Once | INTRAVENOUS | Status: AC

## 2019-11-15 NOTE — Progress Notes (Signed)
.   Pharmacist Chemotherapy Monitoring - Initial Assessment    Anticipated start date: 11/15/19   Regimen:  . Are orders appropriate based on the patient's diagnosis, regimen, and cycle? Yes . Does the plan date match the patient's scheduled date? Yes . Is the sequencing of drugs appropriate? Yes . Are the premedications appropriate for the patient's regimen? Yes . Prior Authorization for treatment is: Approved o If applicable, is the correct biosimilar selected based on the patient's insurance? not applicable  Organ Function and Labs: Marland Kitchen Are dose adjustments needed based on the patient's renal function, hepatic function, or hematologic function? No . Are appropriate labs ordered prior to the start of patient's treatment? Yes . Other organ system assessment, if indicated: N/A . The following baseline labs, if indicated, have been ordered: nivolumab: baseline TSH +/- T4  Dose Assessment: . Are the drug doses appropriate? Yes . Are the following correct: o Drug concentrations Yes o IV fluid compatible with drug Yes o Administration routes Yes o Timing of therapy Yes . If applicable, does the patient have documented access for treatment and/or plans for port-a-cath placement? yes . If applicable, have lifetime cumulative doses been properly documented and assessed? not applicable Lifetime Dose Tracking  No doses have been documented on this patient for the following tracked chemicals: Doxorubicin, Epirubicin, Idarubicin, Daunorubicin, Mitoxantrone, Bleomycin, Oxaliplatin, Carboplatin, Liposomal Doxorubicin  o   Toxicity Monitoring/Prevention: . The patient has the following take home antiemetics prescribed: N/A . The patient has the following take home medications prescribed: N/A . Medication allergies and previous infusion related reactions, if applicable, have been reviewed and addressed. Yes . The patient's current medication list has been assessed for drug-drug interactions with  their chemotherapy regimen. no significant drug-drug interactions were identified on review.  Order Review: . Are the treatment plan orders signed? No . Is the patient scheduled to see a provider prior to their treatment? Yes  I verify that I have reviewed each item in the above checklist and answered each question accordingly.  Wynona Neat 11/15/2019 10:33 AM

## 2019-11-15 NOTE — Progress Notes (Signed)
[place], [place] [address]   CLINIC:  Medical Oncology/Hematology  PCP:  Rosita Fire, MD [place] / [place] [phone]   REASON FOR VISIT:  Follow-up for metastatic right renal cell carcinoma, liver nodules and lung nodules  PRIOR THERAPY: None  NGS Results: Not done  CURRENT THERAPY: Combination immunotherapy with Opdivo and Yervoy every 3 weeks  BRIEF ONCOLOGIC HISTORY:  Oncology History  Primary malignant neoplasm of kidney with metastasis from kidney to other site [facility])  11/12/2019 Initial Diagnosis   Primary malignant neoplasm of kidney with metastasis from kidney to other site [facility])   11/15/2019 -  Chemotherapy   The patient had ipilimumab (YERVOY) 35 mg in sodium chloride 0.9 % 50 mL chemo infusion, 1 mg/kg, Intravenous,  Once, 0 of 4 cycles nivolumab (OPDIVO) 98 mg in sodium chloride 0.9 % 50 mL chemo infusion, 3 mg/kg, Intravenous, Once, 0 of 9 cycles  for chemotherapy treatment.      CANCER STAGING: Cancer Staging No matching staging information was found for the patient.  INTERVAL HISTORY:  Alicia [name], a 79 y.o. female, returns for routine follow-up and consideration for first cycle of immunotherapy. Mallery was last seen on 11/12/2019.  Due for initiating cycle #1 of ipilimumab and nivolumab today.   Overall, she tells me she has been feeling okay. Her pain is better controlled with oxycodone. She is having regular BM's QD to BID. She has started cooking her own meals and is drinking water and Ensure daily. She has noticed that she feels better now than when she just started coming to the cancer center.  Overall, she feels ready for first cycle of immunotherapy today.    REVIEW OF SYSTEMS:  Review of Systems  Constitutional: Positive for appetite change (75%) and fatigue (25%).  Respiratory: Positive for shortness of breath (w/ exertion).   Gastrointestinal: Positive for  abdominal pain (10/10 RUQ pain).  All other systems reviewed and are negative.   PAST MEDICAL/SURGICAL HISTORY:  Past Medical History:  Diagnosis Date  . Allergic rhinitis   . Anxiety   . Cataract   . COPD (chronic obstructive pulmonary disease) ([place])   . Depression   . DeQuervain's disease (tenosynovitis)   . High cholesterol   . History of kidney cancer   . Hyperlipidemia   . Hypertension   . Irritable bowel syndrome with constipation   . Knee fracture   . Low back pain   . Migraines   . Osteoarthritis   . Peptic ulcer disease   . PVD (peripheral vascular disease) ([place])   . Right rotator cuff tear    Past Surgical History:  Procedure Laterality Date  . back tumor    . BIOPSY  08/31/2019   Procedure: BIOPSY;  Surgeon: Harvel Quale, MD;  Location: [facility];  Service: Gastroenterology;;  . breast tumor    . CATARACT EXTRACTION W/PHACO Right 03/13/2012   Procedure: CATARACT EXTRACTION PHACO AND INTRAOCULAR LENS PLACEMENT (IOC);  Surgeon: Tonny Branch, MD;  Location: [facility] ORS;  Service: Ophthalmology;  Laterality: Right;  CDE:15.51  . CATARACT EXTRACTION W/PHACO Left 03/27/2012   Procedure: CATARACT EXTRACTION PHACO AND INTRAOCULAR LENS PLACEMENT (IOC);  Surgeon: Tonny Branch, MD;  Location: [facility] ORS;  Service: Ophthalmology;  Laterality: Left;  CDE=10.91  . cataract surgery    . COLONOSCOPY  12/08/2011   Procedure: COLONOSCOPY;  Surgeon: Daneil Dolin, MD;  Location: [facility];  Service: Endoscopy;  Laterality: N/A;  11:15 AM  . COLONOSCOPY WITH PROPOFOL N/A 08/31/2019   Procedure: COLONOSCOPY WITH PROPOFOL;  Surgeon: Harvel Quale, MD;  Location: [facility];  Service: Gastroenterology;  Laterality: N/A;  815  . CYST EXCISION Right July 2015   Right Preauricular area  . ESOPHAGOGASTRODUODENOSCOPY (EGD) WITH PROPOFOL N/A 08/31/2019   Procedure: ESOPHAGOGASTRODUODENOSCOPY (EGD) WITH PROPOFOL;  Surgeon: Harvel Quale, MD;  Location: [facility];  Service: Gastroenterology;  Laterality: N/A;  . PR VEIN BYPASS GRAFT,AORTO-FEM-POP    . right ankle otif    . right femoral to above the knee popliteal bypass    . right femur fracture    . right inguinal herniorraphy      SOCIAL HISTORY:  Social History   Socioeconomic History  . Marital status: Single    Spouse name: Not on file  . Number of children: 0  . Years of education: Not on file  . Highest education level: Not on file  Occupational History  . Not on file  Tobacco Use  . Smoking status: Former Smoker    Packs/day: 2.00    Years: 52.00    Pack years: 104.00    Types: Cigarettes    Quit date: 06/23/2019    Years since quitting: 0.3  . Smokeless tobacco: Never Used  . Tobacco comment: pt states she smokes about 3-4 cigarettes a day  Vaping Use  . Vaping Use: Never assessed  Substance and Sexual Activity  . Alcohol use: No    Comment: she quit 1 year ago  . Drug use: No  . Sexual activity: Not on file  Other Topics Concern  . Not on file  Social History Narrative  . Not on file   Social Determinants of Health   Financial Resource Strain: Low Risk   . Difficulty of Paying Living Expenses: Not very hard  Food Insecurity: No Food Insecurity  . Worried About Charity fundraiser in the Last Year: Never true  . Ran Out of Food in the Last Year: Never true  Transportation Needs: No Transportation Needs  . Lack of Transportation (Medical): No  . Lack of Transportation (Non-Medical): No  Physical Activity: Inactive  . Days of Exercise per Week: 0 days  . Minutes of Exercise per Session: 0 min  Stress: Stress Concern Present  . Feeling of Stress : To some extent  Social Connections: Moderately Isolated  . Frequency of Communication with Friends and Family: Three times a week  . Frequency of Social Gatherings with Friends and Family: Three times a week  . Attends Religious Services: More than 4 times per year  . Active Member of Clubs or Organizations: No    . Attends Archivist Meetings: Never  . Marital Status: Never married  Intimate Partner Violence: Not At Risk  . Fear of Current or Ex-Partner: No  . Emotionally Abused: No  . Physically Abused: No  . Sexually Abused: No    FAMILY HISTORY:  Family History  Problem Relation Age of Onset  . Hypertension Mother   . Heart attack Mother   . Stroke Mother   . Colon cancer Neg Hx     CURRENT MEDICATIONS:  Current Outpatient Medications  Medication Sig Dispense Refill  . amLODipine (NORVASC) 10 MG tablet Take 10 mg by mouth daily.    . cilostazol (PLETAL) 100 MG tablet Take 100 mg by mouth 2 (two) times daily.     . Ensure (ENSURE) Take 237 mLs  by mouth 2 (two) times daily.    Marland Kitchen gabapentin (NEURONTIN) 100 MG capsule Take 200 mg by mouth at bedtime.     Marland Kitchen Ipilimumab (YERVOY IV) Inject into the vein every 21 ( twenty-one) days. X 4 cycles then d/c    . losartan (COZAAR) 50 MG tablet Take 50 mg by mouth daily.     . mirtazapine (REMERON) 15 MG tablet Take 15 mg by mouth at bedtime.      . Nivolumab (OPDIVO IV) Inject into the vein every 21 ( twenty-one) days. X 4 cycles, then q 28 days    . oxybutynin (DITROPAN-XL) 10 MG 24 hr tablet Take 10 mg by mouth at bedtime.    . risedronate (ACTONEL) 35 MG tablet Take 35 mg by mouth every Thursday. with water on empty stomach, nothing by mouth or lie down for next 30 minutes.    . simvastatin (ZOCOR) 20 MG tablet Take 20 mg by mouth at bedtime.      Marland Kitchen oxyCODONE (OXY IR/ROXICODONE) 5 MG immediate release tablet Take 1 tablet (5 mg total) by mouth every 6 (six) hours as needed for severe pain. (Patient not taking: Reported on 11/15/2019) 60 tablet 0   No current facility-administered medications for this visit.   Facility-Administered Medications Ordered in Other Visits  Medication Dose Route Frequency Provider Last Rate Last Admin  . 0.9 %  sodium chloride infusion   Intravenous Continuous Derek Jack, MD      . fentaNYL  (SUBLIMAZE) injection 25-50 mcg  25-50 mcg Intravenous Q5 min PRN Lerry Liner, MD        ALLERGIES:  Allergies  Allergen Reactions  . Aspirin Other (See Comments)    stomach upset    PHYSICAL EXAM:  Performance status (ECOG): 1 - Symptomatic but completely ambulatory  Vitals:   11/15/19 0850  BP: (!) 108/58  Pulse: (!) 120  Resp: 17  Temp: (!) 96.9 F (36.1 C)  SpO2: 96%   Wt Readings from Last 3 Encounters:  11/15/19 73 lb 14.4 oz (33.5 kg)  11/12/19 71 lb 13.9 oz (32.6 kg)  11/05/19 74 lb 15.3 oz (34 kg)   Physical Exam Vitals reviewed.  Constitutional:      Appearance: Normal appearance.  Cardiovascular:     Rate and Rhythm: Normal rate and regular rhythm.     Pulses: Normal pulses.     Heart sounds: Normal heart sounds.  Pulmonary:     Effort: Pulmonary effort is normal.     Breath sounds: Normal breath sounds.  Lymphadenopathy:     Upper Body:     Left upper body: Axillary adenopathy present.  Neurological:     General: No focal deficit present.     Mental Status: She is alert and oriented to person, place, and time.  Psychiatric:        Mood and Affect: Mood normal.        Behavior: Behavior normal.     LABORATORY DATA:  I have reviewed the labs as listed.  CBC Latest Ref Rng & Units 11/15/2019 11/05/2019 10/11/2019  WBC 4.0 - 10.5 K/uL 11.9(H) 14.2(H) 15.7(H)  Hemoglobin 12.0 - 15.0 g/dL 9.2(L) 9.8(L) 10.2(L)  Hematocrit 36 - 46 % 31.0(L) 33.2(L) 33.2(L)  Platelets 150 - 400 K/uL 520(H) 524(H) 536(H)   CMP Latest Ref Rng & Units 11/15/2019 10/11/2019 09/20/2019  Glucose 70 - 99 mg/dL 124(H) 119(H) 102(H)  BUN 8 - 23 mg/dL 26(H) 16 30(H)  Creatinine 0.44 - 1.00 mg/dL 1.28(H) 0.82  0.87  Sodium 135 - 145 mmol/L 137 135 135  Potassium 3.5 - 5.1 mmol/L 4.6 4.1 4.2  Chloride 98 - 111 mmol/L 98 99 99  CO2 22 - 32 mmol/L 26 25 23   Calcium 8.9 - 10.3 mg/dL 9.1 9.1 9.3  Total Protein 6.5 - 8.1 g/dL 7.3 7.2 7.9  Total Bilirubin 0.3 - 1.2 mg/dL 0.6 0.5  0.4  Alkaline Phos 38 - 126 U/L 129(H) 95 79  AST 15 - 41 U/L 17 11(L) 12(L)  ALT 0 - 44 U/L 10 8 8    Lab Results  Component Value Date   LDH 648 (H) 11/15/2019   LDH 314 (H) 09/20/2019    DIAGNOSTIC IMAGING:  I have independently reviewed the scans and discussed with the patient. MR Brain W Wo Contrast  Result Date: 10/17/2019 CLINICAL DATA:  79 year old female with renal mass with abdominal lymphadenopathy, and suspicion of pulmonary and bony metastatic disease on recent CTA chest. Staging. EXAM: MRI HEAD WITHOUT AND WITH CONTRAST TECHNIQUE: Multiplanar, multiecho pulse sequences of the brain and surrounding structures were obtained without and with intravenous contrast. CONTRAST:  66mL GADAVIST GADOBUTROL 1 MMOL/ML IV SOLN COMPARISON:  PET-CT 10/01/2019. FINDINGS: Brain: Cerebral volume is within normal limits for age. No restricted diffusion to suggest acute infarction. No midline shift, mass effect, evidence of mass lesion, ventriculomegaly, extra-axial collection or acute intracranial hemorrhage. Cervicomedullary junction and pituitary are within normal limits. No abnormal enhancement identified.  No dural thickening. Largely normal for age gray and white matter signal throughout the brain; mild nonspecific scattered white matter T2 and FLAIR hyperintensity, mostly in the anterior frontal lobes. Possible chronic microhemorrhage in the right parietal lobe on series 13, image 35. No other chronic cerebral blood products. Vascular: Major intracranial vascular flow voids are preserved. Skull and upper cervical spine: There is a roughly 3 cm area of suspicious sclerosis of the right parietal bone (series 10, image 15 and series 18, image 7) with, but this demonstrates only subtle increased signal on DWI (series 7, image 7) and little postcontrast enhancement. Elsewhere in the skull bone marrow signal is within normal limits. Heterogeneous marrow signal in the cervical spine at C3 could be degenerative  in nature. However, there is suspicious marrow replacement of the left C4 posterior elements on series 9, image 12. Negative visible cervical spinal cord. Sinuses/Orbits: Postoperative changes to both globes, otherwise negative orbits. Mild paranasal sinus periosteal thickening suspected such as from previous inflammation. Other: Trace bilateral mastoid fluid. Grossly normal visible internal auditory structures. Scalp and face soft tissues appear negative. IMPRESSION: 1. No metastatic disease to the brain or acute intracranial abnormality identified. 2. 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 Electronically Signed   By: Genevie Ann M.D.   On: 10/17/2019 12:21   [place] CORE BIOPSY (LIVER)  Result Date: 11/05/2019 INDICATION: 79 year old female with a history of FDG avid right liver lesion, un known metastasis EXAM: IMAGE GUIDED BIOPSY OF RIGHT LIVER MASS MEDICATIONS: None. ANESTHESIA/SEDATION: Moderate (conscious) sedation was employed during this procedure. A total of Versed 1.0 mg and Fentanyl 50 mcg was administered intravenously. Moderate Sedation Time: 13 minutes. The patient's level of consciousness and vital signs were monitored continuously by radiology nursing throughout the procedure under my direct supervision. FLUOROSCOPY TIME:  Ultrasound COMPLICATIONS: None PROCEDURE: Informed written consent was obtained from the patient after a thorough discussion of the procedural risks, benefits and alternatives. All questions were addressed. Maximal Sterile Barrier Technique was utilized including caps, mask, sterile  gowns, sterile gloves, sterile drape, hand hygiene and skin antiseptic. A timeout was performed prior to the initiation of the procedure. Ultrasound survey of the right liver lobe performed with images stored and sent to PACs. The right lower thorax/right upper abdomen was prepped with chlorhexidine in a sterile fashion, and a sterile drape was applied covering the operative  field. A sterile gown and sterile gloves were used for the procedure. Local anesthesia was provided with 1% Lidocaine. The patient was prepped and draped sterilely and the skin and subcutaneous tissues were generously infiltrated with 1% lidocaine. A 17 gauge introducer needle was then advanced under ultrasound guidance in an intercostal location into the right liver lobe, targeting hypoechoic mass in the subcapsular location. The stylet was removed, and multiple separate 18 gauge core biopsy were retrieved. Samples were placed into formalin for transportation to the lab. The needle was removed, and a final ultrasound image was performed. The patient tolerated the procedure well and remained hemodynamically stable throughout. No complications were encountered and no significant blood loss was encounter. IMPRESSION: Status post ultrasound-guided biopsy of right hepatic hypoechoic mass corresponding to the findings of prior PET-CT. Signed, Dulcy Fanny. Dellia Nims, RPVI [facility] Electronically Signed   By: Corrie Mckusick D.O.   On: 11/05/2019 15:20     ASSESSMENT:  1.  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 -MRI of the brain on 10/17/2019 with no metastatic disease. -LDH was elevated at 314. SPEP was negative. -PET scan on 10/01/2019 shows intense FDG uptake in the inferior pole of the right kidney, bulky retroperitoneal adenopathy, left cervical lymph node, left axilla and left retropectoral region, innumerable bone lesions, solitary FDG avid right hepatic lobe liver metastasis, small pulmonary nodules. -IMDC poor prognostic  risk group, 4 points -Combination ipilimumab and nivolumab started on 11/07/2019.  2. Weight loss: -30 to 40 pound weight loss since January of this year. Initially had vomiting which subsided at this time. She does report occasional night sweats. -Colonoscopy on 08/31/2019 shows diverticulosis in the sigmoid colon, descending colon and and ascending colon. 2 nonbleeding colonic angiodysplastic lesions. One 64millimeter polyp in the sigmoid colon. -EGD on 08/31/2019 shows 3 cm hiatal hernia, normal stomach, duodenum.  3. Social/family history: -Quit smoking 2 and half months ago. She lives alone with her dog. -Smoked 1 to 2 packs/day for 60 years. -No family history of malignancies.   PLAN:  1. Metastatic renal cell carcinoma: -She is overall feeling better today. -I reviewed her labs.  Creatinine increased to 1.8.  We'll give her some fluids.  Encouraged her to drink 2 to 3 L of water daily. -LDH is elevated at 648.  White count is also elevated at 11.9 with elevated neutrophil count.  Platelet count is high at 520. -We talked about starting her on combination immunotherapy with the ipilimumab and nivolumab every 3 weeks for 4 cycles followed by nivolumab maintenance.  I have quoted response rates of about 40%.  I think this will be a better tolerable regimen for her rather than combination TKI and immunotherapy. -We talked about immunotherapy related side effects again. -She will proceed with her first cycle today. -She will be reevaluated next week for toxicity  assessment and possible hydration.  I will see her back in 3 weeks for follow-up.  2. Weight loss: -Her weight has been stable since last visit.  3.  Diffuse body pains: -Oxycodone 5 mg helped. -She'll continue oxycodone 5 mg every 6 hours as needed. -Continue stool softener for constipation.  4.  Microcytic anemia: -This is anemia from chronic inflammation from malignancy.  Consider checking ferritin, iron  panel.  5.  Bone metastasis: -We will discuss about initiating her on denosumab at the time of cycle 2.   Orders placed this encounter:  No orders of the defined types were placed in this encounter.    Derek Jack, MD [place] [phone]   I, Milinda Antis, am acting as a scribe for Dr. Sanda Linger.  I, Derek Jack MD, have reviewed the above documentation for accuracy and completeness, and I agree with the above.

## 2019-11-15 NOTE — Progress Notes (Signed)
Alicia [name] presents today for D1C1 Opdivo/Yervoy. Pt denies any new changes or symptoms since last visit. Lab results and vitals have been reviewed and are stable and within parameters for treatment. Patient has been assessed by Dr. Delton Coombes who has approved proceeding with treatment today as planned.  Infusions tolerated without incident or complaint. VSS upon completion of treatment. IV flushed and removed per protocol. IV noted for positive blood return prior to, during, and post infusions by 2 RNs, see MAR and IV flowsheet for details. Discharged in satisfactory condition with follow up instructions.

## 2019-11-15 NOTE — Patient Instructions (Signed)
[place] at [facility] Discharge Instructions  You were seen today by Dr. Delton Coombes. He went over your recent results. You received your first treatment today. If you develop watery stools for than 5 times per day, dry cough, or worsening shortness of breath, call the office immediately. Do not take the oxycodone if you are too drowsy and take only once daily; purchase stool softener over the counter and take daily to prevent constipation. Your next appointment will be in 1 week with the nurse practitioner for labs and follow up. Dr. Delton Coombes will see you back in 3 weeks for labs and follow up.   Thank you for choosing [place] at [facility] to provide your oncology and hematology care.  To afford each patient quality time with our provider, please arrive at least 15 minutes before your scheduled appointment time.   If you have a lab appointment with the [facility] please come in thru the Main Entrance and check in at the main information desk  You need to re-schedule your appointment should you arrive 10 or more minutes late.  We strive to give you quality time with our providers, and arriving late affects you and other patients whose appointments are after yours.  Also, if you no show three or more times for appointments you may be dismissed from the clinic at the providers discretion.     Again, thank you for choosing [facility].  Our hope is that these requests will decrease the amount of time that you wait before being seen by our physicians.       _____________________________________________________________  Should you have questions after your visit to [facility], please contact our office at [phone] between the hours of 8:00 a.m. and 4:30 p.m.  Voicemails left after 4:00 p.m. will not be returned until the following business day.  For prescription refill requests, have your pharmacy contact our office  and allow 72 hours.    Cancer Center Support Programs:   > Cancer Support Group  2nd Tuesday of the month 1pm-2pm, Journey Room

## 2019-11-15 NOTE — Progress Notes (Signed)
Patient was assessed by Dr. Delton Coombes and labs have been reviewed.  Creatinine 1.28 orders received for normal saline for 500 ml over 1 hour.  Patient is okay to proceed with treatment today. Primary RN and pharmacy aware.

## 2019-11-15 NOTE — Patient Instructions (Signed)
[facility] Discharge Instructions for Patients Receiving Chemotherapy   Beginning January 23rd 2017 lab work for the [facility] will be done in the  Main lab at [facility] on 1st floor. If you have a lab appointment with the [facility] please come in thru the  Main Entrance and check in at the main information desk   Today you received the following chemotherapy agents Opdivo and Yervoy  To help prevent nausea and vomiting after your treatment, we encourage you to take your nausea medication   If you develop nausea and vomiting, or diarrhea that is not controlled by your medication, call the clinic.  The clinic phone number is [phone]. Office hours are Monday-Friday 8:30am-5:00pm.  BELOW ARE SYMPTOMS THAT SHOULD BE REPORTED IMMEDIATELY:  *FEVER GREATER THAN 101.0 F  *CHILLS WITH OR WITHOUT FEVER  NAUSEA AND VOMITING THAT IS NOT CONTROLLED WITH YOUR NAUSEA MEDICATION  *UNUSUAL SHORTNESS OF BREATH  *UNUSUAL BRUISING OR BLEEDING  TENDERNESS IN MOUTH AND THROAT WITH OR WITHOUT PRESENCE OF ULCERS  *URINARY PROBLEMS  *BOWEL PROBLEMS  UNUSUAL RASH Items with * indicate a potential emergency and should be followed up as soon as possible. If you have an emergency after office hours please contact your primary care physician or go to the nearest emergency department.  Please call the clinic during office hours if you have any questions or concerns.   You may also contact the Patient Navigator at [phone] should you have any questions or need assistance in obtaining follow up care.      Resources For Cancer Patients and their Caregivers ? American Cancer Society: Can assist with transportation, wigs, general needs, runs Look Good Feel Better.        8430559797 ? Cancer Care: Provides financial assistance, online support groups, medication/co-pay assistance.  1-800-813-HOPE 534-416-6739) ? [place] [facility] [place] cancer patients and their families through emotional , educational and financial support.  (262) 497-5066 ? [place] DSS Where to apply for food stamps, Medicaid and utility assistance. 4326998240 ? [facility]: Transportation to medical appointments. (972)211-3087 ? Social Security Administration: May apply for disability if have a Stage IV cancer. 709-160-7119 437 277 2879 ? [employer], Disability and Transit Services: Assists with nutrition, care and transit needs. (248)527-1167

## 2019-11-15 NOTE — Progress Notes (Signed)
Immunotherapy education packet given and discussed with pt and family in detail. Discussed diagnosis and staging, tx regimen, and intent of tx. Reviewed immunotherapy medications and side effects, as well as pre-medications.  Instructed on how to manage side effects at home, and when to call the clinic. Importance of fever/chills discussed with pt and family. Discussed precautions to implement at home after receiving tx, as well as self care strategies. Phone numbers provided for clinic during regular working hours, also how to reach the clinic after hours and on weekends. Pt and family provided the opportunity to ask questions - all questions answered to pt's and family satisfaction.

## 2019-11-19 DIAGNOSIS — C787 Secondary malignant neoplasm of liver and intrahepatic bile duct: Secondary | ICD-10-CM | POA: Diagnosis not present

## 2019-11-19 DIAGNOSIS — C7951 Secondary malignant neoplasm of bone: Secondary | ICD-10-CM | POA: Diagnosis not present

## 2019-11-19 DIAGNOSIS — C641 Malignant neoplasm of right kidney, except renal pelvis: Secondary | ICD-10-CM | POA: Diagnosis not present

## 2019-11-19 DIAGNOSIS — Z515 Encounter for palliative care: Secondary | ICD-10-CM | POA: Diagnosis not present

## 2019-11-19 DIAGNOSIS — C7800 Secondary malignant neoplasm of unspecified lung: Secondary | ICD-10-CM | POA: Diagnosis not present

## 2019-11-19 DIAGNOSIS — E46 Unspecified protein-calorie malnutrition: Secondary | ICD-10-CM | POA: Diagnosis not present

## 2019-11-20 ENCOUNTER — Ambulatory Visit (HOSPITAL_COMMUNITY): Payer: Medicare HMO | Admitting: General Practice

## 2019-11-20 ENCOUNTER — Encounter (HOSPITAL_COMMUNITY): Payer: Self-pay | Admitting: General Practice

## 2019-11-20 DIAGNOSIS — C641 Malignant neoplasm of right kidney, except renal pelvis: Secondary | ICD-10-CM | POA: Diagnosis not present

## 2019-11-20 DIAGNOSIS — C787 Secondary malignant neoplasm of liver and intrahepatic bile duct: Secondary | ICD-10-CM | POA: Diagnosis not present

## 2019-11-20 DIAGNOSIS — C7800 Secondary malignant neoplasm of unspecified lung: Secondary | ICD-10-CM | POA: Diagnosis not present

## 2019-11-20 DIAGNOSIS — C7951 Secondary malignant neoplasm of bone: Secondary | ICD-10-CM | POA: Diagnosis not present

## 2019-11-20 DIAGNOSIS — C779 Secondary and unspecified malignant neoplasm of lymph node, unspecified: Secondary | ICD-10-CM | POA: Diagnosis not present

## 2019-11-20 DIAGNOSIS — E46 Unspecified protein-calorie malnutrition: Secondary | ICD-10-CM | POA: Diagnosis not present

## 2019-11-20 DIAGNOSIS — Z515 Encounter for palliative care: Secondary | ICD-10-CM | POA: Diagnosis not present

## 2019-11-20 NOTE — Progress Notes (Signed)
[facility] CSW Progress Note  Call to patient as scheduled for social work assessment, no answer, no VM, rescheduled visit for next Tuesday.  Edwyna Shell, LCSW Clinical Social Worker Phone:  [phone]

## 2019-11-20 NOTE — Progress Notes (Signed)
[facility] Clinical Social Work  Initial Assessment   Alicia [name] is a 79 y.o. year old female {assessed by phone - unable to reach patient, spoke w Ramond Craver - a friend who is one of several who are attempting to help her as she has no family to do so.  Clinical Social Work was referred by treatment team for assessment of psychosocial needs.   SDOH (Social Determinants of Health) assessments performed: Yes   Distress Screen completed: No   Family/Social Information:  . Housing Arrangement: patient lives alone - has two supportive friends.  Rents house in neighborhood.  Having difficulty keeping up with housekeeping duties.   . Family members/support persons in your life? Does not have any family at all.  Has friend who used to be her former employer "shes been part of the family since that time."  Has not worked for this company for 15 years but comes to holiday dinners and similar.  Friends are trying to get her to eat and deliver meals.  Palliative care is setting up Meals on Wheels.  Will also try to get help w bathing and straightening up house.  Sleeps on couch.   . Transportation concerns: Friend has taken her for most of the appointments - Clair Gulling and Jan both provide transport.  Friends are trying to "piece together something to make sure we check on her and that she gets there."  Friend would prefer later morning appointments so friends are better able to assist w transport.  Would prefer appointments after 10:30 AM. . Employment: Retired. .  Income source: retirement income . Financial concerns: Yes, current concerns o Type of concern: Transportation and Food, general support at home . Food access concerns: having trouble accessing food - will be linked w Meals on Wheels by Palliative Care nurse; has Food Stamps so can purchase snacks and similar . Religious or spiritual practice: has friends who are doing Bible studies with . Medication Concerns:friends help her pick up medications, no  issues w paying for meds . Services Currently in place:  Palliative care  Coping/ Adjustment to diagnosis: . Patient understands treatment plan and what happens next? Per friend, patient is newly diagnosed with kidney cancer - this was upsetting to her and she wants all treatment possible at this time.  "She wants to live."  Significant psychosocial challenges and lack of family support.  Determined to remain independent and live on her own but health challenges are making this difficult.  She has little energy "it is difficult for her to do much more than move from chair to chair."  Has declined wheelchair.   . Concerns about diagnosis and/or treatment: psychosocial issues . 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 Hopes and priorities: "she just wants to live". Wished kidney could be surgically removed.  Has a dog that she loves.  May be having difficulty providing food and water to the animal.   . Patient enjoys watching TV, time with family/ friends and sleeps, does word search puzzles, reads newspapers . Current coping skills/ strengths: Supportive family/friends    SUMMARY: Current SDOH Barriers:  . Limited social support . Transportation . Limited access to food . Lives alone  Interventions: . Discussed common feeling and emotions when being diagnosed with cancer, and the importance of support during treatment . Informed patient of the support team roles and support services at [facility] . Provided CSW contact information and encouraged patient to call with any questions or concerns . Patient interviewed  and appropriate assessments performed - spoke w friend who has been working w others to support patient Talk w [place] NP Enis Gash to determine how they are assisting her.  Per friend, Mary Sella, NP is referring to Meals on Wheels and will also request shower aide and housekeeping help.  NP has referred to [facility]" to help w in home aide.  Per NP, patient is very  independent and wants to remain in her home, does not accept help easily.    Follow Up Plan: SW will follow up with patient by phone over the next two weeks Patient verbalizes understanding of plan: Yes - friend will communicate w patient re [place] , [place], [place] Worker Phone:  [phone]

## 2019-11-21 ENCOUNTER — Ambulatory Visit (HOSPITAL_COMMUNITY): Payer: Medicare HMO | Admitting: Hematology

## 2019-11-21 ENCOUNTER — Inpatient Hospital Stay (HOSPITAL_COMMUNITY): Payer: Medicare HMO

## 2019-11-21 ENCOUNTER — Ambulatory Visit (HOSPITAL_COMMUNITY): Payer: Medicare HMO

## 2019-11-21 ENCOUNTER — Other Ambulatory Visit (HOSPITAL_COMMUNITY): Payer: Medicare HMO

## 2019-11-22 ENCOUNTER — Other Ambulatory Visit (HOSPITAL_COMMUNITY): Payer: Self-pay

## 2019-11-22 DIAGNOSIS — C649 Malignant neoplasm of unspecified kidney, except renal pelvis: Secondary | ICD-10-CM

## 2019-11-23 ENCOUNTER — Other Ambulatory Visit: Payer: Self-pay

## 2019-11-23 ENCOUNTER — Inpatient Hospital Stay (HOSPITAL_COMMUNITY): Payer: Medicare HMO | Attending: Hematology

## 2019-11-23 ENCOUNTER — Ambulatory Visit (HOSPITAL_COMMUNITY): Payer: Medicare HMO

## 2019-11-23 ENCOUNTER — Inpatient Hospital Stay (HOSPITAL_BASED_OUTPATIENT_CLINIC_OR_DEPARTMENT_OTHER): Payer: Medicare HMO | Admitting: Oncology

## 2019-11-23 VITALS — BP 133/81 | HR 114 | Temp 97.6°F | Resp 18 | Wt 79.6 lb

## 2019-11-23 DIAGNOSIS — M1990 Unspecified osteoarthritis, unspecified site: Secondary | ICD-10-CM | POA: Diagnosis not present

## 2019-11-23 DIAGNOSIS — E785 Hyperlipidemia, unspecified: Secondary | ICD-10-CM | POA: Diagnosis not present

## 2019-11-23 DIAGNOSIS — Z5112 Encounter for antineoplastic immunotherapy: Secondary | ICD-10-CM | POA: Insufficient documentation

## 2019-11-23 DIAGNOSIS — F418 Other specified anxiety disorders: Secondary | ICD-10-CM | POA: Insufficient documentation

## 2019-11-23 DIAGNOSIS — Z23 Encounter for immunization: Secondary | ICD-10-CM | POA: Insufficient documentation

## 2019-11-23 DIAGNOSIS — R634 Abnormal weight loss: Secondary | ICD-10-CM | POA: Diagnosis not present

## 2019-11-23 DIAGNOSIS — E7800 Pure hypercholesterolemia, unspecified: Secondary | ICD-10-CM | POA: Insufficient documentation

## 2019-11-23 DIAGNOSIS — K635 Polyp of colon: Secondary | ICD-10-CM | POA: Insufficient documentation

## 2019-11-23 DIAGNOSIS — K769 Liver disease, unspecified: Secondary | ICD-10-CM | POA: Diagnosis not present

## 2019-11-23 DIAGNOSIS — C641 Malignant neoplasm of right kidney, except renal pelvis: Secondary | ICD-10-CM | POA: Diagnosis not present

## 2019-11-23 DIAGNOSIS — C649 Malignant neoplasm of unspecified kidney, except renal pelvis: Secondary | ICD-10-CM | POA: Diagnosis not present

## 2019-11-23 DIAGNOSIS — D509 Iron deficiency anemia, unspecified: Secondary | ICD-10-CM | POA: Insufficient documentation

## 2019-11-23 DIAGNOSIS — I10 Essential (primary) hypertension: Secondary | ICD-10-CM | POA: Diagnosis not present

## 2019-11-23 DIAGNOSIS — C7951 Secondary malignant neoplasm of bone: Secondary | ICD-10-CM | POA: Insufficient documentation

## 2019-11-23 DIAGNOSIS — I739 Peripheral vascular disease, unspecified: Secondary | ICD-10-CM | POA: Diagnosis not present

## 2019-11-23 DIAGNOSIS — J449 Chronic obstructive pulmonary disease, unspecified: Secondary | ICD-10-CM | POA: Insufficient documentation

## 2019-11-23 DIAGNOSIS — R200 Anesthesia of skin: Secondary | ICD-10-CM | POA: Insufficient documentation

## 2019-11-23 DIAGNOSIS — K449 Diaphragmatic hernia without obstruction or gangrene: Secondary | ICD-10-CM | POA: Diagnosis not present

## 2019-11-23 DIAGNOSIS — Z87891 Personal history of nicotine dependence: Secondary | ICD-10-CM | POA: Insufficient documentation

## 2019-11-23 DIAGNOSIS — R918 Other nonspecific abnormal finding of lung field: Secondary | ICD-10-CM | POA: Insufficient documentation

## 2019-11-23 LAB — IRON AND TIBC
Iron: 14 ug/dL — ABNORMAL LOW (ref 28–170)
Saturation Ratios: 9 % — ABNORMAL LOW (ref 10.4–31.8)
TIBC: 158 ug/dL — ABNORMAL LOW (ref 250–450)
UIBC: 144 ug/dL

## 2019-11-23 LAB — COMPREHENSIVE METABOLIC PANEL
ALT: 13 U/L (ref 0–44)
AST: 14 U/L — ABNORMAL LOW (ref 15–41)
Albumin: 3 g/dL — ABNORMAL LOW (ref 3.5–5.0)
Alkaline Phosphatase: 159 U/L — ABNORMAL HIGH (ref 38–126)
Anion gap: 13 (ref 5–15)
BUN: 14 mg/dL (ref 8–23)
CO2: 25 mmol/L (ref 22–32)
Calcium: 9 mg/dL (ref 8.9–10.3)
Chloride: 99 mmol/L (ref 98–111)
Creatinine, Ser: 0.64 mg/dL (ref 0.44–1.00)
GFR, Estimated: >60 mL/min (ref >60)
Glucose, Bld: 110 mg/dL — ABNORMAL HIGH (ref 70–99)
Potassium: 4.2 mmol/L (ref 3.5–5.1)
Sodium: 137 mmol/L (ref 135–145)
Total Bilirubin: 0.4 mg/dL (ref 0.3–1.2)
Total Protein: 7.6 g/dL (ref 6.5–8.1)

## 2019-11-23 LAB — CBC WITH DIFFERENTIAL/PLATELET
Abs Immature Granulocytes: 0.05 10*3/uL (ref 0.00–0.07)
Basophils Absolute: 0.1 10*3/uL (ref 0.0–0.1)
Basophils Relative: 1 %
Eosinophils Absolute: 0.1 10*3/uL (ref 0.0–0.5)
Eosinophils Relative: 1 %
HCT: 33.3 % — ABNORMAL LOW (ref 36.0–46.0)
Hemoglobin: 9.9 g/dL — ABNORMAL LOW (ref 12.0–15.0)
Immature Granulocytes: 1 %
Lymphocytes Relative: 14 %
Lymphs Abs: 1.3 10*3/uL (ref 0.7–4.0)
MCH: 23.3 pg — ABNORMAL LOW (ref 26.0–34.0)
MCHC: 29.7 g/dL — ABNORMAL LOW (ref 30.0–36.0)
MCV: 78.5 fL — ABNORMAL LOW (ref 80.0–100.0)
Monocytes Absolute: 0.4 10*3/uL (ref 0.1–1.0)
Monocytes Relative: 4 %
Neutro Abs: 6.9 10*3/uL (ref 1.7–7.7)
Neutrophils Relative %: 79 %
Platelets: 491 10*3/uL — ABNORMAL HIGH (ref 150–400)
RBC: 4.24 MIL/uL (ref 3.87–5.11)
RDW: 18 % — ABNORMAL HIGH (ref 11.5–15.5)
WBC: 8.7 10*3/uL (ref 4.0–10.5)
nRBC: 0 % (ref 0.0–0.2)

## 2019-11-23 LAB — FERRITIN: Ferritin: 367 ng/mL — ABNORMAL HIGH (ref 11–307)

## 2019-11-23 LAB — TSH: TSH: 0.542 u[IU]/mL (ref 0.350–4.500)

## 2019-11-23 LAB — MAGNESIUM: Magnesium: 1.5 mg/dL — ABNORMAL LOW (ref 1.7–2.4)

## 2019-11-23 LAB — LACTATE DEHYDROGENASE: LDH: 424 U/L — ABNORMAL HIGH (ref 98–192)

## 2019-11-23 MED ORDER — MAGNESIUM OXIDE 400 (241.3 MG) MG PO TABS: 400.0000 mg | ORAL_TABLET | Freq: Every day | ORAL | 0 refills | Status: DC

## 2019-11-23 MED ORDER — INFLUENZA VAC A&B SA ADJ QUAD 0.5 ML IM PRSY
0.5000 mL | PREFILLED_SYRINGE | Freq: Once | INTRAMUSCULAR | Status: AC
  Administered 2019-11-23: 0.5 mL via INTRAMUSCULAR
  Filled 2019-11-23: qty 0.5

## 2019-11-23 NOTE — Progress Notes (Signed)
[place], [place] [address]   CLINIC:  Medical Oncology/Hematology  PCP:  Rosita Fire, MD [place] / [place] [address] [phone]   REASON FOR VISIT:  Follow-up for metastatic right renal cell carcinoma, liver nodules and lung nodules  PRIOR THERAPY: None  NGS Results: Not done  CURRENT THERAPY: Combination immunotherapy with Opdivo and Yervoy every 3 weeks  BRIEF ONCOLOGIC HISTORY:  Oncology History  Primary malignant neoplasm of kidney with metastasis from kidney to other site [facility])  11/12/2019 Initial Diagnosis   Primary malignant neoplasm of kidney with metastasis from kidney to other site [facility])   11/15/2019 -  Chemotherapy   The patient had ipilimumab (YERVOY) 35 mg in sodium chloride 0.9 % 50 mL chemo infusion, 1 mg/kg = 35 mg, Intravenous,  Once, 1 of 4 cycles nivolumab (OPDIVO) 100 mg in sodium chloride 0.9 % 50 mL chemo infusion, 3.08 mg/kg = 98 mg, Intravenous, Once, 1 of 9 cycles Administration: 100 mg (11/15/2019)  for chemotherapy treatment.      CANCER STAGING: Cancer Staging No matching staging information was found for the patient.  INTERVAL HISTORY:  Ms. DEBI COUSIN, a 79 y.o. female, returns to assess tolerance of first treatment.  She was last seen on 11/12/2019.  Received cycle 1 ipilimumab and nivolumab on 11/12/2019.  Overall, she tells me she is doing "great".  She feels the best she has felt in months.  Her appetite has picked up quite a bit.  She has gained 6 pounds since last visit.  Her energy levels are 60%.  She has rotating constipation and diarrhea but mainly constipation.  Abdominal pain is minimal.  She is using her pain medication.  She states she feels so good she could go dancing.  She is drinking supplements but can only afford 1/day.  REVIEW OF SYSTEMS:  Review of Systems  Constitutional: Negative for appetite change, fatigue, fever and unexpected weight change.    HENT:   Negative for nosebleeds, sore throat and trouble swallowing.   Eyes: Negative.   Respiratory: Negative.  Negative for cough, shortness of breath and wheezing.   Cardiovascular: Negative.  Negative for chest pain and leg swelling.  Gastrointestinal: Positive for abdominal pain, constipation and diarrhea. Negative for blood in stool, nausea and vomiting.  Endocrine: Negative.   Genitourinary: Negative.  Negative for bladder incontinence, hematuria and nocturia.   Musculoskeletal: Negative.  Negative for back pain and flank pain.  Skin: Negative.   Neurological: Negative.  Negative for dizziness, headaches, light-headedness and numbness.  Hematological: Negative.   Psychiatric/Behavioral: Negative.  Negative for confusion. The patient is not nervous/anxious.     PAST MEDICAL/SURGICAL HISTORY:  Past Medical History:  Diagnosis Date  . Allergic rhinitis   . Anxiety   . Cataract   . COPD (chronic obstructive pulmonary disease) ([place])   . Depression   . DeQuervain's disease (tenosynovitis)   . High cholesterol   . History of kidney cancer   . Hyperlipidemia   . Hypertension   . Irritable bowel syndrome with constipation   . Knee fracture   . Low back pain   . Migraines   . Osteoarthritis   . Peptic ulcer disease   . PVD (peripheral vascular disease) ([place])   . Right rotator cuff tear    Past Surgical History:  Procedure Laterality Date  . back tumor    . BIOPSY  08/31/2019   Procedure: BIOPSY;  Surgeon: Harvel Quale,  MD;  Location: [facility];  Service: Gastroenterology;;  . breast tumor    . CATARACT EXTRACTION W/PHACO Right 03/13/2012   Procedure: CATARACT EXTRACTION PHACO AND INTRAOCULAR LENS PLACEMENT (IOC);  Surgeon: Tonny Branch, MD;  Location: [facility] ORS;  Service: Ophthalmology;  Laterality: Right;  CDE:15.51  . CATARACT EXTRACTION W/PHACO Left 03/27/2012   Procedure: CATARACT EXTRACTION PHACO AND INTRAOCULAR LENS PLACEMENT (IOC);  Surgeon: Tonny Branch, MD;   Location: [facility] ORS;  Service: Ophthalmology;  Laterality: Left;  CDE=10.91  . cataract surgery    . COLONOSCOPY  12/08/2011   Procedure: COLONOSCOPY;  Surgeon: Daneil Dolin, MD;  Location: [facility];  Service: Endoscopy;  Laterality: N/A;  11:15 AM  . COLONOSCOPY WITH PROPOFOL N/A 08/31/2019   Procedure: COLONOSCOPY WITH PROPOFOL;  Surgeon: Harvel Quale, MD;  Location: [facility];  Service: Gastroenterology;  Laterality: N/A;  815  . CYST EXCISION Right July 2015   Right Preauricular area  . ESOPHAGOGASTRODUODENOSCOPY (EGD) WITH PROPOFOL N/A 08/31/2019   Procedure: ESOPHAGOGASTRODUODENOSCOPY (EGD) WITH PROPOFOL;  Surgeon: Harvel Quale, MD;  Location: [facility];  Service: Gastroenterology;  Laterality: N/A;  . PR VEIN BYPASS GRAFT,AORTO-FEM-POP    . right ankle otif    . right femoral to above the knee popliteal bypass    . right femur fracture    . right inguinal herniorraphy      SOCIAL HISTORY:  Social History   Socioeconomic History  . Marital status: Single    Spouse name: Not on file  . Number of children: 0  . Years of education: Not on file  . Highest education level: Not on file  Occupational History  . Not on file  Tobacco Use  . Smoking status: Former Smoker    Packs/day: 2.00    Years: 52.00    Pack years: 104.00    Types: Cigarettes    Quit date: 06/23/2019    Years since quitting: 0.4  . Smokeless tobacco: Never Used  . Tobacco comment: pt states she smokes about 3-4 cigarettes a day  Vaping Use  . Vaping Use: Never assessed  Substance and Sexual Activity  . Alcohol use: No    Comment: she quit 1 year ago  . Drug use: No  . Sexual activity: Not on file  Other Topics Concern  . Not on file  Social History Narrative  . Not on file   Social Determinants of Health   Financial Resource Strain: Low Risk   . Difficulty of Paying Living Expenses: Not very hard  Food Insecurity: No Food Insecurity  . Worried About Paediatric nurse in the Last Year: Never true  . Ran Out of Food in the Last Year: Never true  Transportation Needs: No Transportation Needs  . Lack of Transportation (Medical): No  . Lack of Transportation (Non-Medical): No  Physical Activity: Inactive  . Days of Exercise per Week: 0 days  . Minutes of Exercise per Session: 0 min  Stress: Stress Concern Present  . Feeling of Stress : To some extent  Social Connections: Moderately Isolated  . Frequency of Communication with Friends and Family: Three times a week  . Frequency of Social Gatherings with Friends and Family: Three times a week  . Attends Religious Services: More than 4 times per year  . Active Member of Clubs or Organizations: No  . Attends Archivist Meetings: Never  . Marital Status: Never married  Intimate Partner Violence: Not At Risk  .  Fear of Current or Ex-Partner: No  . Emotionally Abused: No  . Physically Abused: No  . Sexually Abused: No    FAMILY HISTORY:  Family History  Problem Relation Age of Onset  . Hypertension Mother   . Heart attack Mother   . Stroke Mother   . Colon cancer Neg Hx     CURRENT MEDICATIONS:  Current Outpatient Medications  Medication Sig Dispense Refill  . amLODipine (NORVASC) 10 MG tablet Take 10 mg by mouth daily.    . cilostazol (PLETAL) 100 MG tablet Take 100 mg by mouth 2 (two) times daily.     . Ensure (ENSURE) Take 237 mLs by mouth 2 (two) times daily.    Marland Kitchen gabapentin (NEURONTIN) 100 MG capsule Take 200 mg by mouth at bedtime.     Marland Kitchen Ipilimumab (YERVOY IV) Inject into the vein every 21 ( twenty-one) days. X 4 cycles then d/c    . losartan (COZAAR) 50 MG tablet Take 50 mg by mouth daily.     . mirtazapine (REMERON) 15 MG tablet Take 15 mg by mouth at bedtime.      . Nivolumab (OPDIVO IV) Inject into the vein every 21 ( twenty-one) days. X 4 cycles, then q 28 days    . oxybutynin (DITROPAN-XL) 10 MG 24 hr tablet Take 10 mg by mouth at bedtime.    Marland Kitchen oxyCODONE (OXY  IR/ROXICODONE) 5 MG immediate release tablet Take 1 tablet (5 mg total) by mouth every 6 (six) hours as needed for severe pain. 60 tablet 0  . risedronate (ACTONEL) 35 MG tablet Take 35 mg by mouth every Thursday. with water on empty stomach, nothing by mouth or lie down for next 30 minutes.    . simvastatin (ZOCOR) 20 MG tablet Take 20 mg by mouth at bedtime.      . magnesium oxide (MAG-OX) 400 (241.3 Mg) MG tablet Take 1 tablet (400 mg total) by mouth daily. 10 tablet 0   No current facility-administered medications for this visit.   Facility-Administered Medications Ordered in Other Visits  Medication Dose Route Frequency Provider Last Rate Last Admin  . fentaNYL (SUBLIMAZE) injection 25-50 mcg  25-50 mcg Intravenous Q5 min PRN Lerry Liner, MD        ALLERGIES:  Allergies  Allergen Reactions  . Aspirin Other (See Comments)    stomach upset    PHYSICAL EXAM:  Performance status (ECOG): 1 - Symptomatic but completely ambulatory  Vitals:   11/23/19 0852  BP: 133/81  Pulse: (!) 114  Resp: 18  Temp: 97.6 F (36.4 C)  SpO2: 96%   Wt Readings from Last 3 Encounters:  11/23/19 79 lb 9.6 oz (36.1 kg)  11/15/19 73 lb 14.4 oz (33.5 kg)  11/12/19 71 lb 13.9 oz (32.6 kg)   Physical Exam Vitals reviewed.  Constitutional:      Appearance: Normal appearance.  Cardiovascular:     Rate and Rhythm: Normal rate and regular rhythm.     Pulses: Normal pulses.     Heart sounds: Normal heart sounds.  Pulmonary:     Effort: Pulmonary effort is normal.     Breath sounds: Normal breath sounds.  Lymphadenopathy:     Upper Body:     Left upper body: Axillary adenopathy present.  Neurological:     General: No focal deficit present.     Mental Status: She is alert and oriented to person, place, and time.  Psychiatric:        Mood and Affect: Mood  normal.        Behavior: Behavior normal.     LABORATORY DATA:  I have reviewed the labs as listed.  CBC Latest Ref Rng & Units  11/23/2019 11/15/2019 11/05/2019  WBC 4.0 - 10.5 K/uL 8.7 11.9(H) 14.2(H)  Hemoglobin 12.0 - 15.0 g/dL 9.9(L) 9.2(L) 9.8(L)  Hematocrit 36 - 46 % 33.3(L) 31.0(L) 33.2(L)  Platelets 150 - 400 K/uL 491(H) 520(H) 524(H)   CMP Latest Ref Rng & Units 11/23/2019 11/15/2019 10/11/2019  Glucose 70 - 99 mg/dL 110(H) 124(H) 119(H)  BUN 8 - 23 mg/dL 14 26(H) 16  Creatinine 0.44 - 1.00 mg/dL 0.64 1.28(H) 0.82  Sodium 135 - 145 mmol/L 137 137 135  Potassium 3.5 - 5.1 mmol/L 4.2 4.6 4.1  Chloride 98 - 111 mmol/L 99 98 99  CO2 22 - 32 mmol/L 25 26 25   Calcium 8.9 - 10.3 mg/dL 9.0 9.1 9.1  Total Protein 6.5 - 8.1 g/dL 7.6 7.3 7.2  Total Bilirubin 0.3 - 1.2 mg/dL 0.4 0.6 0.5  Alkaline Phos 38 - 126 U/L 159(H) 129(H) 95  AST 15 - 41 U/L 14(L) 17 11(L)  ALT 0 - 44 U/L 13 10 8    Lab Results  Component Value Date   LDH 424 (H) 11/23/2019   LDH 648 (H) 11/15/2019   LDH 314 (H) 09/20/2019    DIAGNOSTIC IMAGING:  I have independently reviewed the scans and discussed with the patient. [place] CORE BIOPSY (LIVER)  Result Date: 11/05/2019 INDICATION: 79 year old female with a history of FDG avid right liver lesion, un known metastasis EXAM: IMAGE GUIDED BIOPSY OF RIGHT LIVER MASS MEDICATIONS: None. ANESTHESIA/SEDATION: Moderate (conscious) sedation was employed during this procedure. A total of Versed 1.0 mg and Fentanyl 50 mcg was administered intravenously. Moderate Sedation Time: 13 minutes. The patient's level of consciousness and vital signs were monitored continuously by radiology nursing throughout the procedure under my direct supervision. FLUOROSCOPY TIME:  Ultrasound COMPLICATIONS: None PROCEDURE: Informed written consent was obtained from the patient after a thorough discussion of the procedural risks, benefits and alternatives. All questions were addressed. Maximal Sterile Barrier Technique was utilized including caps, mask, sterile gowns, sterile gloves, sterile drape, hand hygiene and skin antiseptic. A  timeout was performed prior to the initiation of the procedure. Ultrasound survey of the right liver lobe performed with images stored and sent to PACs. The right lower thorax/right upper abdomen was prepped with chlorhexidine in a sterile fashion, and a sterile drape was applied covering the operative field. A sterile gown and sterile gloves were used for the procedure. Local anesthesia was provided with 1% Lidocaine. The patient was prepped and draped sterilely and the skin and subcutaneous tissues were generously infiltrated with 1% lidocaine. A 17 gauge introducer needle was then advanced under ultrasound guidance in an intercostal location into the right liver lobe, targeting hypoechoic mass in the subcapsular location. The stylet was removed, and multiple separate 18 gauge core biopsy were retrieved. Samples were placed into formalin for transportation to the lab. The needle was removed, and a final ultrasound image was performed. The patient tolerated the procedure well and remained hemodynamically stable throughout. No complications were encountered and no significant blood loss was encounter. IMPRESSION: Status post ultrasound-guided biopsy of right hepatic hypoechoic mass corresponding to the findings of prior PET-CT. Signed, Dulcy Fanny. Dellia Nims, RPVI [facility] Radiology Electronically Signed   By: Corrie Mckusick D.O.   On: 11/05/2019 15:20     ASSESSMENT:  1.  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 -MRI of the brain on 10/17/2019 with no metastatic disease. -LDH was elevated at 314. SPEP was negative. -PET scan on  10/01/2019 shows intense FDG uptake in the inferior pole of the right kidney, bulky retroperitoneal adenopathy, left cervical lymph node, left axilla and left retropectoral region, innumerable bone lesions, solitary FDG avid right hepatic lobe liver metastasis, small pulmonary nodules. -IMDC poor prognostic risk group, 4 points -Combination ipilimumab and nivolumab started on 11/07/2019.  2. Weight loss: -30 to 40 pound weight loss since January of this year. Initially had vomiting which subsided at this time. She does report occasional night sweats. -Colonoscopy on 08/31/2019 shows diverticulosis in the sigmoid colon, descending colon and and ascending colon. 2 nonbleeding colonic angiodysplastic lesions. One 75millimeter polyp in the sigmoid colon. -EGD on 08/31/2019 shows 3 cm hiatal hernia, normal stomach, duodenum.  3. Social/family history: -Quit smoking 2 and half months ago. She lives alone with her dog. -Smoked 1 to 2 packs/day for 60 years. -No family history of malignancies.   PLAN:  1. Metastatic renal cell carcinoma: -She is overall feeling better today. -I reviewed her labs.  Her creatinine has returned to normal since last week.  She is drinking fluids and eating food.  Her albumin has improved and is 3.0.  Her magnesium is slightly low at 1.5. -LDH is elevated at 429 but improved from last week.  White count has normalized.  Platelet count is high at 491 but trending down. -She is scheduled to return to clinic on 12/06/2019 for lab work and assessment prior to cycle 2.  2. Weight loss: -Her weight has increased by 5 pounds since last week. -She is meeting with our dietitian today. -Patient will get samples today to help supplement her supply at home  3.  Diffuse body pains: -Oxycodone 5 mg helped. -She'll continue oxycodone 5 mg every 6 hours as needed. -Continue stool softener for constipation.  4.  Microcytic anemia: -This is anemia from chronic  inflammation from malignancy.  Consider checking ferritin, iron panel.  5.  Bone metastasis: -We will discuss about initiating her on denosumab at the time of cycle 2.  6.  Hypomagnesemia: -Magnesium 1.5. -She is tolerating food and drink well. -Unable to add on IV magnesium today. -Can trial a weeks worth of oral magnesium to see if this improves her levels. -We discussed side effects from oral magnesium including diarrhea.  If this happens she is to call clinic. -Rx magnesium supplements sent to pharmacy.   Disposition: Start magnesium supplements RTC in 1 week for repeat labs (CMP, Mag).  RTC in 2 weeks for lab work, assessment and consideration of cycle 2   Orders placed this encounter:  No orders of the defined types were placed in this encounter.  Faythe Casa, NP 11/23/2019 10:14 AM  [place] [phone]  Greater than 50% was spent in counseling and coordination of care with this patient including but not limited to discussion of the relevant topics above (See A&P) including, but not limited to diagnosis and management of acute and chronic medical conditions.

## 2019-11-23 NOTE — Progress Notes (Signed)
Alicia [name] presents today for injection per the provider's orders.  High dose flu administration without incident; injection site WNL; see MAR for injection details.  Patient tolerated procedure well and without incident.  No questions or complaints noted at this time.

## 2019-11-23 NOTE — Progress Notes (Signed)
Nutrition Assessment   Reason for Assessment:  Referral from Sonia Baller, NP and Social Work   ASSESSMENT:  79 year old female with metastatic right renal cell carcinoma, liver nodules and lung nodules. Past medical history of COPD, HLD, HTN, PUD, PVD.  Patient receiving immunotherapy.   Met with patient in clinic following NP visit.  Patient reports that she has a good appetite.  Reports that she can't eat anything too rich and go lay down.  Does not like eggs or peanut butter.  Drinks ensure shakes but does not when she goes out due to diarrhea.  Does not have any back teeth so eats soft foods.  Eats things such as trite and spam sandwiches. Friends bring her things to eat as well.  Reports that she will begin receiving Meals on Wheels on 11/9. Drinks whole milk.      Medications: reviewed   Labs: reviewed   Anthropometrics:   Height: 61 inches Weight: 79 lb 9.6 oz today 89 lb 06/15/2019 BMI: 15  11% weight loss in the last 5 months    NUTRITION DIAGNOSIS: Unintentional weight loss related to cancer as evidenced by 11% weight loss and BMI 15   INTERVENTION:  Provided complimentary case of ensure plus.   Encouraged intake of high calorie, high protein foods Provided food list of high magnesium foods.   Contact information provided   MONITORING, EVALUATION, GOAL: weight trends, intake   Next Visit: Dec 3 phone f/u  Morning Halberg B. Zenia Resides, [place], [place] Registered Dietitian [phone] (mobile)

## 2019-11-27 ENCOUNTER — Inpatient Hospital Stay (HOSPITAL_COMMUNITY): Payer: Medicare HMO | Admitting: General Practice

## 2019-11-27 DIAGNOSIS — C649 Malignant neoplasm of unspecified kidney, except renal pelvis: Secondary | ICD-10-CM

## 2019-11-27 NOTE — Progress Notes (Signed)
[facility] CSW Progress Notes  Phone call to patient to continue to follow up on needs.  She states she has difficulty hearing the phone and sometimes does not know she has a call.  She does appreciate the contact and states that she is often lonely.  Likes to have people she can sit and talk with.  Worries that because she lives alone, no one will know that she is ill or has fallen.  On the other hand, she values her independence and has lived in her current home for approx 20 years.  She does not want to move despite her physical difficulties and isolation.  She has neighbors who check on her and come when she calls.  She has friends who bring her food and transport to the doctor. A neighbor can help feeding and watering her dog.  The dog is a valued companion.  She has a concern w her heat - in the past, she received a load of wood each winter from [place] and heated w wood.  She is now physically unable to load her stove or carry wood.  Her landlord is investigating the option of installing electric heat in her house, she will know if this is feasible tomorrow.  She would like to get a LifeAlert system - something that would alert emergency responders if she falls.  She relates an incident where she fell in the back yard and broke both ankles.  She has just started receiving Meals on Wheels today.  She feels she does not eat much and is trying to make herself eat.  She reports stomach pain which is making it diffiuclt to eat.  She is open to some kind of aide in the home - wants to bathe herself, but acknowledges that both the company/companionship and the practice help would be beneficial.  Enis Gash, NP from [place] was working on that referral.  Patent is aware that if her heat situation is not solved, she may need to consider moving into an apartment or similar facility.  However, she wants to maintain her independence and live at home as long as possible.  Edwyna Shell, LCSW Clinical Social Worker Phone:  [phone]

## 2019-11-29 ENCOUNTER — Inpatient Hospital Stay (HOSPITAL_COMMUNITY): Payer: Medicare HMO

## 2019-12-06 ENCOUNTER — Other Ambulatory Visit (HOSPITAL_COMMUNITY): Payer: Self-pay

## 2019-12-06 ENCOUNTER — Inpatient Hospital Stay (HOSPITAL_COMMUNITY): Payer: Medicare HMO

## 2019-12-06 ENCOUNTER — Other Ambulatory Visit: Payer: Self-pay

## 2019-12-06 ENCOUNTER — Inpatient Hospital Stay (HOSPITAL_BASED_OUTPATIENT_CLINIC_OR_DEPARTMENT_OTHER): Payer: Medicare HMO | Admitting: Hematology

## 2019-12-06 ENCOUNTER — Ambulatory Visit: Payer: Medicare HMO | Admitting: Urology

## 2019-12-06 VITALS — BP 120/81 | HR 118 | Temp 97.2°F | Resp 18

## 2019-12-06 VITALS — BP 144/74 | HR 104 | Temp 97.0°F | Resp 18

## 2019-12-06 DIAGNOSIS — E876 Hypokalemia: Secondary | ICD-10-CM

## 2019-12-06 DIAGNOSIS — C649 Malignant neoplasm of unspecified kidney, except renal pelvis: Secondary | ICD-10-CM

## 2019-12-06 DIAGNOSIS — K635 Polyp of colon: Secondary | ICD-10-CM | POA: Diagnosis not present

## 2019-12-06 DIAGNOSIS — R200 Anesthesia of skin: Secondary | ICD-10-CM | POA: Diagnosis not present

## 2019-12-06 DIAGNOSIS — R634 Abnormal weight loss: Secondary | ICD-10-CM | POA: Diagnosis not present

## 2019-12-06 DIAGNOSIS — R918 Other nonspecific abnormal finding of lung field: Secondary | ICD-10-CM | POA: Diagnosis not present

## 2019-12-06 DIAGNOSIS — R079 Chest pain, unspecified: Secondary | ICD-10-CM

## 2019-12-06 DIAGNOSIS — K449 Diaphragmatic hernia without obstruction or gangrene: Secondary | ICD-10-CM | POA: Diagnosis not present

## 2019-12-06 DIAGNOSIS — C7951 Secondary malignant neoplasm of bone: Secondary | ICD-10-CM | POA: Diagnosis not present

## 2019-12-06 DIAGNOSIS — K769 Liver disease, unspecified: Secondary | ICD-10-CM | POA: Diagnosis not present

## 2019-12-06 DIAGNOSIS — Z23 Encounter for immunization: Secondary | ICD-10-CM | POA: Diagnosis not present

## 2019-12-06 DIAGNOSIS — C641 Malignant neoplasm of right kidney, except renal pelvis: Secondary | ICD-10-CM

## 2019-12-06 DIAGNOSIS — N2889 Other specified disorders of kidney and ureter: Secondary | ICD-10-CM

## 2019-12-06 DIAGNOSIS — Z5112 Encounter for antineoplastic immunotherapy: Secondary | ICD-10-CM | POA: Diagnosis not present

## 2019-12-06 LAB — COMPREHENSIVE METABOLIC PANEL
ALT: 11 U/L (ref 0–44)
AST: 13 U/L — ABNORMAL LOW (ref 15–41)
Albumin: 2.7 g/dL — ABNORMAL LOW (ref 3.5–5.0)
Alkaline Phosphatase: 158 U/L — ABNORMAL HIGH (ref 38–126)
Anion gap: 14 (ref 5–15)
BUN: 15 mg/dL (ref 8–23)
CO2: 26 mmol/L (ref 22–32)
Calcium: 8.9 mg/dL (ref 8.9–10.3)
Chloride: 95 mmol/L — ABNORMAL LOW (ref 98–111)
Creatinine, Ser: 0.68 mg/dL (ref 0.44–1.00)
GFR, Estimated: >60 mL/min (ref >60)
Glucose, Bld: 168 mg/dL — ABNORMAL HIGH (ref 70–99)
Potassium: 3.4 mmol/L — ABNORMAL LOW (ref 3.5–5.1)
Sodium: 135 mmol/L (ref 135–145)
Total Bilirubin: 0.6 mg/dL (ref 0.3–1.2)
Total Protein: 7.2 g/dL (ref 6.5–8.1)

## 2019-12-06 LAB — TSH: TSH: 1.519 u[IU]/mL (ref 0.350–4.500)

## 2019-12-06 LAB — CBC WITH DIFFERENTIAL/PLATELET
Abs Immature Granulocytes: 0.05 10*3/uL (ref 0.00–0.07)
Basophils Absolute: 0 10*3/uL (ref 0.0–0.1)
Basophils Relative: 0 %
Eosinophils Absolute: 0 10*3/uL (ref 0.0–0.5)
Eosinophils Relative: 0 %
HCT: 35.3 % — ABNORMAL LOW (ref 36.0–46.0)
Hemoglobin: 10.3 g/dL — ABNORMAL LOW (ref 12.0–15.0)
Immature Granulocytes: 1 %
Lymphocytes Relative: 11 %
Lymphs Abs: 1.1 10*3/uL (ref 0.7–4.0)
MCH: 22.4 pg — ABNORMAL LOW (ref 26.0–34.0)
MCHC: 29.2 g/dL — ABNORMAL LOW (ref 30.0–36.0)
MCV: 76.9 fL — ABNORMAL LOW (ref 80.0–100.0)
Monocytes Absolute: 0.4 10*3/uL (ref 0.1–1.0)
Monocytes Relative: 4 %
Neutro Abs: 8.8 10*3/uL — ABNORMAL HIGH (ref 1.7–7.7)
Neutrophils Relative %: 84 %
Platelets: 498 10*3/uL — ABNORMAL HIGH (ref 150–400)
RBC: 4.59 MIL/uL (ref 3.87–5.11)
RDW: 18.3 % — ABNORMAL HIGH (ref 11.5–15.5)
WBC: 10.3 10*3/uL (ref 4.0–10.5)
nRBC: 0 % (ref 0.0–0.2)

## 2019-12-06 LAB — MAGNESIUM: Magnesium: 1.6 mg/dL — ABNORMAL LOW (ref 1.7–2.4)

## 2019-12-06 LAB — LACTATE DEHYDROGENASE: LDH: 667 U/L — ABNORMAL HIGH (ref 98–192)

## 2019-12-06 MED ORDER — OXYCODONE HCL 5 MG PO TABS
5.0000 mg | ORAL_TABLET | Freq: Four times a day (QID) | ORAL | 0 refills | Status: DC | PRN
Start: 2019-12-06 — End: 2020-02-04

## 2019-12-06 MED ORDER — SODIUM CHLORIDE 0.9 % IV SOLN
1.0000 mg/kg | Freq: Once | INTRAVENOUS | Status: AC
  Administered 2019-12-06: 35 mg via INTRAVENOUS
  Filled 2019-12-06: qty 7

## 2019-12-06 MED ORDER — SODIUM CHLORIDE 0.9 % IV SOLN
3.0800 mg/kg | Freq: Once | INTRAVENOUS | Status: AC
  Administered 2019-12-06: 100 mg via INTRAVENOUS
  Filled 2019-12-06: qty 10

## 2019-12-06 MED ORDER — MORPHINE SULFATE (PF) 2 MG/ML IV SOLN
INTRAVENOUS | Status: AC
  Filled 2019-12-06: qty 1

## 2019-12-06 MED ORDER — POTASSIUM CHLORIDE CRYS ER 20 MEQ PO TBCR
20.0000 meq | EXTENDED_RELEASE_TABLET | Freq: Once | ORAL | Status: AC
  Administered 2019-12-06: 20 meq via ORAL
  Filled 2019-12-06: qty 1

## 2019-12-06 MED ORDER — MAGNESIUM SULFATE IN D5W 1-5 GM/100ML-% IV SOLN
1.0000 g | Freq: Once | INTRAVENOUS | Status: AC
  Administered 2019-12-06: 1 g via INTRAVENOUS
  Filled 2019-12-06: qty 100

## 2019-12-06 MED ORDER — DIPHENHYDRAMINE HCL 50 MG/ML IJ SOLN
25.0000 mg | Freq: Once | INTRAMUSCULAR | Status: AC
  Administered 2019-12-06: 25 mg via INTRAVENOUS
  Filled 2019-12-06: qty 1

## 2019-12-06 MED ORDER — MAGNESIUM SULFATE 50 % IJ SOLN: 1.0000 g | Freq: Once | INTRAVENOUS | Status: DC

## 2019-12-06 MED ORDER — SODIUM CHLORIDE 0.9 % IV SOLN: INTRAVENOUS | Status: DC

## 2019-12-06 MED ORDER — FAMOTIDINE IN NACL 20-0.9 MG/50ML-% IV SOLN
20.0000 mg | Freq: Once | INTRAVENOUS | Status: AC
  Administered 2019-12-06: 20 mg via INTRAVENOUS
  Filled 2019-12-06: qty 50

## 2019-12-06 MED ORDER — MORPHINE SULFATE (PF) 2 MG/ML IV SOLN
2.0000 mg | Freq: Once | INTRAVENOUS | Status: AC
  Administered 2019-12-06: 2 mg via INTRAVENOUS

## 2019-12-06 NOTE — Progress Notes (Signed)
Alicia [name] presents today for [employer]. Pt denies any new changes or symptoms since last treatment. Her only complaint is mid chest pain that is squeezing in nature that she rates greater than a 10. She states that she woke up with it at [facility] today and took her oxycodone which did not relieve it. She was doubled over in the wheelchair upon arrival. Vitals are stable, labs pending. Dr. Delton Coombes made aware and ordered a STAT EKG as well as 2mg  IV Morphine to be given once. IV started and Morphine given while obtaining EKG. EKG shows Sinus Tach, HR 112 and no abnormalities. After Morphine pt states her pain was relieved and is now a 6/10. Dr. Delton Coombes updated.  Lab results and vitals have been reviewed and are stable and within parameters for treatment. Patient has been assessed by Dr. Delton Coombes who has approved proceeding with treatment today as planned.  Infusions tolerated without incident or complaint. VSS upon completion of treatment. Pt reports that her pain is completely resolved. IV flushed and removed per protocol, see MAR and IV flowsheet for details. Discharged in satisfactory condition with follow up instructions.

## 2019-12-06 NOTE — Patient Instructions (Signed)
[facility] Discharge Instructions for Patients Receiving Chemotherapy   Beginning January 23rd 2017 lab work for the [facility] will be done in the  Main lab at [facility] on 1st floor. If you have a lab appointment with the [facility] please come in thru the  Main Entrance and check in at the main information desk   Today you received the following chemotherapy agents Opdivo and Yervoy  To help prevent nausea and vomiting after your treatment, we encourage you to take your nausea medication    If you develop nausea and vomiting, or diarrhea that is not controlled by your medication, call the clinic.  The clinic phone number is [phone]. Office hours are Monday-Friday 8:30am-5:00pm.  BELOW ARE SYMPTOMS THAT SHOULD BE REPORTED IMMEDIATELY:  *FEVER GREATER THAN 101.0 F  *CHILLS WITH OR WITHOUT FEVER  NAUSEA AND VOMITING THAT IS NOT CONTROLLED WITH YOUR NAUSEA MEDICATION  *UNUSUAL SHORTNESS OF BREATH  *UNUSUAL BRUISING OR BLEEDING  TENDERNESS IN MOUTH AND THROAT WITH OR WITHOUT PRESENCE OF ULCERS  *URINARY PROBLEMS  *BOWEL PROBLEMS  UNUSUAL RASH Items with * indicate a potential emergency and should be followed up as soon as possible. If you have an emergency after office hours please contact your primary care physician or go to the nearest emergency department.  Please call the clinic during office hours if you have any questions or concerns.   You may also contact the Patient Navigator at [phone] should you have any questions or need assistance in obtaining follow up care.      Resources For Cancer Patients and their Caregivers ? American Cancer Society: Can assist with transportation, wigs, general needs, runs Look Good Feel Better.        ([phone] ? Cancer Care: Provides financial assistance, online support groups, medication/co-pay assistance.  1-800-813-HOPE 289-009-2944) ? [place] Assists [place] cancer patients and their families through emotional , educational and financial support.  916-671-9754 ? [place] DSS Where to apply for food stamps, Medicaid and utility assistance. 907-520-6810 ? [facility]: Transportation to medical appointments. 343 538 6286 ? Social Security Administration: May apply for disability if have a Stage IV cancer. 6106730215 541 815 7822 ? [employer], Disability and Transit Services: Assists with nutrition, care and transit needs. 684-399-0513

## 2019-12-06 NOTE — Patient Instructions (Addendum)
[place] at [facility] Discharge Instructions  You were seen today by Dr. Delton Coombes. He went over your recent results. You received your treatment today. You can take 2 tablets of oxycodone 5 mg every 6 hours if your pain worsens. Eat your usual meals before drinking Boost. Dr. Delton Coombes will see you back in 3 weeks for labs and follow up.   Thank you for choosing [place] at [facility] to provide your oncology and hematology care.  To afford each patient quality time with our provider, please arrive at least 15 minutes before your scheduled appointment time.   If you have a lab appointment with the [facility] please come in thru the Main Entrance and check in at the main information desk  You need to re-schedule your appointment should you arrive 10 or more minutes late.  We strive to give you quality time with our providers, and arriving late affects you and other patients whose appointments are after yours.  Also, if you no show three or more times for appointments you may be dismissed from the clinic at the providers discretion.     Again, thank you for choosing [facility].  Our hope is that these requests will decrease the amount of time that you wait before being seen by our physicians.       _____________________________________________________________  Should you have questions after your visit to [facility], please contact our office at [phone] between the hours of 8:00 a.m. and 4:30 p.m.  Voicemails left after 4:00 p.m. will not be returned until the following business day.  For prescription refill requests, have your pharmacy contact our office and allow 72 hours.    Cancer Center Support Programs:   > Cancer Support Group  2nd Tuesday of the month 1pm-2pm, Journey Room

## 2019-12-06 NOTE — Progress Notes (Signed)
[place], [place] [address]   CLINIC:  Medical Oncology/Hematology  PCP:  Rosita Fire, MD [place] / [place] [address] [phone]   REASON FOR VISIT:  Follow-up for metastatic right renal cell carcinoma, liver nodules and lung nodules  PRIOR THERAPY: None  NGS Results: Not done  CURRENT THERAPY: Combination immunotherapy with Opdivo and Yervoy every 3 weeks  BRIEF ONCOLOGIC HISTORY:  Oncology History  Primary malignant neoplasm of kidney with metastasis from kidney to other site [facility])  11/12/2019 Initial Diagnosis   Primary malignant neoplasm of kidney with metastasis from kidney to other site [facility])   11/15/2019 -  Chemotherapy   The patient had ipilimumab (YERVOY) 35 mg in sodium chloride 0.9 % 50 mL chemo infusion, 1 mg/kg = 35 mg, Intravenous,  Once, 2 of 4 cycles Administration: 35 mg (11/15/2019) nivolumab (OPDIVO) 100 mg in sodium chloride 0.9 % 50 mL chemo infusion, 3.08 mg/kg = 98 mg, Intravenous, Once, 2 of 9 cycles Administration: 100 mg (11/15/2019)  for chemotherapy treatment.      CANCER STAGING: Cancer Staging No matching staging information was found for the patient.  INTERVAL HISTORY:  [name], a 79 y.o. female, returns for routine follow-up and consideration for next cycle of immunotherapy. Jaleya was last seen on 11/15/2019.  Due for cycle #2 of ipilimumab and nivolumab today.   Overall, she tells me she has been feeling poorly. She reports that she developed pain in her entire abdomen and back this morning and her pain improved once she received morphine in the cancer center. She takes oxycodone 5 mg every 6 hours. She denies having any cough. Her appetite is decreased to 25% and she orders meals through [employer] Whe[employer]. She tolerated her previous treatment well. She complains of having numbness in the anterior portion of her mouth.  Overall, she feels ready for next cycle of  immunotherapy today.    REVIEW OF SYSTEMS:  Review of Systems  Constitutional: Positive for appetite change (25%) and fatigue (depleted).  Respiratory: Positive for shortness of breath. Negative for cough.   Cardiovascular: Positive for chest pain (7/10 CP, side and back pain) and palpitations.  Neurological: Positive for dizziness (occasional) and numbness (mouth).  Psychiatric/Behavioral: Positive for depression and sleep disturbance.  All other systems reviewed and are negative.   PAST MEDICAL/SURGICAL HISTORY:  Past Medical History:  Diagnosis Date  . Allergic rhinitis   . Anxiety   . Cataract   . COPD (chronic obstructive pulmonary disease) ([place])   . Depression   . DeQuervain's disease (tenosynovitis)   . High cholesterol   . History of kidney cancer   . Hyperlipidemia   . Hypertension   . Irritable bowel syndrome with constipation   . Knee fracture   . Low back pain   . Migraines   . Osteoarthritis   . Peptic ulcer disease   . PVD (peripheral vascular disease) ([place])   . Right rotator cuff tear    Past Surgical History:  Procedure Laterality Date  . back tumor    . BIOPSY  08/31/2019   Procedure: BIOPSY;  Surgeon: Harvel Quale, MD;  Location: [facility];  Service: Gastroenterology;;  . breast tumor    . CATARACT EXTRACTION W/PHACO Right 03/13/2012   Procedure: CATARACT EXTRACTION PHACO AND INTRAOCULAR LENS PLACEMENT (IOC);  Surgeon: Tonny Branch, MD;  Location: [facility] ORS;  Service: Ophthalmology;  Laterality: Right;  CDE:15.51  . CATARACT EXTRACTION  W/PHACO Left 03/27/2012   Procedure: CATARACT EXTRACTION PHACO AND INTRAOCULAR LENS PLACEMENT (IOC);  Surgeon: Tonny Branch, MD;  Location: [facility] ORS;  Service: Ophthalmology;  Laterality: Left;  CDE=10.91  . cataract surgery    . COLONOSCOPY  12/08/2011   Procedure: COLONOSCOPY;  Surgeon: Daneil Dolin, MD;  Location: [facility];  Service: Endoscopy;  Laterality: N/A;  11:15 AM  . COLONOSCOPY WITH PROPOFOL N/A  08/31/2019   Procedure: COLONOSCOPY WITH PROPOFOL;  Surgeon: Harvel Quale, MD;  Location: [facility];  Service: Gastroenterology;  Laterality: N/A;  815  . CYST EXCISION Right July 2015   Right Preauricular area  . ESOPHAGOGASTRODUODENOSCOPY (EGD) WITH PROPOFOL N/A 08/31/2019   Procedure: ESOPHAGOGASTRODUODENOSCOPY (EGD) WITH PROPOFOL;  Surgeon: Harvel Quale, MD;  Location: [facility];  Service: Gastroenterology;  Laterality: N/A;  . PR VEIN BYPASS GRAFT,AORTO-FEM-POP    . right ankle otif    . right femoral to above the knee popliteal bypass    . right femur fracture    . right inguinal herniorraphy      SOCIAL HISTORY:  Social History   Socioeconomic History  . Marital status: Single    Spouse name: Not on file  . Number of children: 0  . Years of education: Not on file  . Highest education level: Not on file  Occupational History  . Not on file  Tobacco Use  . Smoking status: Former Smoker    Packs/day: 2.00    Years: 52.00    Pack years: 104.00    Types: Cigarettes    Quit date: 06/23/2019    Years since quitting: 0.4  . Smokeless tobacco: Never Used  . Tobacco comment: pt states she smokes about 3-4 cigarettes a day  Vaping Use  . Vaping Use: Never assessed  Substance and Sexual Activity  . Alcohol use: No    Comment: she quit 1 year ago  . Drug use: No  . Sexual activity: Not on file  Other Topics Concern  . Not on file  Social History Narrative  . Not on file   Social Determinants of Health   Financial Resource Strain: Low Risk   . Difficulty of Paying Living Expenses: Not very hard  Food Insecurity: No Food Insecurity  . Worried About Charity fundraiser in the Last Year: Never true  . Ran Out of Food in the Last Year: Never true  Transportation Needs: No Transportation Needs  . Lack of Transportation (Medical): No  . Lack of Transportation (Non-Medical): No  Physical Activity: Inactive  . Days of Exercise per Week: 0 days   . Minutes of Exercise per Session: 0 min  Stress: Stress Concern Present  . Feeling of Stress : To some extent  Social Connections: Moderately Isolated  . Frequency of Communication with Friends and Family: Three times a week  . Frequency of Social Gatherings with Friends and Family: Three times a week  . Attends Religious Services: More than 4 times per year  . Active Member of Clubs or Organizations: No  . Attends Archivist Meetings: Never  . Marital Status: Never married  Intimate Partner Violence: Not At Risk  . Fear of Current or Ex-Partner: No  . Emotionally Abused: No  . Physically Abused: No  . Sexually Abused: No    FAMILY HISTORY:  Family History  Problem Relation Age of Onset  . Hypertension Mother   . Heart attack Mother   . Stroke Mother   .  Colon cancer Neg Hx     CURRENT MEDICATIONS:  Current Outpatient Medications  Medication Sig Dispense Refill  . albuterol (VENTOLIN HFA) 108 (90 Base) MCG/ACT inhaler Inhale into the lungs.    Marland Kitchen amLODipine (NORVASC) 10 MG tablet Take 10 mg by mouth daily.    . cilostazol (PLETAL) 100 MG tablet Take 100 mg by mouth 2 (two) times daily.     . Ensure (ENSURE) Take 237 mLs by mouth 2 (two) times daily.    Marland Kitchen gabapentin (NEURONTIN) 100 MG capsule Take 200 mg by mouth at bedtime.     Marland Kitchen Ipilimumab (YERVOY IV) Inject into the vein every 21 ( twenty-one) days. X 4 cycles then d/c    . losartan (COZAAR) 50 MG tablet Take 50 mg by mouth daily.     . magnesium oxide (MAG-OX) 400 (241.3 Mg) MG tablet Take 1 tablet (400 mg total) by mouth daily. 10 tablet 0  . mirtazapine (REMERON) 15 MG tablet Take 15 mg by mouth at bedtime.      . Nivolumab (OPDIVO IV) Inject into the vein every 21 ( twenty-one) days. X 4 cycles, then q 28 days    . oxybutynin (DITROPAN-XL) 10 MG 24 hr tablet Take 10 mg by mouth at bedtime.    Marland Kitchen oxyCODONE (OXY IR/ROXICODONE) 5 MG immediate release tablet Take 1 tablet (5 mg total) by mouth every 6 (six) hours  as needed for severe pain. 60 tablet 0  . risedronate (ACTONEL) 35 MG tablet Take 35 mg by mouth every Thursday. with water on empty stomach, nothing by mouth or lie down for next 30 minutes.    . simvastatin (ZOCOR) 20 MG tablet Take 20 mg by mouth at bedtime.       No current facility-administered medications for this visit.   Facility-Administered Medications Ordered in Other Visits  Medication Dose Route Frequency Provider Last Rate Last Admin  . 0.9 %  sodium chloride infusion   Intravenous Continuous Derek Jack, MD 50 mL/hr at 12/06/19 0847 New Bag at 12/06/19 0847  . diphenhydrAMINE (BENADRYL) injection 25 mg  25 mg Intravenous Once Derek Jack, MD      . famotidine (PEPCID) IVPB 20 mg premix  20 mg Intravenous Once Derek Jack, MD      . fentaNYL (SUBLIMAZE) injection 25-50 mcg  25-50 mcg Intravenous Q5 min PRN Lerry Liner, MD      . ipilimumab (YERVOY) 35 mg in sodium chloride 0.9 % 50 mL chemo infusion  1 mg/kg (Treatment Plan Recorded) Intravenous Once Derek Jack, MD      . magnesium sulfate 1 g in dextrose 5 % 50 mL IVPB  1 g Intravenous Once Derek Jack, MD      . nivolumab (OPDIVO) 98 mg in sodium chloride 0.9 % 50 mL chemo infusion  3 mg/kg (Treatment Plan Recorded) Intravenous Once Derek Jack, MD      . potassium chloride SA (KLOR-CON) CR tablet 20 mEq  20 mEq Oral Once Derek Jack, MD        ALLERGIES:  Allergies  Allergen Reactions  . Aspirin Other (See Comments)    stomach upset    PHYSICAL EXAM:  Performance status (ECOG): 1 - Symptomatic but completely ambulatory  Vitals:   12/06/19 0857  BP: 120/81  Pulse: (!) 118  Resp: 18  Temp: (!) 97.2 F (36.2 C)  SpO2: 93%   Wt Readings from Last 3 Encounters:  11/23/19 79 lb 9.6 oz (36.1 kg)  11/15/19 73 lb 14.4  oz (33.5 kg)  11/12/19 71 lb 13.9 oz (32.6 kg)   Physical Exam Vitals reviewed.  Constitutional:      Appearance: Normal appearance.   HENT:     Mouth/Throat:     Tongue: No lesions.     Pharynx: No pharyngeal swelling.     Tonsils: No tonsillar exudate.  Cardiovascular:     Rate and Rhythm: Normal rate and regular rhythm.     Pulses: Normal pulses.     Heart sounds: Normal heart sounds.  Pulmonary:     Effort: Pulmonary effort is normal.     Breath sounds: Normal breath sounds.  Musculoskeletal:     Right lower leg: No edema.     Left lower leg: No edema.  Neurological:     General: No focal deficit present.     Mental Status: She is alert and oriented to person, place, and time.  Psychiatric:        Mood and Affect: Mood normal.        Behavior: Behavior normal.     LABORATORY DATA:  I have reviewed the labs as listed.  CBC Latest Ref Rng & Units 12/06/2019 11/23/2019 11/15/2019  WBC 4.0 - 10.5 K/uL 10.3 8.7 11.9(H)  Hemoglobin 12.0 - 15.0 g/dL 10.3(L) 9.9(L) 9.2(L)  Hematocrit 36 - 46 % 35.3(L) 33.3(L) 31.0(L)  Platelets 150 - 400 K/uL 498(H) 491(H) 520(H)   CMP Latest Ref Rng & Units 12/06/2019 11/23/2019 11/15/2019  Glucose 70 - 99 mg/dL 168(H) 110(H) 124(H)  BUN 8 - 23 mg/dL 15 14 26(H)  Creatinine 0.44 - 1.00 mg/dL 0.68 0.64 1.28(H)  Sodium 135 - 145 mmol/L 135 137 137  Potassium 3.5 - 5.1 mmol/L 3.4(L) 4.2 4.6  Chloride 98 - 111 mmol/L 95(L) 99 98  CO2 22 - 32 mmol/L _0 Calcium 8.9 - 10.3 mg/dL 8.9 9.0 9.1  Total Protein 6.5 - 8.1 g/dL 7.2 7.6 7.3  Total Bilirubin 0.3 - 1.2 mg/dL 0.6 0.4 0.6  Alkaline Phos 38 - 126 U/L 158(H) 159(H) 129(H)  AST 15 - 41 U/L 13(L) 14(L) 17  ALT 0 - 44 U/L _1 Lab Results  Component Value Date   LDH 667 (H) 12/06/2019   LDH 424 (H) 11/23/2019   LDH 648 (H) 11/15/2019    DIAGNOSTIC IMAGING:  I have independently reviewed the scans and discussed with the patient. No results found.   ASSESSMENT:  1.Metastatic renal cell carcinoma to the lymph nodes, liver and bones: -Evaluation for weight loss with CT CAP on 09/04/2019 showed 5 cm right  kidney mass, pathologically enlarged prevascular lymph nodes in the chest, enlarged left axillary lymph node, central mesenteric lymph node, adenopathy surrounding the abdominal aorta and numerous small bilateral pulmonary nodules. -MRI of the brain on 10/17/2019 with no metastatic disease. -LDH was elevated at 314. SPEP was negative. -PET scan on 10/01/2019 shows intense FDG uptake in the inferior pole of the right kidney, bulky retroperitoneal adenopathy, left cervical lymph node, left axilla and left retropectoral region, innumerable bone lesions, solitary FDG avid right hepatic lobe liver metastasis, small pulmonary nodules. -IMDC poor prognostic risk group, 4 points -Combination ipilimumab and nivolumab started on 11/07/2019.  2. Weight loss: -30 to 40 pound weight loss since January of this year. Initially had vomiting which subsided at this time. She does report occasional night sweats. -Colonoscopy on 08/31/2019 shows diverticulosis in the sigmoid colon, descending colon and and ascending colon. 2 nonbleeding colonic angiodysplastic lesions. One  16mllimeter polyp in the sigmoid colon. -EGD on 08/31/2019 shows 3 cm hiatal hernia, normal stomach, duodenum.  3. Social/family history: -Quit smoking 2 and half months ago. She lives alone with her dog. -Smoked 1 to 2 packs/day for 60 years. -No family history of malignancies.   PLAN:  1.Metastatic renal cell carcinoma: -She has tolerated first cycle of ipilimumab and nivolumab very well. -No immunotherapy related side effects noted. -Reviewed her LFTs today.  Alk phos was elevated at 158.  Bilirubin is normal.  CBC was grossly normal.  LDH elevated at 667. -She will proceed with cycle 2 today.  RTC 3 weeks for follow-up. -Plan to repeat PET scan after 3 cycles.  2. Weight loss: -She has gained weight at last visit.  Today weight was not checked as she was in a lot of pain.  3.Diffuse body pains: -She has diffuse body  pains, predominantly in the ribs and sternum. -She is taking oxycodone 5 mg every 6 hours as needed.  Denies any drowsiness or severe constipation. -She was in a lot of pain this morning.  We have given 2 mg of IV morphine in the clinic which helped with the pain. -I will increase her oxycodone to 5-10 mg every 6 hours as needed.  4. Microcytic anemia: -Ferritin was 367 with percent saturation of 9.  Today hemoglobin is 10.3 with MCV 76.9. -Consistent with anemia from chronic inflammation from malignancy.  5. Bone metastasis: -We will discuss and consider initiating her on denosumab at next visit.   Orders placed this encounter:  No orders of the defined types were placed in this encounter.    SDerek Jack MD [id number] [phone]  I, DMilinda Antis am acting as a scribe for Dr. SSanda Linger  I, SDerek JackMD, have reviewed the above documentation for accuracy and completeness, and I agree with the above.

## 2019-12-06 NOTE — Progress Notes (Signed)
Patient assessed and labs reviewed by Dr. Delton Coombes. Ok to proceed with treatment today. Please give Potassium 51mEq po once and 1g MagSulfate IVPB per Dr. Delton Coombes. Primary RN and Pharmacy aware.

## 2019-12-12 ENCOUNTER — Other Ambulatory Visit (HOSPITAL_COMMUNITY): Payer: Self-pay | Admitting: *Deleted

## 2019-12-12 DIAGNOSIS — C3490 Malignant neoplasm of unspecified part of unspecified bronchus or lung: Secondary | ICD-10-CM | POA: Diagnosis not present

## 2019-12-12 DIAGNOSIS — I10 Essential (primary) hypertension: Secondary | ICD-10-CM | POA: Diagnosis not present

## 2019-12-21 ENCOUNTER — Ambulatory Visit (HOSPITAL_COMMUNITY): Payer: Medicare HMO

## 2019-12-21 NOTE — Progress Notes (Signed)
Nutrition Follow-up:   Patient with metastatic renal cell carcinoma, liver nodules and lung nodules.  Patient receiving immunotherapy.   Spoke with patient via phone.  Patient reports that appetite is up and down.  She is receiving Meals and Wheels. Says the meals taste like cardboard.  Says that she can taste her own food that she cooks but the Meals on [place] does not taste good.  Says that she forces it down.  So far today has drank and ensure and whole milk.  Does not drink ensure more than 1-2 times per day as it causes diarrhea. Likes to snack on peanut butter crackers, cheese crackers, fruit, ice cream.  "I eat whatever I have a taste for." I don't ever know what I am going to eat."  Denies nausea.      Medications: remeron  Labs: reviewed  Anthropometrics:   No new weight since 11/5 of 79 lb.   NUTRITION DIAGNOSIS: Unintentional weight loss unknown as no new weight   INTERVENTION:  Will provide complimentary case of ensure plus for patient to pick up on 12/9, next clinic visit. Encouraged patient to eat what she can, when she can.   Discussed different flavorings patient could try on food to season it up.  All suggestions RD provided patient did not like.      MONITORING, EVALUATION, GOAL: weight trends, intake   NEXT VISIT: Jan 7 phone f/u  Rin Gorton B. Zenia Resides, [place], [place] Registered Dietitian [phone] (mobile)

## 2019-12-27 ENCOUNTER — Inpatient Hospital Stay (HOSPITAL_COMMUNITY): Payer: Medicare HMO

## 2019-12-27 ENCOUNTER — Inpatient Hospital Stay (HOSPITAL_COMMUNITY): Payer: Medicare HMO | Attending: Hematology and Oncology | Admitting: Hematology and Oncology

## 2019-12-27 ENCOUNTER — Other Ambulatory Visit: Payer: Self-pay

## 2019-12-27 ENCOUNTER — Ambulatory Visit (HOSPITAL_COMMUNITY)
Admission: RE | Admit: 2019-12-27 | Discharge: 2019-12-27 | Disposition: A | Discharge status: Home / Self Care | Payer: Medicare HMO | Source: Ambulatory Visit | Attending: Hematology and Oncology | Admitting: Hematology and Oncology

## 2019-12-27 ENCOUNTER — Other Ambulatory Visit (HOSPITAL_COMMUNITY): Payer: Self-pay

## 2019-12-27 VITALS — BP 179/93 | HR 116 | Temp 97.0°F | Resp 18

## 2019-12-27 DIAGNOSIS — Z87891 Personal history of nicotine dependence: Secondary | ICD-10-CM | POA: Diagnosis not present

## 2019-12-27 DIAGNOSIS — C787 Secondary malignant neoplasm of liver and intrahepatic bile duct: Secondary | ICD-10-CM | POA: Insufficient documentation

## 2019-12-27 DIAGNOSIS — Z79899 Other long term (current) drug therapy: Secondary | ICD-10-CM | POA: Insufficient documentation

## 2019-12-27 DIAGNOSIS — C649 Malignant neoplasm of unspecified kidney, except renal pelvis: Secondary | ICD-10-CM | POA: Insufficient documentation

## 2019-12-27 DIAGNOSIS — R600 Localized edema: Secondary | ICD-10-CM | POA: Insufficient documentation

## 2019-12-27 DIAGNOSIS — C7951 Secondary malignant neoplasm of bone: Secondary | ICD-10-CM | POA: Insufficient documentation

## 2019-12-27 DIAGNOSIS — R7402 Elevation of levels of lactic acid dehydrogenase (LDH): Secondary | ICD-10-CM | POA: Diagnosis not present

## 2019-12-27 DIAGNOSIS — R61 Generalized hyperhidrosis: Secondary | ICD-10-CM | POA: Insufficient documentation

## 2019-12-27 DIAGNOSIS — Z5112 Encounter for antineoplastic immunotherapy: Secondary | ICD-10-CM | POA: Insufficient documentation

## 2019-12-27 DIAGNOSIS — R609 Edema, unspecified: Secondary | ICD-10-CM | POA: Insufficient documentation

## 2019-12-27 DIAGNOSIS — Z85528 Personal history of other malignant neoplasm of kidney: Secondary | ICD-10-CM | POA: Diagnosis not present

## 2019-12-27 DIAGNOSIS — Z9221 Personal history of antineoplastic chemotherapy: Secondary | ICD-10-CM | POA: Diagnosis not present

## 2019-12-27 DIAGNOSIS — R918 Other nonspecific abnormal finding of lung field: Secondary | ICD-10-CM | POA: Diagnosis not present

## 2019-12-27 DIAGNOSIS — R634 Abnormal weight loss: Secondary | ICD-10-CM | POA: Insufficient documentation

## 2019-12-27 DIAGNOSIS — D509 Iron deficiency anemia, unspecified: Secondary | ICD-10-CM | POA: Diagnosis not present

## 2019-12-27 DIAGNOSIS — K449 Diaphragmatic hernia without obstruction or gangrene: Secondary | ICD-10-CM | POA: Insufficient documentation

## 2019-12-27 DIAGNOSIS — C641 Malignant neoplasm of right kidney, except renal pelvis: Secondary | ICD-10-CM

## 2019-12-27 LAB — COMPREHENSIVE METABOLIC PANEL
ALT: 10 U/L (ref 0–44)
AST: 13 U/L — ABNORMAL LOW (ref 15–41)
Albumin: 2.4 g/dL — ABNORMAL LOW (ref 3.5–5.0)
Alkaline Phosphatase: 140 U/L — ABNORMAL HIGH (ref 38–126)
Anion gap: 14 (ref 5–15)
BUN: 16 mg/dL (ref 8–23)
CO2: 25 mmol/L (ref 22–32)
Calcium: 9.1 mg/dL (ref 8.9–10.3)
Chloride: 98 mmol/L (ref 98–111)
Creatinine, Ser: 0.53 mg/dL (ref 0.44–1.00)
GFR, Estimated: >60 mL/min (ref >60)
Glucose, Bld: 88 mg/dL (ref 70–99)
Potassium: 4.3 mmol/L (ref 3.5–5.1)
Sodium: 137 mmol/L (ref 135–145)
Total Bilirubin: 0.6 mg/dL (ref 0.3–1.2)
Total Protein: 6.8 g/dL (ref 6.5–8.1)

## 2019-12-27 LAB — CBC WITH DIFFERENTIAL/PLATELET
Abs Immature Granulocytes: 0.06 10*3/uL (ref 0.00–0.07)
Basophils Absolute: 0 10*3/uL (ref 0.0–0.1)
Basophils Relative: 0 %
Eosinophils Absolute: 0 10*3/uL (ref 0.0–0.5)
Eosinophils Relative: 0 %
HCT: 27.7 % — ABNORMAL LOW (ref 36.0–46.0)
Hemoglobin: 8.1 g/dL — ABNORMAL LOW (ref 12.0–15.0)
Immature Granulocytes: 1 %
Lymphocytes Relative: 10 %
Lymphs Abs: 1.2 10*3/uL (ref 0.7–4.0)
MCH: 22.5 pg — ABNORMAL LOW (ref 26.0–34.0)
MCHC: 29.2 g/dL — ABNORMAL LOW (ref 30.0–36.0)
MCV: 76.9 fL — ABNORMAL LOW (ref 80.0–100.0)
Monocytes Absolute: 0.6 10*3/uL (ref 0.1–1.0)
Monocytes Relative: 5 %
Neutro Abs: 10.3 10*3/uL — ABNORMAL HIGH (ref 1.7–7.7)
Neutrophils Relative %: 84 %
Platelets: 497 10*3/uL — ABNORMAL HIGH (ref 150–400)
RBC: 3.6 MIL/uL — ABNORMAL LOW (ref 3.87–5.11)
RDW: 18.8 % — ABNORMAL HIGH (ref 11.5–15.5)
WBC: 12.2 10*3/uL — ABNORMAL HIGH (ref 4.0–10.5)
nRBC: 0 % (ref 0.0–0.2)

## 2019-12-27 LAB — TSH: TSH: 1.14 u[IU]/mL (ref 0.350–4.500)

## 2019-12-27 LAB — MAGNESIUM: Magnesium: 1.6 mg/dL — ABNORMAL LOW (ref 1.7–2.4)

## 2019-12-27 LAB — LACTATE DEHYDROGENASE: LDH: 671 U/L — ABNORMAL HIGH (ref 98–192)

## 2019-12-27 MED ORDER — SODIUM CHLORIDE 0.9 % IV SOLN: INTRAVENOUS | Status: DC

## 2019-12-27 MED ORDER — SODIUM CHLORIDE 0.9 % IV SOLN
1.0000 mg/kg | Freq: Once | INTRAVENOUS | Status: AC
  Administered 2019-12-27: 35 mg via INTRAVENOUS
  Filled 2019-12-27: qty 7

## 2019-12-27 MED ORDER — FAMOTIDINE IN NACL 20-0.9 MG/50ML-% IV SOLN
20.0000 mg | Freq: Once | INTRAVENOUS | Status: AC
  Administered 2019-12-27: 20 mg via INTRAVENOUS
  Filled 2019-12-27: qty 50

## 2019-12-27 MED ORDER — MORPHINE SULFATE (PF) 2 MG/ML IV SOLN
2.0000 mg | Freq: Once | INTRAVENOUS | Status: AC
  Administered 2019-12-27: 2 mg via INTRAVENOUS
  Filled 2019-12-27: qty 1

## 2019-12-27 MED ORDER — FENTANYL 25 MCG/HR TD PT72
1.0000 | MEDICATED_PATCH | TRANSDERMAL | 0 refills | Status: DC
Start: 2019-12-27 — End: 2020-02-04

## 2019-12-27 MED ORDER — SODIUM CHLORIDE 0.9 % IV SOLN
3.0800 mg/kg | Freq: Once | INTRAVENOUS | Status: AC
  Administered 2019-12-27: 100 mg via INTRAVENOUS
  Filled 2019-12-27: qty 10

## 2019-12-27 MED ORDER — DIPHENHYDRAMINE HCL 50 MG/ML IJ SOLN
25.0000 mg | Freq: Once | INTRAMUSCULAR | Status: AC
  Administered 2019-12-27: 25 mg via INTRAVENOUS
  Filled 2019-12-27: qty 1

## 2019-12-27 NOTE — Progress Notes (Signed)
Patient Care Team: Rosita Fire, MD as PCP - General (Internal Medicine) Irine Seal, MD as Attending Physician (Urology) Leta Baptist, MD (Otolaryngology) Dishmon, Garwin Brothers, RN as Oncology Nurse Navigator (Oncology)  DIAGNOSIS:  Encounter Diagnosis  Name Primary?  . Primary malignant neoplasm of kidney with metastasis from kidney to other site, unspecified laterality ([place])     SUMMARY OF ONCOLOGIC HISTORY: Oncology History  Primary malignant neoplasm of kidney with metastasis from kidney to other site [facility])  11/12/2019 Initial Diagnosis   Primary malignant neoplasm of kidney with metastasis from kidney to other site [facility])   11/15/2019 -  Chemotherapy   The patient had ipilimumab (YERVOY) 35 mg in sodium chloride 0.9 % 50 mL chemo infusion, 1 mg/kg = 35 mg, Intravenous,  Once, 2 of 4 cycles Administration: 35 mg (11/15/2019), 35 mg (12/06/2019) nivolumab (OPDIVO) 100 mg in sodium chloride 0.9 % 50 mL chemo infusion, 3.08 mg/kg = 98 mg, Intravenous, Once, 2 of 9 cycles Administration: 100 mg (11/15/2019), 100 mg (12/06/2019)  for chemotherapy treatment.      CHIEF COMPLIANT: Follow-up of metastatic renal cell cancer complaining of diffuse body pains  INTERVAL HISTORY: Alicia [name] is a 79 year old above-mentioned history metastatic renal cancer who is here for Opdivo along with [place].  She is tolerating the treatment fairly well.  Her major complaints today of diffuse body aches and pains she rates it as 9-10 out of 10.  She had was prescribed oxycodone but she tells me that there do not seem to work very well.  She also complaining of lower extremity swelling over the past 3 weeks.  She tells me that she is in excruciating pain all day and that she is  suffering extremely.   ALLERGIES:  is allergic to aspirin.  MEDICATIONS:  Current Outpatient Medications  Medication Sig Dispense Refill  . albuterol (VENTOLIN HFA) 108 (90 Base) MCG/ACT inhaler Inhale into the lungs.    Marland Kitchen  amLODipine (NORVASC) 10 MG tablet Take 10 mg by mouth daily.    . cilostazol (PLETAL) 100 MG tablet Take 100 mg by mouth 2 (two) times daily.    . Ensure (ENSURE) Take 237 mLs by mouth 2 (two) times daily.    . fluticasone (FLONASE) 50 MCG/ACT nasal spray Place 1 spray into both nostrils daily.    Marland Kitchen gabapentin (NEURONTIN) 100 MG capsule Take 200 mg by mouth at bedtime.    Marland Kitchen Ipilimumab (YERVOY IV) Inject into the vein every 21 ( twenty-one) days. X 4 cycles then d/c    . losartan (COZAAR) 50 MG tablet Take 50 mg by mouth daily.    . magnesium oxide (MAG-OX) 400 (241.3 Mg) MG tablet Take 1 tablet (400 mg total) by mouth daily. 10 tablet 0  . mirtazapine (REMERON) 15 MG tablet Take 15 mg by mouth at bedtime.    . Nivolumab (OPDIVO IV) Inject into the vein every 21 ( twenty-one) days. X 4 cycles, then q 28 days    . oxybutynin (DITROPAN-XL) 10 MG 24 hr tablet Take 10 mg by mouth at bedtime.    Marland Kitchen oxyCODONE (OXY IR/ROXICODONE) 5 MG immediate release tablet Take 1-2 tablets (5-10 mg total) by mouth every 6 (six) hours as needed for severe pain. 120 tablet 0  . risedronate (ACTONEL) 35 MG tablet Take 35 mg by mouth every Thursday. with water on empty stomach, nothing by mouth or lie down for next 30 minutes.    . simvastatin (ZOCOR) 20 MG tablet Take 20 mg  by mouth at bedtime.     No current facility-administered medications for this visit.   Facility-Administered Medications Ordered in Other Visits  Medication Dose Route Frequency Provider Last Rate Last Admin  . 0.9 %  sodium chloride infusion   Intravenous Continuous Nicholas Lose, MD 75 mL/hr at 12/27/19 1337 New Bag at 12/27/19 1337  . fentaNYL (SUBLIMAZE) injection 25-50 mcg  25-50 mcg Intravenous Q5 min PRN Lerry Liner, MD        PHYSICAL EXAMINATION: ECOG PERFORMANCE STATUS: 1 - Symptomatic but completely ambulatory  Vitals:   12/27/19 1307  BP: (!) 175/103  Pulse: (!) 124  Resp: 18  Temp: (!) 97 F (36.1 C)  SpO2: 97%   Filed  Weights   12/27/19 1307  Weight: 67 lb 12.8 oz (30.8 kg)       LABORATORY DATA:  I have reviewed the data as listed CMP Latest Ref Rng & Units 12/06/2019 11/23/2019 11/15/2019  Glucose 70 - 99 mg/dL 168(H) 110(H) 124(H)  BUN 8 - 23 mg/dL 15 14 26(H)  Creatinine 0.44 - 1.00 mg/dL 0.68 0.64 1.28(H)  Sodium 135 - 145 mmol/L 135 137 137  Potassium 3.5 - 5.1 mmol/L 3.4(L) 4.2 4.6  Chloride 98 - 111 mmol/L 95(L) 99 98  CO2 22 - 32 mmol/L 26 25 26   Calcium 8.9 - 10.3 mg/dL 8.9 9.0 9.1  Total Protein 6.5 - 8.1 g/dL 7.2 7.6 7.3  Total Bilirubin 0.3 - 1.2 mg/dL 0.6 0.4 0.6  Alkaline Phos 38 - 126 U/L 158(H) 159(H) 129(H)  AST 15 - 41 U/L 13(L) 14(L) 17  ALT 0 - 44 U/L 11 13 10     Lab Results  Component Value Date   WBC 10.3 12/06/2019   HGB 10.3 (L) 12/06/2019   HCT 35.3 (L) 12/06/2019   MCV 76.9 (L) 12/06/2019   PLT 498 (H) 12/06/2019   NEUTROABS 8.8 (H) 12/06/2019    ASSESSMENT & PLAN:  Primary malignant neoplasm of kidney with metastasis from kidney to other site [facility]) 1.Metastatic renal cell carcinoma to the lymph nodes, liver and bones: -Evaluation for weight loss with CT CAP on 09/04/2019 showed 5 cm right kidney mass, pathologically enlarged prevascular lymph nodes in the chest, enlarged left axillary lymph node, central mesenteric lymph node, adenopathy surrounding the abdominal aorta and numerous small bilateral pulmonary nodules. -MRI of the brain on 10/17/2019 with no metastatic disease. -LDH was elevated at 314. SPEP was negative. -PET scan on 10/01/2019 shows intense FDG uptake in the inferior pole of the right kidney, bulky retroperitoneal adenopathy, left cervical lymph node, left axilla and left retropectoral region, innumerable bone lesions, solitary FDG avid right hepatic lobe liver metastasis, small pulmonary nodules. -IMDC poor prognostic risk group, 4 points -Combination ipilimumab and nivolumab started on 11/07/2019.  2. Weight loss: -30 to 40 pound weight  loss since January of this year. Initially had vomiting which subsided at this time. She does report occasional night sweats. -Colonoscopy on 08/31/2019 shows diverticulosis in the sigmoid colon, descending colon and and ascending colon. 2 nonbleeding colonic angiodysplastic lesions. One 75millimeter polyp in the sigmoid colon. -EGD on 08/31/2019 shows 3 cm hiatal hernia, normal stomach, duodenum.  3. Social/family history: -Quit smoking 2 and half months ago. She lives alone with her dog. -Smoked 1 to 2 packs/day for 60 years. -No family history of malignancies.   PLAN:  1.Metastatic renal cell carcinoma: -Today is cycle 3 of ipilimumab and nivolumab  -No immunotherapy related side effects noted. Decided to hold off  on ordering scans at this time I felt that PET/CT may not be approved so soon since the last scan was done in September.  2. Weight loss: Monitoring  3.Diffuse body pains: -She has diffuse body pains, predominantly in the ribs and sternum. -She is taking oxycodone 5 mg every 6 hours as needed.  Denies any drowsiness or severe constipation. -Currently she takes oxycodone to 5-10 mg every 6 hours as needed.  -Because of continued worsening pain, I prescribed fentanyl 25 mcg patch.  I instructed her that if she starts to feel drowsy then she should remove it.  4. Microcytic anemia: -Ferritin was 367 with percent saturation of 9.  Today hemoglobin is 10.3 with MCV 76.9. -Consistent with anemia from chronic inflammation from malignancy.  5. Bone metastasis: -Recommended adding Xgeva to her treatment with the next cycle.  She does not have good teeth but I do not see any teeth that need to be extracted at this time.  6.  Lower extremity edema: Will obtain ultrasound of the legs for further evaluation for blood clots.   No orders of the defined types were placed in this encounter.  The patient has a good understanding of the overall plan. she agrees with  it. she will call with any problems that may develop before the next visit here. Total time spent: 60 mins including face to face time and time spent for planning, charting and co-ordination of care   Harriette Ohara, MD 12/27/19

## 2019-12-27 NOTE — Patient Instructions (Signed)
[place] at [facility] Discharge Instructions  You were seen today by Dr. Lindi Adie. Follow up as scheduled.   Thank you for choosing [place] at [facility] to provide your oncology and hematology care.  To afford each patient quality time with our provider, please arrive at least 15 minutes before your scheduled appointment time.   If you have a lab appointment with the [place] please come in thru the Main Entrance and check in at the main information desk.  You need to re-schedule your appointment should you arrive 10 or more minutes late.  We strive to give you quality time with our providers, and arriving late affects you and other patients whose appointments are after yours.  Also, if you no show three or more times for appointments you may be dismissed from the clinic at the providers discretion.     Again, thank you for choosing [facility].  Our hope is that these requests will decrease the amount of time that you wait before being seen by our physicians.       _____________________________________________________________  Should you have questions after your visit to [facility], please contact our office at [phone] and follow the prompts.  Our office hours are 8:00 a.m. and 4:30 p.m. Monday - Friday.  Please note that voicemails left after 4:00 p.m. may not be returned until the following business day.  We are closed weekends and major holidays.  You do have access to a nurse 24-7, just call the main number to the clinic [phone] and do not press any options, hold on the line and a nurse will answer the phone.    For prescription refill requests, have your pharmacy contact our office and allow 72 hours.    Due to Covid, you will need to wear a mask upon entering the hospital. If you do not have a mask, a mask will be given to you at the Main Entrance upon arrival. For doctor visits, patients may  have 1 support person age 51 or older with them. For treatment visits, patients can not have anyone with them due to social distancing guidelines and our immunocompromised population.

## 2019-12-27 NOTE — Patient Instructions (Signed)
[facility] Discharge Instructions for Patients Receiving Chemotherapy  Today you received the following chemotherapy agents   To help prevent nausea and vomiting after your treatment, we encourage you to take your nausea medication   If you develop nausea and vomiting that is not controlled by your nausea medication, call the clinic.   BELOW ARE SYMPTOMS THAT SHOULD BE REPORTED IMMEDIATELY:  *FEVER GREATER THAN 100.5 F  *CHILLS WITH OR WITHOUT FEVER  NAUSEA AND VOMITING THAT IS NOT CONTROLLED WITH YOUR NAUSEA MEDICATION  *UNUSUAL SHORTNESS OF BREATH  *UNUSUAL BRUISING OR BLEEDING  TENDERNESS IN MOUTH AND THROAT WITH OR WITHOUT PRESENCE OF ULCERS  *URINARY PROBLEMS  *BOWEL PROBLEMS  UNUSUAL RASH Items with * indicate a potential emergency and should be followed up as soon as possible.  Feel free to call the clinic should you have any questions or concerns. The clinic phone number is [phone].  Please show the CHEMO ALERT CARD at check-in to the Emergency Department and triage nurse.   

## 2019-12-27 NOTE — Progress Notes (Signed)
Patient presents today for treatment and follow up appointment with Dr. Lindi Adie. BP on arrival 175/103 and heart rate 124. Patient has complaints of epigastric pain that radiates to her back. Patient states her home medications are no longer working. Dr. Lindi Adie at the bedside with patient.  Patient called the clinic earlier to cancel her appointment due to pain and she was instructed by the staff to come in and be seen. Patient had a bowel movement this morning. Patient states she is hungry . No nausea noted. Patient given crackers and a cola. Patient states her right foot is swollen. Patient states she had a fall since we last saw her.   Patient seen by Dr. Lindi Adie. Verbal order received to proceed with treatment. Give 2 mg of Morphine x 1 dose IV now.  Reported blood pressure of 175/103 via instant message. Patient did NOT take any of her blood pressure medications today per patient. Labs within parameters for treatment. Message received from Dr. Lindi Adie to proceed with treatment.   BP recheck 161/90 and heart rate 122. Reported to Dr. Lindi Adie via instant message.   Treatment given today per MD orders. Tolerated infusion without adverse affects. Vital signs stable. No complaints at this time. Discharged from clinic via wheel chair in stable condition. Alert and oriented x 3. F/U with [facility] as scheduled.

## 2019-12-27 NOTE — Assessment & Plan Note (Signed)
1.Metastatic renal cell carcinoma to the lymph nodes, liver and bones: -Evaluation for weight loss with CT CAP on 09/04/2019 showed 5 cm right kidney mass, pathologically enlarged prevascular lymph nodes in the chest, enlarged left axillary lymph node, central mesenteric lymph node, adenopathy surrounding the abdominal aorta and numerous small bilateral pulmonary nodules. -MRI of the brain on 10/17/2019 with no metastatic disease. -LDH was elevated at 314. SPEP was negative. -PET scan on 10/01/2019 shows intense FDG uptake in the inferior pole of the right kidney, bulky retroperitoneal adenopathy, left cervical lymph node, left axilla and left retropectoral region, innumerable bone lesions, solitary FDG avid right hepatic lobe liver metastasis, small pulmonary nodules. -IMDC poor prognostic risk group, 4 points -Combination ipilimumab and nivolumab started on 11/07/2019.  2. Weight loss: -30 to 40 pound weight loss since January of this year. Initially had vomiting which subsided at this time. She does report occasional night sweats. -Colonoscopy on 08/31/2019 shows diverticulosis in the sigmoid colon, descending colon and and ascending colon. 2 nonbleeding colonic angiodysplastic lesions. One 28millimeter polyp in the sigmoid colon. -EGD on 08/31/2019 shows 3 cm hiatal hernia, normal stomach, duodenum.  3. Social/family history: -Quit smoking 2 and half months ago. She lives alone with her dog. -Smoked 1 to 2 packs/day for 60 years. -No family history of malignancies.   PLAN:  1.Metastatic renal cell carcinoma: -Today is cycle 3 of ipilimumab and nivolumab  -No immunotherapy related side effects noted. - RTC 3 weeks for follow-up. We will order PET CT scan to be done prior to cycle 4.  2. Weight loss: Monitoring  3.Diffuse body pains: -She has diffuse body pains, predominantly in the ribs and sternum. -She is taking oxycodone 5 mg every 6 hours as needed.  Denies  any drowsiness or severe constipation. -Currently she takes oxycodone to 5-10 mg every 6 hours as needed.  4. Microcytic anemia: -Ferritin was 367 with percent saturation of 9.  Today hemoglobin is 10.3 with MCV 76.9. -Consistent with anemia from chronic inflammation from malignancy.  5. Bone metastasis: -Recommended adding Xgeva to her treatment

## 2019-12-31 ENCOUNTER — Ambulatory Visit (HOSPITAL_COMMUNITY): Payer: Medicare HMO

## 2020-01-03 DIAGNOSIS — C7951 Secondary malignant neoplasm of bone: Secondary | ICD-10-CM | POA: Diagnosis not present

## 2020-01-03 DIAGNOSIS — E46 Unspecified protein-calorie malnutrition: Secondary | ICD-10-CM | POA: Diagnosis not present

## 2020-01-03 DIAGNOSIS — C787 Secondary malignant neoplasm of liver and intrahepatic bile duct: Secondary | ICD-10-CM | POA: Diagnosis not present

## 2020-01-03 DIAGNOSIS — R000 Tachycardia, unspecified: Secondary | ICD-10-CM | POA: Diagnosis not present

## 2020-01-03 DIAGNOSIS — C779 Secondary and unspecified malignant neoplasm of lymph node, unspecified: Secondary | ICD-10-CM | POA: Diagnosis not present

## 2020-01-03 DIAGNOSIS — C641 Malignant neoplasm of right kidney, except renal pelvis: Secondary | ICD-10-CM | POA: Diagnosis not present

## 2020-01-03 DIAGNOSIS — Z515 Encounter for palliative care: Secondary | ICD-10-CM | POA: Diagnosis not present

## 2020-01-09 ENCOUNTER — Ambulatory Visit: Payer: Medicare HMO | Admitting: Urology

## 2020-01-11 DIAGNOSIS — J449 Chronic obstructive pulmonary disease, unspecified: Secondary | ICD-10-CM | POA: Diagnosis not present

## 2020-01-11 DIAGNOSIS — I10 Essential (primary) hypertension: Secondary | ICD-10-CM | POA: Diagnosis not present

## 2020-01-21 ENCOUNTER — Ambulatory Visit (HOSPITAL_COMMUNITY): Payer: Medicare HMO | Admitting: Hematology

## 2020-01-21 ENCOUNTER — Inpatient Hospital Stay (HOSPITAL_COMMUNITY): Payer: Medicare HMO

## 2020-01-21 ENCOUNTER — Ambulatory Visit (HOSPITAL_COMMUNITY): Payer: Medicare HMO

## 2020-01-25 ENCOUNTER — Ambulatory Visit (HOSPITAL_COMMUNITY): Payer: Medicare HMO

## 2020-01-25 NOTE — Progress Notes (Signed)
Nutrition  Message received from nursing regarding RD's earlier message about patient with swollen tongue and lips.  Instructions were for patient to call [phone] and not to press any options and nurse from call center will answer and speak with patient.    Called patient back this afternoon and provided above information.  Patient reports that she is laying down and does not want to get up and write the number down. She wants to wait until Tuesday, 1/11 to discuss with Dr Raliegh Ip.  She does not want to call on call nurse at this time. Explained that the number is the main cancer center number and if needed over the weekend she can call.  She verbalized understanding.  Anselma Herbel B. Zenia Resides, [place], [place] Registered Dietitian [phone] (mobile)

## 2020-01-25 NOTE — Progress Notes (Signed)
Nutrition Follow-up:  Patient with metastatic renal cell carcinoma, liver, nodules and lung nodules.  Patient receiving opdivo and yervoy.    Spoke with patient via phone.  Patient reports appetite is up and down.  Reports that for the last 3 weeks she has had swollen lips and tongue and she thinks it is related to treatment.  She says that she has been drinking out of straw due to lip swelling and what she has been able to eat has been limited.  Denies shortness of breath and is swallowing ok.  Says that she has eaten only cereal today. Has been drinking ensure 2-3 times per day    Medications: remeron  Labs: reviewed  Anthropometrics:   Weight 67 lbs on 12/9 decreased from 79 lb on 11/5   NUTRITION DIAGNOSIS: Unintentional weight loss continues  INTERVENTION:  Message sent to provider regarding swollen lips and tongue. Patient to discuss with MD on 1/12 at next visit. Will provide another complimentary case of ensure for patient to pick up on 1/11 at next appointment Encouraged her to consume high calories and high protein foods.  Continue appetite stimulant    MONITORING, EVALUATION, GOAL: weight trends   NEXT VISIT: Feb 11 phone f/u  Pattiann Solanki B. Zenia Resides, [place], [place] Registered Dietitian [phone] (mobile)

## 2020-01-29 ENCOUNTER — Emergency Department (HOSPITAL_COMMUNITY)

## 2020-01-29 ENCOUNTER — Inpatient Hospital Stay (HOSPITAL_COMMUNITY): Attending: Hematology

## 2020-01-29 ENCOUNTER — Other Ambulatory Visit: Payer: Self-pay

## 2020-01-29 ENCOUNTER — Inpatient Hospital Stay (HOSPITAL_BASED_OUTPATIENT_CLINIC_OR_DEPARTMENT_OTHER): Admitting: Hematology

## 2020-01-29 ENCOUNTER — Encounter (HOSPITAL_COMMUNITY): Payer: Self-pay | Admitting: General Practice

## 2020-01-29 ENCOUNTER — Encounter (HOSPITAL_COMMUNITY): Payer: Self-pay | Admitting: Emergency Medicine

## 2020-01-29 ENCOUNTER — Inpatient Hospital Stay (HOSPITAL_COMMUNITY)

## 2020-01-29 ENCOUNTER — Inpatient Hospital Stay (HOSPITAL_COMMUNITY)
Admission: EM | Admit: 2020-01-29 | Discharge: 2020-01-30 | DRG: 871 | Disposition: A | Discharge status: Hospice | Source: Ambulatory Visit | Attending: Internal Medicine | Admitting: Internal Medicine

## 2020-01-29 VITALS — BP 156/96 | HR 128 | Temp 96.8°F | Resp 16

## 2020-01-29 DIAGNOSIS — Z8249 Family history of ischemic heart disease and other diseases of the circulatory system: Secondary | ICD-10-CM

## 2020-01-29 DIAGNOSIS — G43909 Migraine, unspecified, not intractable, without status migrainosus: Secondary | ICD-10-CM | POA: Diagnosis present

## 2020-01-29 DIAGNOSIS — Z66 Do not resuscitate: Secondary | ICD-10-CM | POA: Diagnosis present

## 2020-01-29 DIAGNOSIS — M1990 Unspecified osteoarthritis, unspecified site: Secondary | ICD-10-CM | POA: Diagnosis present

## 2020-01-29 DIAGNOSIS — K581 Irritable bowel syndrome with constipation: Secondary | ICD-10-CM | POA: Diagnosis present

## 2020-01-29 DIAGNOSIS — I10 Essential (primary) hypertension: Secondary | ICD-10-CM | POA: Diagnosis present

## 2020-01-29 DIAGNOSIS — C641 Malignant neoplasm of right kidney, except renal pelvis: Secondary | ICD-10-CM

## 2020-01-29 DIAGNOSIS — J189 Pneumonia, unspecified organism: Secondary | ICD-10-CM | POA: Diagnosis present

## 2020-01-29 DIAGNOSIS — E876 Hypokalemia: Secondary | ICD-10-CM | POA: Diagnosis present

## 2020-01-29 DIAGNOSIS — R6520 Severe sepsis without septic shock: Secondary | ICD-10-CM | POA: Diagnosis not present

## 2020-01-29 DIAGNOSIS — E785 Hyperlipidemia, unspecified: Secondary | ICD-10-CM | POA: Diagnosis present

## 2020-01-29 DIAGNOSIS — J440 Chronic obstructive pulmonary disease with acute lower respiratory infection: Secondary | ICD-10-CM | POA: Diagnosis present

## 2020-01-29 DIAGNOSIS — F32A Depression, unspecified: Secondary | ICD-10-CM | POA: Diagnosis present

## 2020-01-29 DIAGNOSIS — Z886 Allergy status to analgesic agent status: Secondary | ICD-10-CM

## 2020-01-29 DIAGNOSIS — I739 Peripheral vascular disease, unspecified: Secondary | ICD-10-CM | POA: Diagnosis present

## 2020-01-29 DIAGNOSIS — Z823 Family history of stroke: Secondary | ICD-10-CM

## 2020-01-29 DIAGNOSIS — E440 Moderate protein-calorie malnutrition: Secondary | ICD-10-CM | POA: Diagnosis present

## 2020-01-29 DIAGNOSIS — R55 Syncope and collapse: Secondary | ICD-10-CM | POA: Diagnosis present

## 2020-01-29 DIAGNOSIS — R059 Cough, unspecified: Secondary | ICD-10-CM | POA: Diagnosis present

## 2020-01-29 DIAGNOSIS — E872 Acidosis, unspecified: Secondary | ICD-10-CM | POA: Diagnosis present

## 2020-01-29 DIAGNOSIS — Z515 Encounter for palliative care: Secondary | ICD-10-CM

## 2020-01-29 DIAGNOSIS — R531 Weakness: Secondary | ICD-10-CM | POA: Diagnosis not present

## 2020-01-29 DIAGNOSIS — A419 Sepsis, unspecified organism: Principal | ICD-10-CM | POA: Diagnosis present

## 2020-01-29 DIAGNOSIS — Z79899 Other long term (current) drug therapy: Secondary | ICD-10-CM

## 2020-01-29 DIAGNOSIS — C649 Malignant neoplasm of unspecified kidney, except renal pelvis: Secondary | ICD-10-CM | POA: Diagnosis not present

## 2020-01-29 DIAGNOSIS — Z87891 Personal history of nicotine dependence: Secondary | ICD-10-CM

## 2020-01-29 DIAGNOSIS — Z9181 History of falling: Secondary | ICD-10-CM | POA: Diagnosis not present

## 2020-01-29 DIAGNOSIS — F419 Anxiety disorder, unspecified: Secondary | ICD-10-CM | POA: Diagnosis present

## 2020-01-29 DIAGNOSIS — Z20822 Contact with and (suspected) exposure to covid-19: Secondary | ICD-10-CM | POA: Diagnosis present

## 2020-01-29 DIAGNOSIS — Z8711 Personal history of peptic ulcer disease: Secondary | ICD-10-CM

## 2020-01-29 DIAGNOSIS — C799 Secondary malignant neoplasm of unspecified site: Secondary | ICD-10-CM | POA: Diagnosis present

## 2020-01-29 LAB — BASIC METABOLIC PANEL
Anion gap: 14 (ref 5–15)
BUN: 20 mg/dL (ref 8–23)
CO2: 22 mmol/L (ref 22–32)
Calcium: 8.2 mg/dL — ABNORMAL LOW (ref 8.9–10.3)
Chloride: 100 mmol/L (ref 98–111)
Creatinine, Ser: 0.63 mg/dL (ref 0.44–1.00)
GFR, Estimated: >60 mL/min (ref >60)
Glucose, Bld: 158 mg/dL — ABNORMAL HIGH (ref 70–99)
Potassium: 3.4 mmol/L — ABNORMAL LOW (ref 3.5–5.1)
Sodium: 136 mmol/L (ref 135–145)

## 2020-01-29 LAB — COMPREHENSIVE METABOLIC PANEL
ALT: 12 U/L (ref 0–44)
AST: 20 U/L (ref 15–41)
Albumin: 2.5 g/dL — ABNORMAL LOW (ref 3.5–5.0)
Alkaline Phosphatase: 155 U/L — ABNORMAL HIGH (ref 38–126)
Anion gap: 17 — ABNORMAL HIGH (ref 5–15)
BUN: 17 mg/dL (ref 8–23)
CO2: 23 mmol/L (ref 22–32)
Calcium: 8.9 mg/dL (ref 8.9–10.3)
Chloride: 97 mmol/L — ABNORMAL LOW (ref 98–111)
Creatinine, Ser: 0.57 mg/dL (ref 0.44–1.00)
GFR, Estimated: >60 mL/min (ref >60)
Glucose, Bld: 97 mg/dL (ref 70–99)
Potassium: 3.9 mmol/L (ref 3.5–5.1)
Sodium: 137 mmol/L (ref 135–145)
Total Bilirubin: 0.4 mg/dL (ref 0.3–1.2)
Total Protein: 7.5 g/dL (ref 6.5–8.1)

## 2020-01-29 LAB — CBC WITH DIFFERENTIAL/PLATELET
Abs Immature Granulocytes: 0.14 10*3/uL — ABNORMAL HIGH (ref 0.00–0.07)
Abs Immature Granulocytes: 0.16 10*3/uL — ABNORMAL HIGH (ref 0.00–0.07)
Basophils Absolute: 0 10*3/uL (ref 0.0–0.1)
Basophils Absolute: 0 10*3/uL (ref 0.0–0.1)
Basophils Relative: 0 %
Basophils Relative: 0 %
Eosinophils Absolute: 0 10*3/uL (ref 0.0–0.5)
Eosinophils Absolute: 0 10*3/uL (ref 0.0–0.5)
Eosinophils Relative: 0 %
Eosinophils Relative: 0 %
HCT: 30.8 % — ABNORMAL LOW (ref 36.0–46.0)
HCT: 25.7 % — ABNORMAL LOW (ref 36.0–46.0)
Hemoglobin: 8.6 g/dL — ABNORMAL LOW (ref 12.0–15.0)
Hemoglobin: 7.2 g/dL — ABNORMAL LOW (ref 12.0–15.0)
Immature Granulocytes: 1 %
Immature Granulocytes: 1 %
Lymphocytes Relative: 10 %
Lymphocytes Relative: 10 %
Lymphs Abs: 1.7 10*3/uL (ref 0.7–4.0)
Lymphs Abs: 1.7 10*3/uL (ref 0.7–4.0)
MCH: 21.6 pg — ABNORMAL LOW (ref 26.0–34.0)
MCH: 22 pg — ABNORMAL LOW (ref 26.0–34.0)
MCHC: 27.9 g/dL — ABNORMAL LOW (ref 30.0–36.0)
MCHC: 28 g/dL — ABNORMAL LOW (ref 30.0–36.0)
MCV: 77.4 fL — ABNORMAL LOW (ref 80.0–100.0)
MCV: 78.4 fL — ABNORMAL LOW (ref 80.0–100.0)
Monocytes Absolute: 0.7 10*3/uL (ref 0.1–1.0)
Monocytes Absolute: 1.2 10*3/uL — ABNORMAL HIGH (ref 0.1–1.0)
Monocytes Relative: 4 %
Monocytes Relative: 7 %
Neutro Abs: 14.5 10*3/uL — ABNORMAL HIGH (ref 1.7–7.7)
Neutro Abs: 14.5 10*3/uL — ABNORMAL HIGH (ref 1.7–7.7)
Neutrophils Relative %: 85 %
Neutrophils Relative %: 82 %
Platelets: 722 10*3/uL — ABNORMAL HIGH (ref 150–400)
Platelets: 440 10*3/uL — ABNORMAL HIGH (ref 150–400)
RBC: 3.98 MIL/uL (ref 3.87–5.11)
RBC: 3.28 MIL/uL — ABNORMAL LOW (ref 3.87–5.11)
RDW: 19.3 % — ABNORMAL HIGH (ref 11.5–15.5)
RDW: 19.2 % — ABNORMAL HIGH (ref 11.5–15.5)
WBC: 17.1 10*3/uL — ABNORMAL HIGH (ref 4.0–10.5)
WBC: 17.6 10*3/uL — ABNORMAL HIGH (ref 4.0–10.5)
nRBC: 0.3 % — ABNORMAL HIGH (ref 0.0–0.2)
nRBC: 0.2 % (ref 0.0–0.2)

## 2020-01-29 LAB — URINALYSIS, ROUTINE W REFLEX MICROSCOPIC
Bacteria, UA: NONE SEEN
Bilirubin Urine: NEGATIVE
Glucose, UA: NEGATIVE mg/dL
Hgb urine dipstick: NEGATIVE
Ketones, ur: NEGATIVE mg/dL
Leukocytes,Ua: NEGATIVE
Nitrite: NEGATIVE
Protein, ur: 30 mg/dL — AB
Specific Gravity, Urine: 1.016 (ref 1.005–1.030)
pH: 5 (ref 5.0–8.0)

## 2020-01-29 LAB — TSH: TSH: 1.393 u[IU]/mL (ref 0.350–4.500)

## 2020-01-29 LAB — PROCALCITONIN: Procalcitonin: 0.21 ng/mL

## 2020-01-29 LAB — LACTATE DEHYDROGENASE: LDH: 1033 U/L — ABNORMAL HIGH (ref 98–192)

## 2020-01-29 LAB — MAGNESIUM: Magnesium: 1.6 mg/dL — ABNORMAL LOW (ref 1.7–2.4)

## 2020-01-29 LAB — LACTIC ACID, PLASMA: Lactic Acid, Venous: 5.3 mmol/L (ref 0.5–1.9)

## 2020-01-29 MED ORDER — VANCOMYCIN HCL 500 MG/100ML IV SOLN
500.0000 mg | INTRAVENOUS | Status: DC
  Administered 2020-01-29: 500 mg via INTRAVENOUS
  Filled 2020-01-29 (×2): qty 100

## 2020-01-29 MED ORDER — VANCOMYCIN HCL IN DEXTROSE 1-5 GM/200ML-% IV SOLN
1000.0000 mg | Freq: Once | INTRAVENOUS | Status: DC
  Administered 2020-01-29: 1000 mg via INTRAVENOUS
  Filled 2020-01-29: qty 200

## 2020-01-29 MED ORDER — METRONIDAZOLE IN NACL 5-0.79 MG/ML-% IV SOLN
500.0000 mg | Freq: Once | INTRAVENOUS | Status: AC
  Administered 2020-01-29: 500 mg via INTRAVENOUS
  Filled 2020-01-29: qty 100

## 2020-01-29 MED ORDER — LACTATED RINGERS IV SOLN: INTRAVENOUS | Status: DC

## 2020-01-29 MED ORDER — SODIUM CHLORIDE 0.9 % IV BOLUS
500.0000 mL | Freq: Once | INTRAVENOUS | Status: AC
  Administered 2020-01-29: 500 mL via INTRAVENOUS

## 2020-01-29 MED ORDER — SODIUM CHLORIDE 0.9 % IV SOLN
2.0000 g | Freq: Once | INTRAVENOUS | Status: DC
  Filled 2020-01-29: qty 2

## 2020-01-29 MED ORDER — LACTATED RINGERS IV BOLUS (SEPSIS)
500.0000 mL | Freq: Once | INTRAVENOUS | Status: AC
  Administered 2020-01-29: 500 mL via INTRAVENOUS

## 2020-01-29 MED ORDER — MORPHINE SULFATE (PF) 2 MG/ML IV SOLN
2.0000 mg | Freq: Once | INTRAVENOUS | Status: AC
  Administered 2020-01-29: 2 mg via INTRAVENOUS

## 2020-01-29 MED ORDER — POTASSIUM CHLORIDE 20 MEQ PO PACK
20.0000 meq | PACK | Freq: Once | ORAL | Status: AC
  Administered 2020-01-29: 20 meq via ORAL
  Filled 2020-01-29: qty 1

## 2020-01-29 MED ORDER — MORPHINE SULFATE (PF) 2 MG/ML IV SOLN
INTRAVENOUS | Status: AC
  Filled 2020-01-29: qty 1

## 2020-01-29 MED ORDER — SODIUM CHLORIDE 0.9 % IV SOLN
1.0000 g | INTRAVENOUS | Status: DC
  Administered 2020-01-29: 1 g via INTRAVENOUS
  Filled 2020-01-29 (×2): qty 1

## 2020-01-29 NOTE — H&P (Signed)
TRH H&P    Patient Demographics:    Alicia [name], is a 80 y.o. female  MRN: [id number]  DOB - 1940-12-26  Admit Date - 01/29/2020  Referring MD/NP/PA: Melina Copa  Outpatient Primary MD for the patient is Rosita Fire, MD  Patient coming from: Home  Chief complaint- Generalized weakness   HPI:    Alicia [name]  is a 80 y.o. female, with history of peripheral vascular disease peptic ulcer disease, hypertension, hyperlipidemia, renal cancer with mets, depression, COPD, anxiety, and more presents ED with a chief complaint of generalized weakness.  Apparently patient was seen by Dr. Raliegh Ip earlier today in office, patient was so weak that she cannot be safely discharged home.  Dr. Raliegh Ip was trying to set up hospice or SNF placement for her but was not able to do so so he sent her to the ER.  Patient really does not have any infectious symptoms just reports generalized weakness.  Patient reports that her weakness started in September or October and has been getting gradually worse since.  Patient reports that she has been losing a lot of weight because she is so weak she cannot get out of bed to go feed herself.  She does report that her last normal meal was yesterday here at the hospital.  She lives at home alone with no home health.  Ports that she cannot walk anymore at all.  She does not think she has had any fevers, but she does admit to a recent dry cough.  She is vaccinated for COVID with her booster. patient reports that she is not on chemo and never has been, however according to oncology note she was on ipilimumab and nivolumab.  Patient is a former smoker she quit in September.  She does not drink.  She is DNR.  She is vaccinated for COVID with booster.  In the ED Temperature 96.8-99.6, heart rate 116, respiratory rate 22, blood pressure 173/93, satting at 95% Hematology shows a white blood cell count of 17.6 and a  hemoglobin of 7.2-patient recently discontinued chemo Chemistry shows a mild hypokalemia at 3.4, mildly elevated glucose 158 UA is not indicative of UTI urine culture pending Blood cultures pending Chest x-ray shows right pleural effusion and pulmonary metastatic disease versus pneumonia EKG with a heart rate of 122 sinus tach with a QTC of 463 Patient was given 500 cc of normal saline, 500 cc of lactated Ringer's, started on 150 mill mL per hour of LR Patient was started on cefepime Vanco and Flagyl in the ER for broad-spectrum coverage Admission requested primarily for placement, but also to further work-up possible infection and treat generalized weakness    Review of systems:    In addition to the HPI above,  No Fever-chills, No Headache, No changes with Vision or hearing, No problems swallowing food or Liquids, No Chest pain, but she does have chronic dyspnea and dry cough No Abdominal pain, No Nausea or Vomiting, bowel movements are regular, No Blood in stool or Urine, No dysuria, No new skin rashes or bruises,  No new joints pains-aches,  No new asymmetric weakness, tingling, numbness in any extremity, No recent weight gain admits to weight loss No polyuria, polydypsia or polyphagia, No significant Mental Stressors.  All other systems reviewed and are negative.    Past History of the following :    Past Medical History:  Diagnosis Date  . Allergic rhinitis   . Anxiety   . Cataract   . COPD (chronic obstructive pulmonary disease) ([place])   . Depression   . DeQuervain's disease (tenosynovitis)   . High cholesterol   . History of kidney cancer   . Hyperlipidemia   . Hypertension   . Irritable bowel syndrome with constipation   . Knee fracture   . Low back pain   . Migraines   . Osteoarthritis   . Peptic ulcer disease   . PVD (peripheral vascular disease) ([place])   . Right rotator cuff tear       Past Surgical History:  Procedure Laterality Date  . back tumor     . BIOPSY  08/31/2019   Procedure: BIOPSY;  Surgeon: Harvel Quale, MD;  [facility];  Service: Gastroenterology;;  . breast tumor    . CATARACT EXTRACTION W/PHACO Right 03/13/2012   Procedure: CATARACT EXTRACTION PHACO AND INTRAOCULAR LENS PLACEMENT (IOC);  Surgeon: Tonny Branch, MD;  Location: [facility];  Service: Ophthalmology;  Laterality: Right;  CDE:15.51  . CATARACT EXTRACTION W/PHACO Left 03/27/2012   Procedure: CATARACT EXTRACTION PHACO AND INTRAOCULAR LENS PLACEMENT (IOC);  Surgeon: Tonny Branch, MD;  Location: [facility];  Service: Ophthalmology;  Laterality: Left;  CDE=10.91  . cataract surgery    . COLONOSCOPY  12/08/2011   Procedure: COLONOSCOPY;  Surgeon: Daneil Dolin, MD;  [facility];  Service: Endoscopy;  Laterality: N/A;  11:15 AM  . COLONOSCOPY WITH PROPOFOL N/A 08/31/2019   Procedure: COLONOSCOPY WITH PROPOFOL;  Surgeon: Harvel Quale, MD;  [facility];  Service: Gastroenterology;  Laterality: N/A;  815  . CYST EXCISION Right July 2015   Right Preauricular area  . ESOPHAGOGASTRODUODENOSCOPY (EGD) WITH PROPOFOL N/A 08/31/2019   Procedure: ESOPHAGOGASTRODUODENOSCOPY (EGD) WITH PROPOFOL;  Surgeon: Harvel Quale, MD;  [facility];  Service: Gastroenterology;  Laterality: N/A;  . PR VEIN BYPASS GRAFT,AORTO-FEM-POP    . right ankle otif    . right femoral to above the knee popliteal bypass    . right femur fracture    . right inguinal herniorraphy        Social History:      Social History   Tobacco Use  . Smoking status: Former Smoker    Packs/day: 2.00    Years: 52.00    Pack years: 104.00    Types: Cigarettes    Quit date: 06/23/2019    Years since quitting: 0.6  . Smokeless tobacco: Never Used  . Tobacco comment: pt states she smokes about 3-4 cigarettes a day  Substance Use Topics  . Alcohol use: No    Comment: she quit 1 year ago       Family History :     Family History   Problem Relation Age of Onset  . Hypertension Mother   . Heart attack Mother   . Stroke Mother   . Colon cancer Neg Hx       Home Medications:   Prior to Admission medications   Medication Sig Start Date End Date Taking? Authorizing Provider  albuterol (VENTOLIN HFA) 108 (90 Base) MCG/ACT inhaler  Inhale into the lungs. 11/28/19   [provider]  amLODipine (NORVASC) 10 MG tablet Take 10 mg by mouth daily.    [provider]  cilostazol (PLETAL) 100 MG tablet Take 100 mg by mouth 2 (two) times daily.    [provider]  Ensure (ENSURE) Take 237 mLs by mouth 2 (two) times daily.    [provider]  fentaNYL (DURAGESIC) 25 MCG/HR Place 1 patch onto the skin every 3 (three) days. 12/27/19   Nicholas Lose, MD  fluticasone (FLONASE) 50 MCG/ACT nasal spray Place 1 spray into both nostrils daily. 12/09/19   [provider]  gabapentin (NEURONTIN) 100 MG capsule Take 200 mg by mouth at bedtime.    [provider]  Ipilimumab (YERVOY IV) Inject into the vein every 21 ( twenty-one) days. X 4 cycles then d/c 11/15/19   [provider]  losartan (COZAAR) 50 MG tablet Take 50 mg by mouth daily.    [provider]  magnesium oxide (MAG-OX) 400 (241.3 Mg) MG tablet Take 1 tablet (400 mg total) by mouth daily. 11/23/19   Jacquelin Hawking, NP  mirtazapine (REMERON) 15 MG tablet Take 15 mg by mouth at bedtime.    [provider]  Nivolumab (OPDIVO IV) Inject into the vein every 21 ( twenty-one) days. X 4 cycles, then q 28 days 11/15/19   [provider]  oxybutynin (DITROPAN-XL) 10 MG 24 hr tablet Take 10 mg by mouth at bedtime.    [provider]  oxyCODONE (OXY IR/ROXICODONE) 5 MG immediate release tablet Take 1-2 tablets (5-10 mg total) by mouth every 6 (six) hours as needed for severe pain. 12/06/19   Derek Jack, MD  risedronate (ACTONEL) 35 MG tablet Take 35 mg by mouth every Thursday. with  water on empty stomach, nothing by mouth or lie down for next 30 minutes.    [provider]  simvastatin (ZOCOR) 20 MG tablet Take 20 mg by mouth at bedtime.    [provider]     Allergies:     Allergies  Allergen Reactions  . Aspirin Other (See Comments)    stomach upset     Physical Exam:   Vitals  Blood pressure (!) 173/93, pulse (!) 116, temperature 99.6 F (37.6 C), temperature source Oral, resp. rate (!) 22, SpO2 95 %.  1.  General: Patient laying right lateral decubitus in bed no acute distress  2. Psychiatric: Mood and behavior normal for situation, pleasant, cooperative with exam  3. Neurologic: Face is symmetric, 3 out of 5 strength in her legs 4 out of 5 strength in her upper extremities, speech and language are normal  4. HEENMT:  Head is atraumatic, normocephalic, pupils are reactive to light, neck is supple, trachea is midline  5. Respiratory : Lungs are without wheezing or crackles, no cyanosis  6. Cardiovascular : Heart rate is tachycardic, rhythm is regular, no murmurs rubs or gallops  7. Gastrointestinal:  Abdomen is soft, nondistended, nontender to palpation  8. Skin:  Skin is dry, skin breakdown over sacrum with bandage over it, no rashes or bruising  9.Musculoskeletal:  Atrophic legs, no acute deformities    Data Review:    CBC Recent Labs  Lab 01/29/20 0940 01/29/20 1946  WBC 17.1* 17.6*  HGB 8.6* 7.2*  HCT 30.8* 25.7*  PLT 722* 440*  MCV 77.4* 78.4*  MCH 21.6* 22.0*  MCHC 27.9* 28.0*  RDW 19.3* 19.2*  LYMPHSABS 1.7 1.7  MONOABS 0.7 1.2*  EOSABS 0.0 0.0  BASOSABS 0.0 0.0   ------------------------------------------------------------------------------------------------------------------  Results for orders placed or performed during the hospital encounter of 01/29/20 (from the past 48 hour(s))  Basic metabolic panel     Status: Abnormal   Collection Time: 01/29/20  4:52 PM  Result Value Ref Range    Sodium 136 135 - 145 mmol/L   Potassium 3.4 (L) 3.5 - 5.1 mmol/L   Chloride 100 98 - 111 mmol/L   CO2 22 22 - 32 mmol/L   Glucose, Bld 158 (H) 70 - 99 mg/dL    Comment: Glucose reference range applies only to samples taken after fasting for at least 8 hours.   BUN 20 8 - 23 mg/dL   Creatinine, Ser 0.63 0.44 - 1.00 mg/dL   Calcium 8.2 (L) 8.9 - 10.3 mg/dL   GFR, Estimated >60 >60 mL/min    Comment: (NOTE) Calculated using the CKD-EPI Creatinine Equation (2021)    Anion gap 14 5 - 15    Comment: Performed at [facility], [address], [place], [place] [address]  Culture, blood (routine x 2)     Status: None (Preliminary result)   Collection Time: 01/29/20  5:32 PM   Specimen: BLOOD RIGHT ARM  Result Value Ref Range   Specimen Description BLOOD RIGHT ARM    Special Requests      BOTTLES DRAWN AEROBIC AND ANAEROBIC Blood Culture adequate volume Performed at [facility], [address] [place], [place] [address]    Culture PENDING    Report Status PENDING   Lactic acid, plasma     Status: Abnormal   Collection Time: 01/29/20  5:32 PM  Result Value Ref Range   Lactic Acid, Venous 5.3 (HH) 0.5 - 1.9 mmol/L    Comment: CRITICAL RESULT CALLED TO, READ BACK BY AND VERIFIED WITH: BRAME,M ON 01/29/20 AT 1910 BY LOY,C Performed at [facility], [address] [place], [place] [address]   CBC with Differential/Platelet     Status: Abnormal   Collection Time: 01/29/20  7:46 PM  Result Value Ref Range   WBC 17.6 (H) 4.0 - 10.5 K/uL   RBC 3.28 (L) 3.87 - 5.11 MIL/uL   Hemoglobin 7.2 (L) 12.0 - 15.0 g/dL   HCT 25.7 (L) 36.0 - 46.0 %   MCV 78.4 (L) 80.0 - 100.0 fL   MCH 22.0 (L) 26.0 - 34.0 pg   MCHC 28.0 (L) 30.0 - 36.0 g/dL   RDW 19.2 (H) 11.5 - 15.5 %   Platelets 440 (H) 150 - 400 K/uL   nRBC 0.2 0.0 - 0.2 %   Neutrophils Relative % 82 %   Neutro Abs 14.5 (H) 1.7 - 7.7 K/uL   Lymphocytes Relative 10 %   Lymphs Abs 1.7 0.7 - 4.0 K/uL   Monocytes Relative 7 %   Monocytes Absolute  1.2 (H) 0.1 - 1.0 K/uL   Eosinophils Relative 0 %   Eosinophils Absolute 0.0 0.0 - 0.5 K/uL   Basophils Relative 0 %   Basophils Absolute 0.0 0.0 - 0.1 K/uL   Immature Granulocytes 1 %   Abs Immature Granulocytes 0.16 (H) 0.00 - 0.07 K/uL    Comment: Performed at [facility], [address] [place],  [address]  Urinalysis, Routine w reflex microscopic Urine, Clean Catch     Status: Abnormal   Collection Time: 01/29/20  7:50 PM  Result Value Ref Range   Color, Urine YELLOW YELLOW   APPearance HAZY (A) CLEAR   Specific Gravity, Urine  1.016 1.005 - 1.030   pH 5.0 5.0 - 8.0   Glucose, UA NEGATIVE NEGATIVE mg/dL   Hgb urine dipstick NEGATIVE NEGATIVE   Bilirubin Urine NEGATIVE NEGATIVE   Ketones, ur NEGATIVE NEGATIVE mg/dL   Protein, ur 30 (A) NEGATIVE mg/dL   Nitrite NEGATIVE NEGATIVE   Leukocytes,Ua NEGATIVE NEGATIVE   RBC / HPF 11-20 0 - 5 RBC/hpf   WBC, UA 0-5 0 - 5 WBC/hpf   Bacteria, UA NONE SEEN NONE SEEN   Squamous Epithelial / LPF 0-5 0 - 5   Mucus PRESENT     Comment: Performed at [facility], [address] [place], [place] [address]    Chemistries  Recent Labs  Lab 01/29/20 0940 01/29/20 1652  NA 137 136  K 3.9 3.4*  CL 97* 100  CO2 23 22  GLUCOSE 97 158*  BUN 17 20  CREATININE 0.57 0.63  CALCIUM 8.9 8.2*  MG 1.6*  --   AST 20  --   ALT 12  --   ALKPHOS 155*  --   BILITOT 0.4  --    ------------------------------------------------------------------------------------------------------------------  ------------------------------------------------------------------------------------------------------------------ GFR: CrCl cannot be calculated (Unknown ideal weight.). Liver Function Tests: Recent Labs  Lab 01/29/20 0940  AST 20  ALT 12  ALKPHOS 155*  BILITOT 0.4  PROT 7.5  ALBUMIN 2.5*   No results for input(s): LIPASE, AMYLASE in the last 168 hours. No results for input(s): AMMONIA in the last 168 hours. Coagulation Profile: No results  for input(s): INR, PROTIME in the last 168 hours. Cardiac Enzymes: No results for input(s): CKTOTAL, CKMB, CKMBINDEX, TROPONINI in the last 168 hours. BNP (last 3 results) No results for input(s): PROBNP in the last 8760 hours. HbA1C: No results for input(s): HGBA1C in the last 72 hours. CBG: No results for input(s): GLUCAP in the last 168 hours. Lipid Profile: No results for input(s): CHOL, HDL, LDLCALC, TRIG, CHOLHDL, LDLDIRECT in the last 72 hours. Thyroid Function Tests: Recent Labs    01/29/20 0940  TSH 1.393   Anemia Panel: No results for input(s): VITAMINB12, FOLATE, FERRITIN, TIBC, IRON, RETICCTPCT in the last 72 hours.  --------------------------------------------------------------------------------------------------------------- Urine analysis:    Component Value Date/Time   COLORURINE YELLOW 01/29/2020 1950   APPEARANCEUR HAZY (A) 01/29/2020 1950   APPEARANCEUR Hazy (A) 10/23/2019 1453   LABSPEC 1.016 01/29/2020 1950   PHURINE 5.0 01/29/2020 1950   GLUCOSEU NEGATIVE 01/29/2020 1950   HGBUR NEGATIVE 01/29/2020 1950   HGBUR small 12/05/2006 1330   BILIRUBINUR NEGATIVE 01/29/2020 1950   BILIRUBINUR Negative 10/23/2019 [place] 01/29/2020 1950   PROTEINUR 30 (A) 01/29/2020 1950   UROBILINOGEN 0.2 07/23/2010 2045   NITRITE NEGATIVE 01/29/2020 1950   LEUKOCYTESUR NEGATIVE 01/29/2020 1950      Imaging Results:    DG Chest Port 1 View  Result Date: 01/29/2020 CLINICAL DATA:  Weakness EXAM: PORTABLE CHEST 1 VIEW COMPARISON:  10/11/2019 radiograph and chest CT, PET CT 10/01/2019 FINDINGS: Possible tiny right pleural effusion. Increased bibasilar opacity. Nodular appearance of pulmonary parenchyma. Stable cardiomediastinal silhouette with aortic atherosclerosis. No pneumothorax. Chronic appearing rib fractures. Stable cystic lucency at the right glenoid. Known osseous metastatic disease better seen on CT. IMPRESSION: Possible tiny right pleural effusion.  Increased bibasilar opacities, either reflecting increased pulmonary metastatic disease versus mild pneumonia. Diffuse nodular appearance of pulmonary parenchyma, felt to correspond to CT demonstrated metastatic lung nodules. Electronically Signed   By: Donavan Foil M.D.   On: 01/29/2020 17:57    My personal  review of EKG: Rhythm ST, Rate 122 /min, QTc463,no Acute ST changes   Assessment & Plan:    Active Problems:   Essential hypertension   Sepsis ([place])   Hypokalemia   Protein-calorie malnutrition, moderate ([facility])   Lactic acidosis   1. Generalized weakness 1. At this point the working diagnosis of sepsis due to a heart rate of 114, respiratory rate of 22, white blood cell count 17.6, possible pneumonia on chest x-ray 2. Patient was given a total of 1 L bolus in the ER and started on cefepime Vanco and Flagyl 3. Blood cultures pending 4. UA shows 0-5 white blood cells no bacteria negative leukocytes-not indicative of UTI, urine culture pending 5. Ultimately patient is going to need placement secondary to generalized weakness and Dr. Raliegh Ip (oncology) was trying to get this set up today as patient is no longer able to take care of her self at home 6. Palliative care consulted- patient will need an in-depth goals of care discussion as she still would like to be full code 7. PT for placement recommendations 8. TSH pending 9. Continue to monitor 2. Possible sepsis secondary to pneumonia 1. Current working diagnosis, see plan above 3. Lactic acidosis 1. Initial lactic acid 5.31 L bolus in ED followed by 150 mL/h maintenance 2. Recheck lactic 4. Hypertension 1. BP 173/93 in ED 2. Continue amlodipine, losartan 3. Continue to monitor 5. Hypokalemia 1. Minimally low at 3.4 2. 20 mEq packet 6. Protein calorie malnutrition 1. Albumin is 2.5 2. Continue Ensure feeding supplement 3. Continue mirtazapine for appetite stimulation 4. Continue to monitor 7. Dispo: likely to discharge to SNF in  48-72 hours   DVT Prophylaxis-   Heparin- SCDs AM Labs Ordered, also please review Full Orders  Family Communication: No family at bedside Code Status: Full  Admission status:Inpatient :The appropriate admission status for this patient is INPATIENT. Inpatient status is judged to be reasonable and necessary in order to provide the required intensity of service to ensure the patient's safety. The patient's presenting symptoms, physical exam findings, and initial radiographic and laboratory data in the context of their chronic comorbidities is felt to place them at high risk for further clinical deterioration. Furthermore, it is not anticipated that the patient will be medically stable for discharge from the hospital within 2 midnights of admission. The following factors support the admission status of inpatient.     The patient's presenting symptoms include generalized weakness The worrisome physical exam findings include tachycardia with heart rate in the 110s. The initial radiographic and laboratory data are worrisome because of possible pneumonia on chest x-ray lactic acidosis of greater than 5, leukocytosis greater than 17,000 The chronic co-morbidities include peripheral vascular disease, hypertension, hyperlipidemia, metastatic renal cancer, hyperlipidemia, COPD       * I certify that at the point of admission it is my clinical judgment that the patient will require inpatient hospital care spanning beyond 2 midnights from the point of admission due to high intensity of service, high risk for further deterioration and high frequency of surveillance required.*  Time spent in minutes : [place]

## 2020-01-29 NOTE — Progress Notes (Signed)
[facility] CSW Progress Notes  Request from nursing staff to attempt to find placement for patient who is severely weak and deconditioned.  Unable to receive treatment today due to above.  Lives alone, no family.  Has friends/former employer who have been assisting her.  Palliative care social worker and NP have seen her in the home - no viable solutions generated by palliative care.  CSW attempted to find placement in local areas. [facility] is not taking admissions, left VM for [place], [place] and [facility] admissions.  Awaiting return calls.  [facility] has patient documents and is reviewing for possible admission.  Will update treatment team as new information is received.  Edwyna Shell, LCSW Clinical Social Worker Phone:  [phone]

## 2020-01-29 NOTE — Progress Notes (Signed)
No treatment today, trying to get patient placed in the nursing home.  1630-Pt taken to ER via stretcher.

## 2020-01-29 NOTE — Progress Notes (Signed)
[place], [place] [address]   CLINIC:  Medical Oncology/Hematology  PCP:  Rosita Fire, MD [place] / [place] [id number] [phone]   REASON FOR VISIT:  Follow-up for metastatic right renal cell carcinoma, liver nodules and lung nodules  PRIOR THERAPY: Ipilimumab and nivolumab x 3 cycles from 11/15/2019 to 12/27/2019  NGS Results: Not done  CURRENT THERAPY: Hospice  BRIEF ONCOLOGIC HISTORY:  Oncology History  Primary malignant neoplasm of kidney with metastasis from kidney to other site [facility])  11/12/2019 Initial Diagnosis   Primary malignant neoplasm of kidney with metastasis from kidney to other site [facility])   11/15/2019 -  Chemotherapy   The patient had ipilimumab (YERVOY) 35 mg in sodium chloride 0.9 % 50 mL chemo infusion, 1 mg/kg = 35 mg, Intravenous,  Once, 3 of 4 cycles Administration: 35 mg (11/15/2019), 35 mg (12/27/2019), 35 mg (12/06/2019) nivolumab (OPDIVO) 100 mg in sodium chloride 0.9 % 50 mL chemo infusion, 3.08 mg/kg = 98 mg, Intravenous, Once, 3 of 9 cycles Administration: 100 mg (11/15/2019), 100 mg (12/27/2019), 100 mg (12/06/2019)  for chemotherapy treatment.      CANCER STAGING: Cancer Staging No matching staging information was found for the patient.  INTERVAL HISTORY:  Alicia [name], a 80 y.o. female, returns for routine follow-up and consideration for next cycle of chemotherapy. Alicia [name] was last seen by Dr. Nicholas Lose on 12/27/2019.  Due for cycle #4 of ipilimumab and nivolumab today.   Overall, she tells me she has been feeling very poorly. She reports being very weak and unable to move around or feed herself; she is bedbound. She has a bedside commode at home and needs help to use it. She has incontinence since she is not able to control her bowels. Her appetite is decreased and is intermittent; when she does have an appetite, she is too weak to prep her own food. She reports that her  tongue has become swollen and interferes with her drinking, so she needs to use a straw. Her mouth has also been numb for the past 1 month.   She does not want to stay at home since there is nobody there to take care of her.   She will stop treatments and wants to go to hospice facility.   REVIEW OF SYSTEMS:  Review of Systems  Constitutional: Positive for appetite change (intermittent), fatigue (depleted) and unexpected weight change (keeps losing weight).  Genitourinary: Positive for bladder incontinence.   Neurological: Positive for numbness (entire mouth for last 1 month).    PAST MEDICAL/SURGICAL HISTORY:  Past Medical History:  Diagnosis Date  . Allergic rhinitis   . Anxiety   . Cataract   . COPD (chronic obstructive pulmonary disease) ([place])   . Depression   . DeQuervain's disease (tenosynovitis)   . High cholesterol   . History of kidney cancer   . Hyperlipidemia   . Hypertension   . Irritable bowel syndrome with constipation   . Knee fracture   . Low back pain   . Migraines   . Osteoarthritis   . Peptic ulcer disease   . PVD (peripheral vascular disease) ([place])   . Right rotator cuff tear    Past Surgical History:  Procedure Laterality Date  . back tumor    . BIOPSY  08/31/2019   Procedure: BIOPSY;  Surgeon: Harvel Quale, MD;  [facility];  Service: Gastroenterology;;  . breast tumor    .  CATARACT EXTRACTION W/PHACO Right 03/13/2012   Procedure: CATARACT EXTRACTION PHACO AND INTRAOCULAR LENS PLACEMENT (IOC);  Surgeon: Tonny Branch, MD;  Location: [facility];  Service: Ophthalmology;  Laterality: Right;  CDE:15.51  . CATARACT EXTRACTION W/PHACO Left 03/27/2012   Procedure: CATARACT EXTRACTION PHACO AND INTRAOCULAR LENS PLACEMENT (IOC);  Surgeon: Tonny Branch, MD;  Location: [facility];  Service: Ophthalmology;  Laterality: Left;  CDE=10.91  . cataract surgery    . COLONOSCOPY  12/08/2011   Procedure: COLONOSCOPY;  Surgeon: Daneil Dolin, MD;  Location[facility]  [facility];  Service: Endoscopy;  Laterality: N/A;  11:15 AM  . COLONOSCOPY WITH PROPOFOL N/A 08/31/2019   Procedure: COLONOSCOPY WITH PROPOFOL;  Surgeon: Harvel Quale, MD;  Location[facility];  Service: Gastroenterology;  Laterality: N/A;  815  . CYST EXCISION Right July 2015   Right Preauricular area  . ESOPHAGOGASTRODUODENOSCOPY (EGD) WITH PROPOFOL N/A 08/31/2019   Procedure: ESOPHAGOGASTRODUODENOSCOPY (EGD) WITH PROPOFOL;  Surgeon: Harvel Quale, MD;  Location[facility];  Service: Gastroenterology;  Laterality: N/A;  . PR VEIN BYPASS GRAFT,AORTO-FEM-POP    . right ankle otif    . right femoral to above the knee popliteal bypass    . right femur fracture    . right inguinal herniorraphy      SOCIAL HISTORY:  Social History   Socioeconomic History  . Marital status: Single    Spouse name: Not on file  . Number of children: 0  . Years of education: Not on file  . Highest education level: Not on file  Occupational History  . Not on file  Tobacco Use  . Smoking status: Former Smoker    Packs/day: 2.00    Years: 52.00    Pack years: 104.00    Types: Cigarettes    Quit date: 06/23/2019    Years since quitting: 0.6  . Smokeless tobacco: Never Used  . Tobacco comment: pt states she smokes about 3-4 cigarettes a day  Vaping Use  . Vaping Use: Not on file  Substance and Sexual Activity  . Alcohol use: No    Comment: she quit 1 year ago  . Drug use: No  . Sexual activity: Not on file  Other Topics Concern  . Not on file  Social History Narrative  . Not on file   Social Determinants of Health   Financial Resource Strain: Low Risk   . Difficulty of Paying Living Expenses: Not very hard  Food Insecurity: No Food Insecurity  . Worried About Charity fundraiser in the Last Year: Never true  . Ran Out of Food in the Last Year: Never true  Transportation Needs: No Transportation Needs  . Lack of Transportation (Medical): No  . Lack of  Transportation (Non-Medical): No  Physical Activity: Inactive  . Days of Exercise per Week: 0 days  . Minutes of Exercise per Session: 0 min  Stress: Stress Concern Present  . Feeling of Stress : To some extent  Social Connections: Moderately Isolated  . Frequency of Communication with Friends and Family: Three times a week  . Frequency of Social Gatherings with Friends and Family: Three times a week  . Attends Religious Services: More than 4 times per year  . Active Member of Clubs or Organizations: No  . Attends Archivist Meetings: Never  . Marital Status: Never married  Intimate Partner Violence: Not At Risk  . Fear of Current or Ex-Partner: No  . Emotionally Abused: No  . Physically Abused:  No  . Sexually Abused: No    FAMILY HISTORY:  Family History  Problem Relation Age of Onset  . Hypertension Mother   . Heart attack Mother   . Stroke Mother   . Colon cancer Neg Hx     CURRENT MEDICATIONS:  Current Outpatient Medications  Medication Sig Dispense Refill  . albuterol (VENTOLIN HFA) 108 (90 Base) MCG/ACT inhaler Inhale into the lungs.    Marland Kitchen amLODipine (NORVASC) 10 MG tablet Take 10 mg by mouth daily.    . cilostazol (PLETAL) 100 MG tablet Take 100 mg by mouth 2 (two) times daily.    . Ensure (ENSURE) Take 237 mLs by mouth 2 (two) times daily.    . fentaNYL (DURAGESIC) 25 MCG/HR Place 1 patch onto the skin every 3 (three) days. 10 patch 0  . fluticasone (FLONASE) 50 MCG/ACT nasal spray Place 1 spray into both nostrils daily.    Marland Kitchen gabapentin (NEURONTIN) 100 MG capsule Take 200 mg by mouth at bedtime.    Marland Kitchen Ipilimumab (YERVOY IV) Inject into the vein every 21 ( twenty-one) days. X 4 cycles then d/c    . losartan (COZAAR) 50 MG tablet Take 50 mg by mouth daily.    . magnesium oxide (MAG-OX) 400 (241.3 Mg) MG tablet Take 1 tablet (400 mg total) by mouth daily. 10 tablet 0  . mirtazapine (REMERON) 15 MG tablet Take 15 mg by mouth at bedtime.    . Nivolumab (OPDIVO  IV) Inject into the vein every 21 ( twenty-one) days. X 4 cycles, then q 28 days    . oxybutynin (DITROPAN-XL) 10 MG 24 hr tablet Take 10 mg by mouth at bedtime.    Marland Kitchen oxyCODONE (OXY IR/ROXICODONE) 5 MG immediate release tablet Take 1-2 tablets (5-10 mg total) by mouth every 6 (six) hours as needed for severe pain. 120 tablet 0  . risedronate (ACTONEL) 35 MG tablet Take 35 mg by mouth every Thursday. with water on empty stomach, nothing by mouth or lie down for next 30 minutes.    . simvastatin (ZOCOR) 20 MG tablet Take 20 mg by mouth at bedtime.     No current facility-administered medications for this visit.   Facility-Administered Medications Ordered in Other Visits  Medication Dose Route Frequency Provider Last Rate Last Admin  . fentaNYL (SUBLIMAZE) injection 25-50 mcg  25-50 mcg Intravenous Q5 min PRN Lerry Liner, MD        ALLERGIES:  Allergies  Allergen Reactions  . Aspirin Other (See Comments)    stomach upset    PHYSICAL EXAM:  Performance status (ECOG): 4 - Bedbound  Vitals:   01/29/20 0936  BP: (!) 156/96  Pulse: (!) 128  Resp: 16  Temp: (!) 96.8 F (36 C)  SpO2: 96%   Wt Readings from Last 3 Encounters:  12/27/19 67 lb 12.8 oz (30.8 kg)  11/23/19 79 lb 9.6 oz (36.1 kg)  11/15/19 73 lb 14.4 oz (33.5 kg)   Physical Exam Abdominal:     Palpations: Abdomen is soft. There is mass.    Musculoskeletal:     Right lower leg: No edema.     Left lower leg: No edema.     LABORATORY DATA:  I have reviewed the labs as listed.  CBC Latest Ref Rng & Units 01/29/2020 12/27/2019 12/06/2019  WBC 4.0 - 10.5 K/uL 17.1(H) 12.2(H) 10.3  Hemoglobin 12.0 - 15.0 g/dL 8.6(L) 8.1(L) 10.3(L)  Hematocrit 36.0 - 46.0 % 30.8(L) 27.7(L) 35.3(L)  Platelets 150 - 400 K/uL  722(H) 497(H) 498(H)   CMP Latest Ref Rng & Units 01/29/2020 12/27/2019 12/06/2019  Glucose 70 - 99 mg/dL 97 88 168(H)  BUN 8 - 23 mg/dL 17 16 15   Creatinine 0.44 - 1.00 mg/dL 0.57 0.53 0.68  Sodium 135 - 145  mmol/L 137 137 135  Potassium 3.5 - 5.1 mmol/L 3.9 4.3 3.4(L)  Chloride 98 - 111 mmol/L 97(L) 98 95(L)  CO2 22 - 32 mmol/L 23 25 26   Calcium 8.9 - 10.3 mg/dL 8.9 9.1 8.9  Total Protein 6.5 - 8.1 g/dL 7.5 6.8 7.2  Total Bilirubin 0.3 - 1.2 mg/dL 0.4 0.6 0.6  Alkaline Phos 38 - 126 U/L 155(H) 140(H) 158(H)  AST 15 - 41 U/L 20 13(L) 13(L)  ALT 0 - 44 U/L 12 10 11    Lab Results  Component Value Date   LDH 1,033 (H) 01/29/2020   LDH 671 (H) 12/27/2019   LDH 667 (H) 12/06/2019    DIAGNOSTIC IMAGING:  I have independently reviewed the scans and discussed with the patient. No results found.   ASSESSMENT:  1.Metastatic renal cell carcinoma to the lymph nodes, liver and bones: -Evaluation for weight loss with CT CAP on 09/04/2019 showed 5 cm right kidney mass, pathologically enlarged prevascular lymph nodes in the chest, enlarged left axillary lymph node, central mesenteric lymph node, adenopathy surrounding the abdominal aorta and numerous small bilateral pulmonary nodules. -MRI of the brain on 10/17/2019 with no metastatic disease. -LDH was elevated at 314. SPEP was negative. -PET scan on 10/01/2019 shows intense FDG uptake in the inferior pole of the right kidney, bulky retroperitoneal adenopathy, left cervical lymph node, left axilla and left retropectoral region, innumerable bone lesions, solitary FDG avid right hepatic lobe liver metastasis, small pulmonary nodules. -IMDC poor prognostic risk group, 4 points -Combination ipilimumab and nivolumab started on 11/07/2019.  2. Weight loss: -30 to 40 pound weight loss since January of this year. Initially had vomiting which subsided at this time. She does report occasional night sweats. -Colonoscopy on 08/31/2019 shows diverticulosis in the sigmoid colon, descending colon and and ascending colon. 2 nonbleeding colonic angiodysplastic lesions. One 9millimeter polyp in the sigmoid colon. -EGD on 08/31/2019 shows 3 cm hiatal hernia, normal  stomach, duodenum.  3. Social/family history: -Quit smoking 2 and half months ago. She lives alone with her dog. -Smoked 1 to 2 packs/day for 60 years. -No family history of malignancies.   PLAN:  1.Metastatic renal cell carcinoma: -She has completed 3 cycles of combination immunotherapy. - She did not have any major immunotherapy related side effects. - However she has been progressively getting weaker.  Her family friend told us that she is not able to even go to the bathroom by herself. - We have talked about discontinuing treatment at this time. - We'll make a referral to hospice.  She will benefit from inpatient hospice as she is not able to function by herself.  She will be referred to inpatient hospice facility.  2. Weight loss: -She was losing weight.  Today we could not weigh her.  3.Diffuse body pains: -Continue oxycodone as needed.  4. Microcytic anemia: -This is from anemia from chronic inflammation from malignancy. - Hemoglobin today is 8.6.    Orders placed this encounter:  No orders of the defined types were placed in this encounter.    Derek Jack, MD [place] [phone]   I, Milinda Antis, am acting as a scribe for Dr. Sanda Linger.  Kinnie Scales MD, have reviewed  the above documentation for accuracy and completeness, and I agree with the above.

## 2020-01-29 NOTE — Sepsis Progress Note (Signed)
Notified MD of need for repeat lactic acid. Order already placed and held while waiting for bed.   Notified bedside Rn of need to release order and draw repeat lactic acid.

## 2020-01-29 NOTE — Clinical Social Work Note (Signed)
CSW updated that pt was sent to [facility]. Pt to d/c to [facility] for SNF earlier today but was unable due to bed bugs being found on pt. CSW informed that pt was stopping treatment at the cancer center and wanted to go to residential hospice. CSW informed that [facility] may have a bed for pt in [place] if she met requirements. CSW spoke with Bedelia Person [phone] or her direct line [phone]. Sheree states that someone needs to speak with pt about Code status, comfort care, and she needs to know if pts life expectancy is less than 2 weeks. CSW updated Dr. Melina Copa, Dr. Melina Copa states that pt stated she would like intubation and CPR and that he was unsure of life expectancy but it seemed like more than 2 weeks. CSW informed Sheree with Hospice, she asks that Palliative be consulted to speak with pt to have goals of care conversation. CSW updated Dr. Melina Copa, who has placed a Palliative consult. Sheree states she will be working tomorrow and to provide her with updates. TOC to follow.

## 2020-01-29 NOTE — ED Notes (Addendum)
CRITICAL VALUE ALERT  Date and time results received: 01/29/20 7:15 PM  Test: Lactic Acid Critical Value: 5.3  Name of Provider Notified: Dr. Melina Copa  Orders Received? Or Actions Taken?: N/A

## 2020-01-29 NOTE — ED Provider Notes (Signed)
[facility] EMERGENCY DEPARTMENT Provider Note   CSN: [id number] Arrival date & time: 01/29/20  1632     History Chief Complaint  Patient presents with  . SNF Placement    Alicia [name] Bilbrey is a 80 y.o. female.  She has a history of renal cell cancer and was at the cancer center today.  She was brought down to the ED because she wishes to go inpatient hospice.  She is too weak to care for herself at home and has no assistance there.  She said she cannot get up from the bed and defecates herself.  Losing weight.  Starting to get a bedsore.  Per note from oncology they are discontinuing chemotherapy.  Reportedly social work tried to get her into hospice but there were no beds available.  She was unable to be placed in a SNF due to possible bedbugs.  She said she had a syncopal event last week and she has had falls due to her generalized weakness.  The history is provided by the patient.  Weakness Severity:  Severe Onset quality:  Gradual Timing:  Constant Progression:  Worsening Chronicity:  New Relieved by:  Nothing Worsened by:  Nothing Ineffective treatments:  None tried Associated symptoms: difficulty walking, falls and loss of consciousness   Associated symptoms: no abdominal pain, no chest pain, no cough, no dysuria, no fever and no shortness of breath        Past Medical History:  Diagnosis Date  . Allergic rhinitis   . Anxiety   . Cataract   . COPD (chronic obstructive pulmonary disease) (HCC)   . Depression   . DeQuervain's disease (tenosynovitis)   . High cholesterol   . History of kidney cancer   . Hyperlipidemia   . Hypertension   . Irritable bowel syndrome with constipation   . Knee fracture   . Low back pain   . Migraines   . Osteoarthritis   . Peptic ulcer disease   . PVD (peripheral vascular disease) (HCC)   . Right rotator cuff tear     Patient Active Problem List   Diagnosis Date Noted  . Kidney cancer, primary, with metastasis from kidney to other  site [facility]) 11/12/2019  . Goals of care, counseling/discussion 11/12/2019  . Primary malignant neoplasm of kidney with metastasis from kidney to other site [facility]) 11/12/2019  . Renal mass 09/10/2019  . Lung nodules 09/10/2019  . Loss of weight 08/09/2019  . Early satiety 08/09/2019  . Lymphadenopathy, axillary 08/09/2019  . Fever, unspecified 07/26/2019  . Sensation of feeling cold- Bilat foot 12/03/2013  . Atherosclerosis of native arteries of the extremities with intermittent claudication 01/25/2011  . Shoulder pain, bilateral 01/04/2011  . LIPOMA 01/06/2010  . UNSPECIFIED CYST OF BONE 12/25/2009  . CONTRACTURE OF ANKLE AND FOOT JOINT 08/07/2009  . DISRUPTION OF INTERNAL OPERATION SURGICAL WOUND 05/22/2009  . CLOSED BIMALLEOLAR FRACTURE 02/12/2009  . RUPTURE ROTATOR CUFF 10/03/2008  . TOBACCO ABUSE 04/09/2008  . Pain in joint, shoulder region 04/09/2008  . CLAUDICATION, INTERMITTENT 07/11/2007  . FOOT PAIN, BILATERAL 07/11/2007  . OSTEOARTHRITIS 01/02/2007  . Nonspecific (abnormal) findings on radiological and other examination of body structure 08/03/2006  . ABNORMAL CHEST XRAY 08/03/2006  . HYPERLIPIDEMIA 06/07/2006  . ANXIETY 06/07/2006  . DEPRESSION 06/07/2006  . HYPERTENSION 06/07/2006  . ALLERGIC RHINITIS 06/07/2006  . PEPTIC ULCER DISEASE 06/07/2006  . LOW BACK PAIN 06/07/2006    Past Surgical History:  Procedure Laterality Date  . back tumor    .  BIOPSY  08/31/2019   Procedure: BIOPSY;  Surgeon: Dolores Frame, MD;  Location: [facility] ENDO SUITE;  Service: Gastroenterology;;  . breast tumor    . CATARACT EXTRACTION W/PHACO Right 03/13/2012   Procedure: CATARACT EXTRACTION PHACO AND INTRAOCULAR LENS PLACEMENT (IOC);  Surgeon: Gemma Payor, MD;  Location: [facility] ORS;  Service: Ophthalmology;  Laterality: Right;  CDE:15.51  . CATARACT EXTRACTION W/PHACO Left 03/27/2012   Procedure: CATARACT EXTRACTION PHACO AND INTRAOCULAR LENS PLACEMENT (IOC);  Surgeon: Gemma Payor, MD;   Location: [facility] ORS;  Service: Ophthalmology;  Laterality: Left;  CDE=10.91  . cataract surgery    . COLONOSCOPY  12/08/2011   Procedure: COLONOSCOPY;  Surgeon: Corbin Ade, MD;  Location: [facility] ENDO SUITE;  Service: Endoscopy;  Laterality: N/A;  11:15 AM  . COLONOSCOPY WITH PROPOFOL N/A 08/31/2019   Procedure: COLONOSCOPY WITH PROPOFOL;  Surgeon: Dolores Frame, MD;  Location: [facility];  Service: Gastroenterology;  Laterality: N/A;  815  . CYST EXCISION Right July 2015   Right Preauricular area  . ESOPHAGOGASTRODUODENOSCOPY (EGD) WITH PROPOFOL N/A 08/31/2019   Procedure: ESOPHAGOGASTRODUODENOSCOPY (EGD) WITH PROPOFOL;  Surgeon: Dolores Frame, MD;  Location: [facility];  Service: Gastroenterology;  Laterality: N/A;  . PR VEIN BYPASS GRAFT,AORTO-FEM-POP    . right ankle otif    . right femoral to above the knee popliteal bypass    . right femur fracture    . right inguinal herniorraphy       OB History   No obstetric history on file.     Family History  Problem Relation Age of Onset  . Hypertension Mother   . Heart attack Mother   . Stroke Mother   . Colon cancer Neg Hx     Social History   Tobacco Use  . Smoking status: Former Smoker    Packs/day: 2.00    Years: 52.00    Pack years: 104.00    Types: Cigarettes    Quit date: 06/23/2019    Years since quitting: 0.6  . Smokeless tobacco: Never Used  . Tobacco comment: pt states she smokes about 3-4 cigarettes a day  Substance Use Topics  . Alcohol use: No    Comment: she quit 1 year ago  . Drug use: No    Home Medications Prior to Admission medications   Medication Sig Start Date End Date Taking? Authorizing Provider  albuterol (VENTOLIN HFA) 108 (90 Base) MCG/ACT inhaler Inhale into the lungs. 11/28/19   [provider]  amLODipine (NORVASC) 10 MG tablet Take 10 mg by mouth daily.    [provider]  cilostazol (PLETAL) 100 MG tablet Take 100 mg by mouth 2 (two) times daily.     [provider]  Ensure (ENSURE) Take 237 mLs by mouth 2 (two) times daily.    [provider]  fentaNYL (DURAGESIC) 25 MCG/HR Place 1 patch onto the skin every 3 (three) days. 12/27/19   Serena Croissant, MD  fluticasone (FLONASE) 50 MCG/ACT nasal spray Place 1 spray into both nostrils daily. 12/09/19   [provider]  gabapentin (NEURONTIN) 100 MG capsule Take 200 mg by mouth at bedtime.    [provider]  Ipilimumab (YERVOY IV) Inject into the vein every 21 ( twenty-one) days. X 4 cycles then d/c 11/15/19   [provider]  losartan (COZAAR) 50 MG tablet Take 50 mg by mouth daily.    [provider]  magnesium oxide (MAG-OX) 400 (241.3 Mg) MG tablet Take 1 tablet (  400 mg total) by mouth daily. 11/23/19   Mauro Kaufmann, NP  mirtazapine (REMERON) 15 MG tablet Take 15 mg by mouth at bedtime.    [provider]  Nivolumab (OPDIVO IV) Inject into the vein every 21 ( twenty-one) days. X 4 cycles, then q 28 days 11/15/19   [provider]  oxybutynin (DITROPAN-XL) 10 MG 24 hr tablet Take 10 mg by mouth at bedtime.    [provider]  oxyCODONE (OXY IR/ROXICODONE) 5 MG immediate release tablet Take 1-2 tablets (5-10 mg total) by mouth every 6 (six) hours as needed for severe pain. 12/06/19   Doreatha Massed, MD  risedronate (ACTONEL) 35 MG tablet Take 35 mg by mouth every Thursday. with water on empty stomach, nothing by mouth or lie down for next 30 minutes.    [provider]  simvastatin (ZOCOR) 20 MG tablet Take 20 mg by mouth at bedtime.    [provider]    Allergies    Aspirin  Review of Systems   Review of Systems  Constitutional: Negative for fever.  HENT: Negative for sore throat.   Eyes: Negative for visual disturbance.  Respiratory: Negative for cough and shortness of breath.   Cardiovascular: Negative for chest pain.  Gastrointestinal: Negative for abdominal pain.   Genitourinary: Negative for dysuria.  Musculoskeletal: Positive for falls and gait problem.  Skin: Positive for wound. Negative for rash.  Neurological: Positive for loss of consciousness and weakness.    Physical Exam Updated Vital Signs BP (!) 145/86   Pulse (!) 122   Temp 99.6 F (37.6 C) (Oral)   Resp 20   SpO2 95%   Physical Exam Vitals and nursing note reviewed.  Constitutional:      General: She is not in acute distress.    Appearance: She is well-developed and well-nourished. She is cachectic.  HENT:     Head: Normocephalic and atraumatic.  Eyes:     Conjunctiva/sclera: Conjunctivae normal.  Cardiovascular:     Rate and Rhythm: Regular rhythm. Tachycardia present.     Heart sounds: No murmur heard.   Pulmonary:     Effort: Pulmonary effort is normal. No respiratory distress.     Breath sounds: Normal breath sounds.  Abdominal:     Palpations: Abdomen is soft.     Tenderness: There is no abdominal tenderness. There is no guarding or rebound.  Musculoskeletal:        General: No deformity or edema.     Cervical back: Neck supple.     Right lower leg: No edema.     Left lower leg: No edema.  Skin:    General: Skin is warm and dry.     Findings: No rash.     Comments: I do not see any obvious rash or lesions that would make me suspicious for bedbug bites.  She definitely has the beginnings of a sacral pressure wound although no skin break.  Neurological:     General: No focal deficit present.     Mental Status: She is alert.  Psychiatric:        Mood and Affect: Mood and affect normal.     ED Results / Procedures / Treatments   Labs (all labs ordered are listed, but only abnormal results are displayed) Labs Reviewed  URINALYSIS, ROUTINE W REFLEX MICROSCOPIC - Abnormal; Notable for the following components:      Result Value   APPearance HAZY (*)    Protein, ur 30 (*)  All other components within normal limits  BASIC METABOLIC PANEL - Abnormal;  Notable for the following components:   Potassium 3.4 (*)    Glucose, Bld 158 (*)    Calcium 8.2 (*)    All other components within normal limits  LACTIC ACID, PLASMA - Abnormal; Notable for the following components:   Lactic Acid, Venous 5.3 (*)    All other components within normal limits  CBC WITH DIFFERENTIAL/PLATELET - Abnormal; Notable for the following components:   WBC 17.6 (*)    RBC 3.28 (*)    Hemoglobin 7.2 (*)    HCT 25.7 (*)    MCV 78.4 (*)    MCH 22.0 (*)    MCHC 28.0 (*)    RDW 19.2 (*)    Platelets 440 (*)    Neutro Abs 14.5 (*)    Monocytes Absolute 1.2 (*)    Abs Immature Granulocytes 0.16 (*)    All other components within normal limits  LACTIC ACID, PLASMA - Abnormal; Notable for the following components:   Lactic Acid, Venous 5.6 (*)    All other components within normal limits  COMPREHENSIVE METABOLIC PANEL - Abnormal; Notable for the following components:   Calcium 8.3 (*)    Total Protein 6.3 (*)    Albumin 2.2 (*)    AST 12 (*)    All other components within normal limits  MAGNESIUM - Abnormal; Notable for the following components:   Magnesium 1.3 (*)    All other components within normal limits  CBC - Abnormal; Notable for the following components:   WBC 17.0 (*)    RBC 3.06 (*)    Hemoglobin 6.6 (*)    HCT 24.2 (*)    MCV 79.1 (*)    MCH 21.6 (*)    MCHC 27.3 (*)    RDW 19.2 (*)    Platelets 572 (*)    All other components within normal limits  LACTIC ACID, PLASMA - Abnormal; Notable for the following components:   Lactic Acid, Venous 6.0 (*)    All other components within normal limits  CULTURE, BLOOD (ROUTINE X 2)  CULTURE, BLOOD (ROUTINE X 2)  SARS CORONAVIRUS 2 (TAT 6-24 HRS)  URINE CULTURE  PROCALCITONIN  PROCALCITONIN  TSH  CBC WITH DIFFERENTIAL/PLATELET  PROTIME-INR  CORTISOL-AM, BLOOD  LACTIC ACID, PLASMA    EKG None  Radiology DG Chest Port 1 View  Result Date: 01/29/2020 CLINICAL DATA:  Weakness EXAM: PORTABLE  CHEST 1 VIEW COMPARISON:  10/11/2019 radiograph and chest CT, PET CT 10/01/2019 FINDINGS: Possible tiny right pleural effusion. Increased bibasilar opacity. Nodular appearance of pulmonary parenchyma. Stable cardiomediastinal silhouette with aortic atherosclerosis. No pneumothorax. Chronic appearing rib fractures. Stable cystic lucency at the right glenoid. Known osseous metastatic disease better seen on CT. IMPRESSION: Possible tiny right pleural effusion. Increased bibasilar opacities, either reflecting increased pulmonary metastatic disease versus mild pneumonia. Diffuse nodular appearance of pulmonary parenchyma, felt to correspond to CT demonstrated metastatic lung nodules. Electronically Signed   By: Jasmine Pang M.D.   On: 01/29/2020 17:57    Procedures .Critical Care Performed by: Terrilee Files, MD Authorized by: Terrilee Files, MD   Critical care provider statement:    Critical care time (minutes):  45   Critical care time was exclusive of:  Separately billable procedures and treating other patients   Critical care was necessary to treat or prevent imminent or life-threatening deterioration of the following conditions:  Sepsis   Critical care was time spent  personally by me on the following activities:  Discussions with consultants, evaluation of patient's response to treatment, examination of patient, ordering and performing treatments and interventions, ordering and review of laboratory studies, ordering and review of radiographic studies, pulse oximetry, re-evaluation of patient's condition, obtaining history from patient or surrogate, review of old charts and development of treatment plan with patient or surrogate   (including critical care time)  Medications Ordered in ED Medications  lactated ringers infusion ( Intravenous New Bag/Given 01/30/20 0553)  vancomycin (VANCOREADY) IVPB 500 mg/100 mL (0 mg Intravenous Stopped 01/29/20 2344)  fentaNYL (DURAGESIC) 25 MCG/HR 1 patch  (has no administration in time range)  oxyCODONE (Oxy IR/ROXICODONE) immediate release tablet 5-10 mg (5 mg Oral Given 01/30/20 0050)  amLODipine (NORVASC) tablet 10 mg (has no administration in time range)  losartan (COZAAR) tablet 50 mg (has no administration in time range)  simvastatin (ZOCOR) tablet 20 mg (has no administration in time range)  mirtazapine (REMERON) tablet 15 mg (15 mg Oral Given 01/30/20 0050)  oxybutynin (DITROPAN-XL) 24 hr tablet 10 mg (10 mg Oral Given 01/30/20 0050)  cilostazol (PLETAL) tablet 100 mg (100 mg Oral Patient Refused/Not Given 01/30/20 0102)  gabapentin (NEURONTIN) capsule 200 mg (200 mg Oral Given 01/30/20 0050)  feeding supplement (ENSURE ENLIVE / ENSURE PLUS) liquid 237 mL (has no administration in time range)  albuterol (VENTOLIN HFA) 108 (90 Base) MCG/ACT inhaler 1 puff (has no administration in time range)  fluticasone (FLONASE) 50 MCG/ACT nasal spray 1 spray (has no administration in time range)  heparin injection 5,000 Units (5,000 Units Subcutaneous Given 01/30/20 0550)  acetaminophen (TYLENOL) tablet 650 mg (has no administration in time range)    Or  acetaminophen (TYLENOL) suppository 650 mg (has no administration in time range)  polyethylene glycol (MIRALAX / GLYCOLAX) packet 17 g (has no administration in time range)  ondansetron (ZOFRAN) tablet 4 mg (has no administration in time range)    Or  ondansetron (ZOFRAN) injection 4 mg (has no administration in time range)  ceFEPIme (MAXIPIME) 2 g in sodium chloride 0.9 % 100 mL IVPB (has no administration in time range)  sodium chloride 0.9 % bolus 500 mL (0 mLs Intravenous Stopped 01/29/20 1836)  lactated ringers bolus 500 mL (0 mLs Intravenous Stopped 01/29/20 2344)  metroNIDAZOLE (FLAGYL) IVPB 500 mg (0 mg Intravenous Stopped 01/29/20 2344)  potassium chloride (KLOR-CON) packet 20 mEq (20 mEq Oral Given 01/29/20 2130)    ED Course  I have reviewed the triage vital signs and the nursing  notes.  Pertinent labs & imaging results that were available during my care of the patient were reviewed by me and considered in my medical decision making (see chart for details).  Clinical Course as of 01/30/20 1021  Tue Jan 29, 2020  1730 Social work has been in contact with hospice.  It sounds like the patient will need a palliative care consult.  They have unfortunately left for the day. [MB]  1901 EKG not crossing into epic.  She is sinus tachycardia rate of 122 low voltage no acute ST-T changes.  Normal axis [MB]  1919 Lactic just came back and is elevated at 5.3.  Have ordered more fluids and undifferentiated sepsis antibiotics. [MB]  2055 Discussed with Dr. Carren Rang who will evaluate the patient for admission. [MB]    Clinical Course User Index [MB] Terrilee Files, MD   MDM Rules/Calculators/[name]  This patient complains of generalized weakness and inability to care for self; this involves an extensive number of treatment Options and is a complaint that carries with it a high risk of complications and Morbidity. The differential includes metastatic cancer, anemia, renal failure, sepsis, Sirs, pneumonia, metabolic derangement, failure to thrive  I ordered, reviewed and interpreted labs, which included CBC with elevated white count, stable low hemoglobin, chemistries fairly unremarkable other than mildly low potassium lactic acid markedly elevated and will need to be trended I ordered medication IV fluids IV antibiotics I ordered imaging studies which included chest x-ray and I independently    visualized and interpreted imaging which showed probable metastatic disease Previous records obtained and reviewed in epic including oncology notes from earlier today I consulted [facility] hospitalist Dr. Carren Rang And discussed lab and imaging findings  Critical Interventions: Work-up and management of patient's generalized weakness and identification of sepsis  with aggressive fluids and antibiotics  After the interventions stated above, I reevaluated the patient and found patient remained tachycardic but otherwise not in significant distress.  She is comfortable plan for admission.  She likely will need evaluation by palliative and hospice as her long-term prognosis is poor.   Final Clinical Impression(s) / ED Diagnoses Final diagnoses:  Sepsis, due to unspecified organism, unspecified whether acute organ dysfunction present [facility])  Renal cell carcinoma, unspecified laterality [facility])    Rx / DC Orders ED Discharge Orders    None       Terrilee Files, MD 01/30/20 1027

## 2020-01-29 NOTE — Progress Notes (Signed)
Pt is not alert and I am unable to ask assessment questions or weigh the patient.  Dr. Delton Coombes notified.  I am currently unable to perform the medication reconciliation.

## 2020-01-29 NOTE — ED Notes (Signed)
CRITICAL VALUE ALERT  Critical Value:  Hemoglobin 6.4  Date & Time Notied:  01/29/20 1846  Provider Notified: Dr. Melina Copa  Orders Received/Actions taken: EDP notified

## 2020-01-29 NOTE — Progress Notes (Signed)
[facility] CSW Progress Notes  CSW informed that patient needs SNF placement due to inability to walk/care for herself, lives alone, no family support. Was brought to [facility] this morning being carried by community member who assists with transportation.  Needed to be carried as she could not walk independently.  At request of oncology team, CSW attempted to find SNF placement for patient - called all facilities in [facility] - no beds available at [facility] , [place], [place], [place].  No answer from [facility] despite VMs left.  Palliative care w [facility] has been engaged w patient at home for palliative care not hospice.  Spoke w Lenoria Farrier NP, palliative care practitioner who has followed patient in the community.  Learned their residential hospice facility does not have open beds at this time - [place] will call other local hospice homes ([place], [place], [place]) to determine whether they may have a bed.  Awaiting call back re bed availability at other residential hospice facilities.  Nursing staff advised that no SNF beds or residential hospice beds are available at this time.    Call fro [place] at [facility] which has beds at their facilities in either [facility] or [place].  She will need to speak directly with patient or someone who can speak on patient behalf, patient will need to agree to DNR, comfort care and have a two week or less life expectancy.  Sheree can be reached at [phone] or direct cell [phone]   Message sent to [facility] ED CSWs who will be taking over this patient's disposition.  Edwyna Shell, LCSW Clinical Social Worker Phone:  [phone]

## 2020-01-29 NOTE — Progress Notes (Signed)
Pharmacy Antibiotic Note  Alicia [name] is a 80 y.o. female admitted on 01/29/2020 with sepsis.  Pharmacy has been consulted for Vanco, Cefepime dosing.  PMH: metastatic right renal cell carcinoma, liver nodules and lung nodules  Significant events: Ipilimumab and nivolumab x 3 cycles from 11/15/2019 to 12/27/2019  ID: Sepsis/ Temp 96.8-99.6. WBC 17.1. Tachy.Scr ok. Lactic acid 5.3. - Last weight 08/09/19: 36.3kg  Vanco 1/11>> Cefepime 1/11>>  Vancomycin 500 mg IV Q 24 hrs. Goal AUC 400-550. Expected AUC: 477 SCr used: 0.8  Plan: Vancomycin 500mg  IV q24h Cefepime 1g IV q24h     Temp (24hrs), Avg:97.9 F (36.6 C), Min:96.8 F (36 C), Max:99.6 F (37.6 C)  Recent Labs  Lab 01/29/20 0940 01/29/20 1652 01/29/20 1732  WBC 17.1*  --   --   CREATININE 0.57 0.63  --   LATICACIDVEN  --   --  5.3*    CrCl cannot be calculated (Unknown ideal weight.).    Allergies  Allergen Reactions  . Aspirin Other (See Comments)    stomach upset    Macari Zalesky S. Alford Highland, PharmD, BCPS Clinical Staff Pharmacist [email or web address] [place] 01/29/2020 7:58 PM

## 2020-01-29 NOTE — Progress Notes (Signed)
Patient unable to be placed in nursing home today.  I have called down to the ER and spoke with Legrand Como, Agricultural consultant and gave report.

## 2020-01-29 NOTE — ED Triage Notes (Signed)
Pt arrives from cancer center. Per nurse at cancer center pt was brought in by a neighbor for treatment today. Pt is under hospice care at this time however, pt is unable to be placed in SNF due to bed begs. Per the cancer center the hospice house is full at this time. Pt has no complaints at this time.

## 2020-01-29 NOTE — ED Notes (Signed)
Patient states that she is hungry and thirsty at this time. Made patient aware that I would have to check with RN patient states that Dr Delton Coombes said she can have anything she likes " Im dying anyways".

## 2020-01-30 ENCOUNTER — Inpatient Hospital Stay (HOSPITAL_COMMUNITY)
Admission: RE | Admit: 2020-01-30 | Discharge: 2020-02-04 | Disposition: A | Discharge status: Hospice | Source: Ambulatory Visit | Attending: Internal Medicine | Admitting: Internal Medicine

## 2020-01-30 DIAGNOSIS — F32A Depression, unspecified: Secondary | ICD-10-CM | POA: Diagnosis present

## 2020-01-30 DIAGNOSIS — C649 Malignant neoplasm of unspecified kidney, except renal pelvis: Secondary | ICD-10-CM | POA: Diagnosis present

## 2020-01-30 DIAGNOSIS — J309 Allergic rhinitis, unspecified: Secondary | ICD-10-CM | POA: Diagnosis present

## 2020-01-30 DIAGNOSIS — Z9842 Cataract extraction status, left eye: Secondary | ICD-10-CM

## 2020-01-30 DIAGNOSIS — J189 Pneumonia, unspecified organism: Secondary | ICD-10-CM | POA: Diagnosis present

## 2020-01-30 DIAGNOSIS — R6520 Severe sepsis without septic shock: Secondary | ICD-10-CM | POA: Diagnosis present

## 2020-01-30 DIAGNOSIS — Z961 Presence of intraocular lens: Secondary | ICD-10-CM | POA: Diagnosis present

## 2020-01-30 DIAGNOSIS — A419 Sepsis, unspecified organism: Principal | ICD-10-CM

## 2020-01-30 DIAGNOSIS — Z681 Body mass index (BMI) 19 or less, adult: Secondary | ICD-10-CM

## 2020-01-30 DIAGNOSIS — M654 Radial styloid tenosynovitis [de Quervain]: Secondary | ICD-10-CM | POA: Diagnosis present

## 2020-01-30 DIAGNOSIS — Z66 Do not resuscitate: Secondary | ICD-10-CM | POA: Diagnosis present

## 2020-01-30 DIAGNOSIS — C7800 Secondary malignant neoplasm of unspecified lung: Secondary | ICD-10-CM | POA: Diagnosis present

## 2020-01-30 DIAGNOSIS — E872 Acidosis: Secondary | ICD-10-CM | POA: Diagnosis present

## 2020-01-30 DIAGNOSIS — Z9221 Personal history of antineoplastic chemotherapy: Secondary | ICD-10-CM

## 2020-01-30 DIAGNOSIS — E7800 Pure hypercholesterolemia, unspecified: Secondary | ICD-10-CM | POA: Diagnosis present

## 2020-01-30 DIAGNOSIS — E785 Hyperlipidemia, unspecified: Secondary | ICD-10-CM | POA: Diagnosis present

## 2020-01-30 DIAGNOSIS — Z20822 Contact with and (suspected) exposure to covid-19: Secondary | ICD-10-CM | POA: Diagnosis present

## 2020-01-30 DIAGNOSIS — J440 Chronic obstructive pulmonary disease with acute lower respiratory infection: Secondary | ICD-10-CM | POA: Diagnosis present

## 2020-01-30 DIAGNOSIS — Z886 Allergy status to analgesic agent status: Secondary | ICD-10-CM

## 2020-01-30 DIAGNOSIS — E43 Unspecified severe protein-calorie malnutrition: Secondary | ICD-10-CM | POA: Diagnosis present

## 2020-01-30 DIAGNOSIS — I10 Essential (primary) hypertension: Secondary | ICD-10-CM | POA: Diagnosis present

## 2020-01-30 DIAGNOSIS — M25512 Pain in left shoulder: Secondary | ICD-10-CM | POA: Diagnosis not present

## 2020-01-30 DIAGNOSIS — Z8249 Family history of ischemic heart disease and other diseases of the circulatory system: Secondary | ICD-10-CM

## 2020-01-30 DIAGNOSIS — Z515 Encounter for palliative care: Secondary | ICD-10-CM

## 2020-01-30 DIAGNOSIS — Z79899 Other long term (current) drug therapy: Secondary | ICD-10-CM

## 2020-01-30 DIAGNOSIS — Z9841 Cataract extraction status, right eye: Secondary | ICD-10-CM

## 2020-01-30 DIAGNOSIS — Z8711 Personal history of peptic ulcer disease: Secondary | ICD-10-CM

## 2020-01-30 DIAGNOSIS — Z87891 Personal history of nicotine dependence: Secondary | ICD-10-CM

## 2020-01-30 DIAGNOSIS — I739 Peripheral vascular disease, unspecified: Secondary | ICD-10-CM | POA: Diagnosis present

## 2020-01-30 DIAGNOSIS — F419 Anxiety disorder, unspecified: Secondary | ICD-10-CM | POA: Diagnosis present

## 2020-01-30 DIAGNOSIS — R54 Age-related physical debility: Secondary | ICD-10-CM | POA: Diagnosis present

## 2020-01-30 DIAGNOSIS — G43909 Migraine, unspecified, not intractable, without status migrainosus: Secondary | ICD-10-CM | POA: Diagnosis present

## 2020-01-30 LAB — CBC
HCT: 24.2 % — ABNORMAL LOW (ref 36.0–46.0)
Hemoglobin: 6.6 g/dL — CL (ref 12.0–15.0)
MCH: 21.6 pg — ABNORMAL LOW (ref 26.0–34.0)
MCHC: 27.3 g/dL — ABNORMAL LOW (ref 30.0–36.0)
MCV: 79.1 fL — ABNORMAL LOW (ref 80.0–100.0)
Platelets: 572 10*3/uL — ABNORMAL HIGH (ref 150–400)
RBC: 3.06 MIL/uL — ABNORMAL LOW (ref 3.87–5.11)
RDW: 19.2 % — ABNORMAL HIGH (ref 11.5–15.5)
WBC: 17 10*3/uL — ABNORMAL HIGH (ref 4.0–10.5)
nRBC: 0.1 % (ref 0.0–0.2)

## 2020-01-30 LAB — LACTIC ACID, PLASMA
Lactic Acid, Venous: 5.6 mmol/L (ref 0.5–1.9)
Lactic Acid, Venous: 6 mmol/L (ref 0.5–1.9)
Lactic Acid, Venous: 6 mmol/L (ref 0.5–1.9)

## 2020-01-30 LAB — COMPREHENSIVE METABOLIC PANEL
ALT: 9 U/L (ref 0–44)
AST: 12 U/L — ABNORMAL LOW (ref 15–41)
Albumin: 2.2 g/dL — ABNORMAL LOW (ref 3.5–5.0)
Alkaline Phosphatase: 116 U/L (ref 38–126)
Anion gap: 14 (ref 5–15)
BUN: 16 mg/dL (ref 8–23)
CO2: 23 mmol/L (ref 22–32)
Calcium: 8.3 mg/dL — ABNORMAL LOW (ref 8.9–10.3)
Chloride: 100 mmol/L (ref 98–111)
Creatinine, Ser: 0.51 mg/dL (ref 0.44–1.00)
GFR, Estimated: >60 mL/min (ref >60)
Glucose, Bld: 92 mg/dL (ref 70–99)
Potassium: 4 mmol/L (ref 3.5–5.1)
Sodium: 137 mmol/L (ref 135–145)
Total Bilirubin: 0.5 mg/dL (ref 0.3–1.2)
Total Protein: 6.3 g/dL — ABNORMAL LOW (ref 6.5–8.1)

## 2020-01-30 LAB — TSH: TSH: 1.472 u[IU]/mL (ref 0.350–4.500)

## 2020-01-30 LAB — MAGNESIUM: Magnesium: 1.3 mg/dL — ABNORMAL LOW (ref 1.7–2.4)

## 2020-01-30 LAB — PROTIME-INR
INR: 1.2 (ref 0.8–1.2)
Prothrombin Time: 14.6 seconds (ref 11.4–15.2)

## 2020-01-30 LAB — SARS CORONAVIRUS 2 (TAT 6-24 HRS): SARS Coronavirus 2: NEGATIVE

## 2020-01-30 LAB — CORTISOL-AM, BLOOD: Cortisol - AM: 22.6 ug/dL (ref 6.7–22.6)

## 2020-01-30 LAB — PROCALCITONIN: Procalcitonin: 0.2 ng/mL

## 2020-01-30 MED ORDER — SODIUM CHLORIDE 0.9 % IV SOLN: 2.0000 g | INTRAVENOUS | Status: DC

## 2020-01-30 MED ORDER — POLYETHYLENE GLYCOL 3350 17 G PO PACK: 17.0000 g | PACK | Freq: Every day | ORAL | Status: DC | PRN

## 2020-01-30 MED ORDER — LORAZEPAM 2 MG/ML IJ SOLN: 1.0000 mg | INTRAMUSCULAR | Status: DC | PRN

## 2020-01-30 MED ORDER — GABAPENTIN 100 MG PO CAPS
200.0000 mg | ORAL_CAPSULE | Freq: Every day | ORAL | Status: DC
  Administered 2020-01-30: 200 mg via ORAL
  Filled 2020-01-30: qty 2

## 2020-01-30 MED ORDER — HEPARIN SODIUM (PORCINE) 5000 UNIT/ML IJ SOLN
5000.0000 [IU] | Freq: Three times a day (TID) | INTRAMUSCULAR | Status: DC
  Administered 2020-01-30: 5000 [IU] via SUBCUTANEOUS
  Filled 2020-01-30 (×2): qty 1

## 2020-01-30 MED ORDER — CILOSTAZOL 100 MG PO TABS
100.0000 mg | ORAL_TABLET | Freq: Two times a day (BID) | ORAL | Status: DC
  Administered 2020-01-30: 100 mg via ORAL
  Filled 2020-01-30: qty 1

## 2020-01-30 MED ORDER — BIOTENE DRY MOUTH MT LIQD: 15.0000 mL | OROMUCOSAL | Status: DC | PRN

## 2020-01-30 MED ORDER — ONDANSETRON HCL 4 MG/2ML IJ SOLN
4.0000 mg | Freq: Four times a day (QID) | INTRAMUSCULAR | Status: DC | PRN
  Administered 2020-02-04: 4 mg via INTRAVENOUS
  Filled 2020-01-30: qty 2

## 2020-01-30 MED ORDER — GLYCOPYRROLATE 0.2 MG/ML IJ SOLN: 0.2000 mg | INTRAMUSCULAR | Status: DC | PRN

## 2020-01-30 MED ORDER — GLYCOPYRROLATE 1 MG PO TABS: 1.0000 mg | ORAL_TABLET | ORAL | Status: DC | PRN

## 2020-01-30 MED ORDER — HALOPERIDOL LACTATE 2 MG/ML PO CONC
0.5000 mg | ORAL | Status: DC | PRN
  Filled 2020-01-30: qty 0.3

## 2020-01-30 MED ORDER — LOSARTAN POTASSIUM 50 MG PO TABS
50.0000 mg | ORAL_TABLET | Freq: Every day | ORAL | Status: DC
Start: 2020-01-30 — End: 2020-01-30
  Administered 2020-01-30: 50 mg via ORAL
  Filled 2020-01-30: qty 1

## 2020-01-30 MED ORDER — ACETAMINOPHEN 325 MG PO TABS: 650.0000 mg | ORAL_TABLET | Freq: Four times a day (QID) | ORAL | Status: DC | PRN

## 2020-01-30 MED ORDER — ONDANSETRON 4 MG PO TBDP: 4.0000 mg | ORAL_TABLET | Freq: Four times a day (QID) | ORAL | Status: DC | PRN

## 2020-01-30 MED ORDER — OXYCODONE HCL 5 MG PO TABS
5.0000 mg | ORAL_TABLET | Freq: Four times a day (QID) | ORAL | Status: DC | PRN
  Administered 2020-01-30: 5 mg via ORAL
  Filled 2020-01-30: qty 1

## 2020-01-30 MED ORDER — FLUTICASONE PROPIONATE 50 MCG/ACT NA SUSP
1.0000 | Freq: Every day | NASAL | Status: DC
  Administered 2020-01-30: 1 via NASAL
  Filled 2020-01-30: qty 16

## 2020-01-30 MED ORDER — MORPHINE SULFATE (PF) 2 MG/ML IV SOLN
1.0000 mg | INTRAVENOUS | Status: DC | PRN
Start: 2020-01-30 — End: 2020-02-04
  Administered 2020-01-31 – 2020-02-03 (×5): 1 mg via INTRAVENOUS
  Filled 2020-01-30 (×6): qty 1

## 2020-01-30 MED ORDER — HALOPERIDOL LACTATE 5 MG/ML IJ SOLN: 0.5000 mg | INTRAMUSCULAR | Status: DC | PRN

## 2020-01-30 MED ORDER — AMLODIPINE BESYLATE 5 MG PO TABS
10.0000 mg | ORAL_TABLET | Freq: Every day | ORAL | Status: DC
Start: 2020-01-30 — End: 2020-01-30
  Administered 2020-01-30: 10 mg via ORAL
  Filled 2020-01-30: qty 2

## 2020-01-30 MED ORDER — ALBUTEROL SULFATE HFA 108 (90 BASE) MCG/ACT IN AERS: 1.0000 | INHALATION_SPRAY | RESPIRATORY_TRACT | Status: DC | PRN

## 2020-01-30 MED ORDER — FENTANYL 25 MCG/HR TD PT72
1.0000 | MEDICATED_PATCH | TRANSDERMAL | Status: DC
  Administered 2020-01-30: 1 via TRANSDERMAL
  Filled 2020-01-30: qty 1

## 2020-01-30 MED ORDER — ONDANSETRON HCL 4 MG/2ML IJ SOLN: 4.0000 mg | Freq: Four times a day (QID) | INTRAMUSCULAR | Status: DC | PRN

## 2020-01-30 MED ORDER — ACETAMINOPHEN 325 MG PO TABS
650.0000 mg | ORAL_TABLET | Freq: Four times a day (QID) | ORAL | Status: DC | PRN
  Filled 2020-01-30 (×2): qty 2

## 2020-01-30 MED ORDER — HALOPERIDOL 0.5 MG PO TABS: 0.5000 mg | ORAL_TABLET | ORAL | Status: DC | PRN

## 2020-01-30 MED ORDER — SIMVASTATIN 20 MG PO TABS: 20.0000 mg | ORAL_TABLET | Freq: Every day | ORAL | Status: DC

## 2020-01-30 MED ORDER — ENSURE ENLIVE PO LIQD: 237.0000 mL | Freq: Two times a day (BID) | ORAL | Status: DC

## 2020-01-30 MED ORDER — MIRTAZAPINE 15 MG PO TABS
15.0000 mg | ORAL_TABLET | Freq: Every day | ORAL | Status: DC
  Administered 2020-01-30: 15 mg via ORAL
  Filled 2020-01-30: qty 1

## 2020-01-30 MED ORDER — ONDANSETRON HCL 4 MG PO TABS: 4.0000 mg | ORAL_TABLET | Freq: Four times a day (QID) | ORAL | Status: DC | PRN

## 2020-01-30 MED ORDER — MORPHINE SULFATE (PF) 2 MG/ML IV SOLN: 1.0000 mg | INTRAVENOUS | Status: DC | PRN

## 2020-01-30 MED ORDER — ACETAMINOPHEN 650 MG RE SUPP: 650.0000 mg | Freq: Four times a day (QID) | RECTAL | Status: DC | PRN

## 2020-01-30 MED ORDER — OXYBUTYNIN CHLORIDE ER 5 MG PO TB24
10.0000 mg | ORAL_TABLET | Freq: Every day | ORAL | Status: DC
  Administered 2020-01-30: 10 mg via ORAL
  Filled 2020-01-30: qty 2

## 2020-01-30 MED ORDER — POLYVINYL ALCOHOL 1.4 % OP SOLN: 1.0000 [drp] | Freq: Four times a day (QID) | OPHTHALMIC | Status: DC | PRN

## 2020-01-30 NOTE — Progress Notes (Signed)
Patient has a prognosis of 2 weeks or less given her current condition severe sepsis with pneumonia and worsening in the setting of metastatic renal cell carcinoma.  Plan to initiate comfort care later today with hopeful transfer to hospice facility which she is agreeable to.

## 2020-01-30 NOTE — Progress Notes (Signed)
PT Cancellation Note  Patient Details Name: Alicia [name] MRN: [id number] DOB: 19-Aug-1940   Cancelled Treatment:    Reason Eval/Treat Not Completed: Other (comment).  Per notes in chart patient has agreed to comfort care and plans to go to a hospice facility.     10:47 AM, 01/30/20 [name], MPT Physical Therapist with [facility] [phone] office [phone] mobile phone

## 2020-01-30 NOTE — TOC Initial Note (Signed)
Transition of Care [facility]) - Initial/Assessment Note   Patient Details  Name: Alicia [name] MRN: [id number] Date of Birth: 1940-11-09  Transition of Care [facility]) CM/SW Contact:    Sherie Don, LCSW Phone Number: 01/30/2020, 1:04 PM  Clinical Narrative: Patient is a 80 year old female who was admitted for sepsis. Patient was referred to [facility], but no longer wishes to go to the hospice facility as it is in [place]. CSW received verbal consent from patient to make hospice referral to [facility]. CSW made referral to [place] with [facility]. Initial referral sent in [place]. RN to come to hospital to go over paperwork with patient. Once admitted, patient's chart can be flipped. Patient to be made GIP pending bed at [facility]. Hospitalist and RN updated. TOC to follow.  Expected Discharge Plan: [place] Barriers to Discharge: Hospice Bed not available  Patient Goals and CMS Choice Patient states their goals for this hospitalization and ongoing recovery are:: Go to [facility].gov Compare Post Acute Care list provided to:: Patient Choice offered to / list presented to : Patient  Expected Discharge Plan and Services Expected Discharge Plan: [place] In-house Referral: Clinical Social Work Discharge Planning Services: NA Post Acute Care Choice: Hospice Living arrangements for the past 2 months: Single Family Home              DME Arranged: N/A DME Agency: NA HH Agency: [facility]  Prior Living Arrangements/Services Living arrangements for the past 2 months: [place] Lives with:: Self Patient language and need for interpreter reviewed:: Yes Do you feel safe going back to the place where you live?: Yes      Need for Family Participation in Patient Care: No (Comment) Care giver support system in place?: Yes (comment) Criminal Activity/Legal Involvement Pertinent to  Current Situation/Hospitalization: No - Comment as needed  Activities of Daily Living Home Assistive Devices/Equipment: Bedside commode/3-in-1 ADL Screening (condition at time of admission) Patient's cognitive ability adequate to safely complete daily activities?: No Is the patient deaf or have difficulty hearing?: Yes Does the patient have difficulty seeing, even when wearing glasses/contacts?: No Does the patient have difficulty concentrating, remembering, or making decisions?: Yes Patient able to express need for assistance with ADLs?: Yes Does the patient have difficulty dressing or bathing?: Yes Independently performs ADLs?: No Communication: Independent Dressing (OT): Needs assistance Is this a change from baseline?: Pre-admission baseline Grooming: Needs assistance Is this a change from baseline?: Pre-admission baseline Feeding: Independent Is this a change from baseline?: Pre-admission baseline Bathing: Needs assistance Is this a change from baseline?: Pre-admission baseline Toileting: Needs assistance Is this a change from baseline?: Pre-admission baseline In/Out Bed: Needs assistance Is this a change from baseline?: Pre-admission baseline Walks in Home: Needs assistance Is this a change from baseline?: Pre-admission baseline Does the patient have difficulty walking or climbing stairs?: Yes Weakness of Legs: Both Weakness of Arms/Hands: Both  Permission Sought/Granted Permission sought to share information with : Chartered certified accountant granted to share info w AGENCY: [facility]  Emotional Assessment Appearance:: Appears stated age Attitude/Demeanor/Rapport: Lethargic Affect (typically observed): Accepting Orientation: : Oriented to Self,Oriented to Place,Oriented to  Time,Oriented to Situation Alcohol / Substance Use: Not Applicable Psych Involvement: No (comment)  Admission diagnosis:  Renal cell carcinoma, unspecified  laterality ([place]) [C64.9] Sepsis ([place]) [A41.9] Sepsis, due to unspecified organism, unspecified whether acute organ dysfunction present (  [place]) [A41.9] Patient Active Problem List   Diagnosis Date Noted  . Sepsis ([place]) 01/29/2020  . Hypokalemia 01/29/2020  . Protein-calorie malnutrition, moderate ([place]) 01/29/2020  . Lactic acidosis 01/29/2020  . Kidney cancer, primary, with metastasis from kidney to other site [facility]) 11/12/2019  . Goals of care, counseling/discussion 11/12/2019  . Primary malignant neoplasm of kidney with metastasis from kidney to other site [facility]) 11/12/2019  . Renal mass 09/10/2019  . Lung nodules 09/10/2019  . Loss of weight 08/09/2019  . Early satiety 08/09/2019  . Lymphadenopathy, axillary 08/09/2019  . Fever, unspecified 07/26/2019  . Sensation of feeling cold- Bilat foot 12/03/2013  . Atherosclerosis of native arteries of the extremities with intermittent claudication 01/25/2011  . Shoulder pain, bilateral 01/04/2011  . LIPOMA 01/06/2010  . UNSPECIFIED CYST OF BONE 12/25/2009  . CONTRACTURE OF ANKLE AND FOOT JOINT 08/07/2009  . DISRUPTION OF INTERNAL OPERATION SURGICAL WOUND 05/22/2009  . CLOSED BIMALLEOLAR FRACTURE 02/12/2009  . RUPTURE ROTATOR CUFF 10/03/2008  . TOBACCO ABUSE 04/09/2008  . Pain in joint, shoulder region 04/09/2008  . CLAUDICATION, INTERMITTENT 07/11/2007  . FOOT PAIN, BILATERAL 07/11/2007  . OSTEOARTHRITIS 01/02/2007  . Nonspecific (abnormal) findings on radiological and other examination of body structure 08/03/2006  . ABNORMAL CHEST XRAY 08/03/2006  . HYPERLIPIDEMIA 06/07/2006  . ANXIETY 06/07/2006  . DEPRESSION 06/07/2006  . Essential hypertension 06/07/2006  . ALLERGIC RHINITIS 06/07/2006  . PEPTIC ULCER DISEASE 06/07/2006  . LOW BACK PAIN 06/07/2006   PCP:  Rosita Fire, MD Pharmacy:   [facility] [place], [place] [address] [place]  [address] Phone: [phone] Fax: [phone]  Readmission Risk Interventions Readmission Risk Prevention Plan 01/30/2020  Transportation Screening Complete  PCP or Specialist Appt within 3-5 Days Not Complete  Not Complete comments Patient being referred for residential hospice  Social Work Consult for [place] Planning/Counseling Complete  Palliative Care Screening Not Complete  Palliative Care Screening Not Complete Comments Palliative not available today; patient being referred to residential hospice  Medication Review (RN Care Manager) Complete  Some recent data might be hidden

## 2020-01-30 NOTE — Discharge Summary (Signed)
Physician Discharge Summary  Alicia [name]:[id number] DOB: 20-May-1940 DOA: 01/29/2020  PCP: Rosita Fire, MD  Admit date: 01/29/2020  Discharge date: 01/30/2020  Admitted From:Home  Disposition:  GIP  Home Health:None  Equipment/Devices:None  Discharge Condition:Stable  CODE STATUS: DNR  Diet recommendation: Heart Healthy  Brief/Interim Summary: Per HPI: Alicia [name]  is a 80 y.o. female, with history of peripheral vascular disease peptic ulcer disease, hypertension, hyperlipidemia, renal cancer with mets, depression, COPD, anxiety, and more presents ED with a chief complaint of generalized weakness.  Apparently patient was seen by Dr. Raliegh Ip earlier today in office, patient was so weak that she cannot be safely discharged home.  Dr. Raliegh Ip was trying to set up hospice or SNF placement for her but was not able to do so so he sent her to the ER.  Patient really does not have any infectious symptoms just reports generalized weakness.  Patient reports that her weakness started in September or October and has been getting gradually worse since.  Patient reports that she has been losing a lot of weight because she is so weak she cannot get out of bed to go feed herself.  She does report that her last normal meal was yesterday here at the hospital.  She lives at home alone with no home health.  Ports that she cannot walk anymore at all.  She does not think she has had any fevers, but she does admit to a recent dry cough.  She is vaccinated for COVID with her booster. patient reports that she is not on chemo and never has been, however according to oncology note she was on ipilimumab and nivolumab.  -Patient was admitted with generalized weakness in the setting of sepsis secondary to pneumonia with significant lactic acidosis.  She was also noted protein calorie malnutrition, in the setting of metastatic renal cell carcinoma.  Plans were being made to discharge her to inpatient hospice facility given very  poor survival expectancy in the coming weeks.  Patient does not want any aggressive measures and is DO NOT RESUSCITATE.  At this time, she has excepted inpatient comfort measures and hospice facility.  Currently, there are no beds available and therefore she will be general inpatient with hospice comfort care.  No acute events noted since her admission.  Discharge Diagnoses:  Active Problems:   Essential hypertension   Sepsis ([place])   Hypokalemia   Protein-calorie malnutrition, moderate (H[facility])   Lactic acidosis  Principal discharge diagnosis: Sepsis present on admission secondary to pneumonia with lactic acidosis.   Contact information for after-discharge care    [place] .   Service: Inpatient Hospice Contact information: [address] [place] [address] [phone]                 Allergies  Allergen Reactions  . Aspirin Other (See Comments)    stomach upset    Consultations:  None   Procedures/Studies: DG Chest Port 1 View  Result Date: 01/29/2020 CLINICAL DATA:  Weakness EXAM: PORTABLE CHEST 1 VIEW COMPARISON:  10/11/2019 radiograph and chest CT, PET CT 10/01/2019 FINDINGS: Possible tiny right pleural effusion. Increased bibasilar opacity. Nodular appearance of pulmonary parenchyma. Stable cardiomediastinal silhouette with aortic atherosclerosis. No pneumothorax. Chronic appearing rib fractures. Stable cystic lucency at the right glenoid. Known osseous metastatic disease better seen on CT. IMPRESSION: Possible tiny right pleural effusion. Increased bibasilar opacities, either reflecting increased pulmonary metastatic disease versus mild pneumonia. Diffuse nodular appearance  of pulmonary parenchyma, felt to correspond to CT demonstrated metastatic lung nodules. Electronically Signed   By: Donavan Foil M.D.   On: 01/29/2020 17:57      Discharge Exam: Vitals:   01/30/20 0024 01/30/20 0600  BP: (!) 149/86 (!)  170/92  Pulse: (!) 116 (!) 110  Resp: 19 20  Temp: 98 F (36.7 C) 97.8 F (36.6 C)  SpO2: 100% 97%   Vitals:   01/29/20 2015 01/29/20 2145 01/30/20 0024 01/30/20 0600  BP: (!) 173/93 (!) 161/94 (!) 149/86 (!) 170/92  Pulse: (!) 116 (!) 120 (!) 116 (!) 110  Resp: (!) 22 18 19 20   Temp:   98 F (36.7 C) 97.8 F (36.6 C)  TempSrc:    Oral  SpO2: 95% 100% 100% 97%    General: Pt is alert, awake, not in acute distress Cardiovascular: RRR, S1/S2 +, no rubs, no gallops Respiratory: CTA bilaterally, no wheezing, no rhonchi Abdominal: Soft, NT, ND, bowel sounds + Extremities: no edema, no cyanosis    The results of significant diagnostics from this hospitalization (including imaging, microbiology, ancillary and laboratory) are listed below for reference.     Microbiology: Recent Results (from the past 240 hour(s))  Culture, blood (routine x 2)     Status: None (Preliminary result)   Collection Time: 01/29/20  4:52 PM   Specimen: BLOOD  Result Value Ref Range Status   Specimen Description BLOOD  Final   Special Requests NONE  Final   Culture   Final    NO GROWTH < 12 HOURS Performed at [facility], [address], [place], [place] [address]    Report Status PENDING  Incomplete  Culture, blood (routine x 2)     Status: None (Preliminary result)   Collection Time: 01/29/20  5:32 PM   Specimen: BLOOD RIGHT ARM  Result Value Ref Range Status   Specimen Description BLOOD RIGHT ARM  Final   Special Requests   Final    BOTTLES DRAWN AEROBIC AND ANAEROBIC Blood Culture adequate volume   Culture   Final    NO GROWTH < 12 HOURS Performed at [facility], [address] [place], [place] [address]    Report Status PENDING  Incomplete  SARS CORONAVIRUS 2 (TAT 6-24 HRS) Nasopharyngeal Nasopharyngeal Swab     Status: None   Collection Time: 01/29/20  5:32 PM   Specimen: Nasopharyngeal Swab  Result Value Ref Range Status   SARS Coronavirus 2 NEGATIVE NEGATIVE Final    Comment:  (NOTE) SARS-CoV-2 target nucleic acids are NOT DETECTED.  The SARS-CoV-2 RNA is generally detectable in upper and lower respiratory specimens during the acute phase of infection. Negative results do not preclude SARS-CoV-2 infection, do not rule out co-infections with other pathogens, and should not be used as the sole basis for treatment or other patient management decisions. Negative results must be combined with clinical observations, patient history, and epidemiological information. The expected result is Negative.  Fact Sheet for Patients: SugarRoll.be  Fact Sheet for Healthcare Providers: https://www.woods-mathews.com/  This test is not yet approved or cleared by the [place] FDA and  has been authorized for detection and/or diagnosis of SARS-CoV-2 by FDA under an Emergency Use Authorization (EUA). This EUA will remain  in effect (meaning this test can be used) for the duration of the COVID-19 declaration under Se ction 564(b)(1) of the Act, 21 U.S.C. section 360bbb-3(b)(1), unless the authorization is terminated or revoked sooner.  Performed at [facility], [place]  [address], [place], [place] [address]      Labs: BNP (last 3 results) No results for input(s): BNP in the last 8760 hours. Basic Metabolic Panel: Recent Labs  Lab 01/29/20 0940 01/29/20 1652 01/30/20 0837  NA 137 136 137  K 3.9 3.4* 4.0  CL 97* 100 100  CO2 23 22 23   GLUCOSE 97 158* 92  BUN 17 20 16   CREATININE 0.57 0.63 0.51  CALCIUM 8.9 8.2* 8.3*  MG 1.6*  --  1.3*   Liver Function Tests: Recent Labs  Lab 01/29/20 0940 01/30/20 0837  AST 20 12*  ALT 12 9  ALKPHOS 155* 116  BILITOT 0.4 0.5  PROT 7.5 6.3*  ALBUMIN 2.5* 2.2*   No results for input(s): LIPASE, AMYLASE in the last 168 hours. No results for input(s): AMMONIA in the last 168 hours. CBC: Recent Labs  Lab 01/29/20 0940 01/29/20 1946 01/30/20 0837  WBC 17.1* 17.6* 17.0*   NEUTROABS 14.5* 14.5*  --   HGB 8.6* 7.2* 6.6*  HCT 30.8* 25.7* 24.2*  MCV 77.4* 78.4* 79.1*  PLT 722* 440* 572*   Cardiac Enzymes: No results for input(s): CKTOTAL, CKMB, CKMBINDEX, TROPONINI in the last 168 hours. BNP: Invalid input(s): POCBNP CBG: No results for input(s): GLUCAP in the last 168 hours. D-Dimer No results for input(s): DDIMER in the last 72 hours. Hgb A1c No results for input(s): HGBA1C in the last 72 hours. Lipid Profile No results for input(s): CHOL, HDL, LDLCALC, TRIG, CHOLHDL, LDLDIRECT in the last 72 hours. Thyroid function studies Recent Labs    01/30/20 0837  TSH 1.472   Anemia work up No results for input(s): VITAMINB12, FOLATE, FERRITIN, TIBC, IRON, RETICCTPCT in the last 72 hours. Urinalysis    Component Value Date/Time   COLORURINE YELLOW 01/29/2020 1950   APPEARANCEUR HAZY (A) 01/29/2020 1950   APPEARANCEUR Hazy (A) 10/23/2019 1453   LABSPEC 1.016 01/29/2020 1950   PHURINE 5.0 01/29/2020 1950   GLUCOSEU NEGATIVE 01/29/2020 1950   HGBUR NEGATIVE 01/29/2020 1950   HGBUR small 12/05/2006 [place] 01/29/2020 1950   BILIRUBINUR Negative 10/23/2019 [place] 01/29/2020 1950   PROTEINUR 30 (A) 01/29/2020 1950   UROBILINOGEN 0.2 07/23/2010 2045   NITRITE NEGATIVE 01/29/2020 1950   LEUKOCYTESUR NEGATIVE 01/29/2020 1950   Sepsis Labs Invalid input(s): PROCALCITONIN,  WBC,  LACTICIDVEN Microbiology Recent Results (from the past 240 hour(s))  Culture, blood (routine x 2)     Status: None (Preliminary result)   Collection Time: 01/29/20  4:52 PM   Specimen: BLOOD  Result Value Ref Range Status   Specimen Description BLOOD  Final   Special Requests NONE  Final   Culture   Final    NO GROWTH < 12 HOURS Performed at [facility], [address] [place], [place] [address]    Report Status PENDING  Incomplete  Culture, blood (routine x 2)     Status: None (Preliminary result)   Collection Time: 01/29/20   5:32 PM   Specimen: BLOOD RIGHT ARM  Result Value Ref Range Status   Specimen Description BLOOD RIGHT ARM  Final   Special Requests   Final    BOTTLES DRAWN AEROBIC AND ANAEROBIC Blood Culture adequate volume   Culture   Final    NO GROWTH < 12 HOURS Performed at [facility], [address], [place], [place] [address]    Report Status PENDING  Incomplete  SARS CORONAVIRUS 2 (TAT 6-24 HRS) Nasopharyngeal Nasopharyngeal Swab  Status: None   Collection Time: 01/29/20  5:32 PM   Specimen: Nasopharyngeal Swab  Result Value Ref Range Status   SARS Coronavirus 2 NEGATIVE NEGATIVE Final    Comment: (NOTE) SARS-CoV-2 target nucleic acids are NOT DETECTED.  The SARS-CoV-2 RNA is generally detectable in upper and lower respiratory specimens during the acute phase of infection. Negative results do not preclude SARS-CoV-2 infection, do not rule out co-infections with other pathogens, and should not be used as the sole basis for treatment or other patient management decisions. Negative results must be combined with clinical observations, patient history, and epidemiological information. The expected result is Negative.  Fact Sheet for Patients: SugarRoll.be  Fact Sheet for Healthcare Providers: https://www.woods-mathews.com/  This test is not yet approved or cleared by the [place] FDA and  has been authorized for detection and/or diagnosis of SARS-CoV-2 by FDA under an Emergency Use Authorization (EUA). This EUA will remain  in effect (meaning this test can be used) for the duration of the COVID-19 declaration under Se ction 564(b)(1) of the Act, 21 U.S.C. section 360bbb-3(b)(1), unless the authorization is terminated or revoked sooner.  Performed at [facility], [place] [address], [place], [place] [address]      Time coordinating discharge: 35 minutes  SIGNED:   Rodena Goldmann, DO [facility] 01/30/2020, 3:05 PM  If  7PM-7AM, please contact night-coverage [email or web address]

## 2020-01-30 NOTE — H&P (Signed)
History and Physical    Alicia [name] RJJ:[id number] DOB: 17-May-1940 DOA: 01/30/2020  PCP: Rosita Fire, MD   Patient coming from:   Chief Complaint:   HPI: Alicia [name] is a 80 y.o. female with medical history significant for peripheral vascular disease peptic ulcer disease, hypertension, hyperlipidemia, renal cancer with mets, depression, COPD, anxiety, and more presents ED with a chief complaint of generalized weakness. Apparently patient was seen by Dr. Raliegh Ip earlier today in office, patient was so weak that she cannot be safely discharged home. Dr. Raliegh Ip was trying to set up hospice or SNF placement for her but was not able to do so so he sent her to the ER. Patient really does not have any infectious symptoms just reports generalized weakness.Patient reports that her weakness started in September orOctober and has been getting gradually worse since. Patient reports that she has been losing a lot of weight because she is so weak she cannot get out of bed to go feed herself. She does report that her last normal meal was yesterday here at the hospital. She lives at home alone with no home health. Ports that she cannot walk anymore at all. She does not think she has had any fevers, but she does admit to a recent dry cough. She is vaccinated for COVID with her booster. patient reports that she is not on chemo and never has been, however according to oncology note she was on ipilimumab and nivolumab.  -Patient was admitted with generalized weakness in the setting of sepsis secondary to pneumonia with significant lactic acidosis.  She was also noted protein calorie malnutrition, in the setting of metastatic renal cell carcinoma.  Plans were being made to discharge her to inpatient hospice facility given very poor survival expectancy in the coming weeks.  Patient does not want any aggressive measures and is DO NOT RESUSCITATE.  At this time, she has excepted inpatient comfort measures and hospice  facility.  Currently, there are no beds available and therefore she will be general inpatient with hospice comfort care.  No acute events noted since her admission.    Past Medical History:  Diagnosis Date   Allergic rhinitis    Anxiety    Cataract    COPD (chronic obstructive pulmonary disease) (HCC)    Depression    DeQuervain's disease (tenosynovitis)    High cholesterol    History of kidney cancer    Hyperlipidemia    Hypertension    Irritable bowel syndrome with constipation    Knee fracture    Low back pain    Migraines    Osteoarthritis    Peptic ulcer disease    PVD (peripheral vascular disease) ([place])    Right rotator cuff tear     Past Surgical History:  Procedure Laterality Date   back tumor     BIOPSY  08/31/2019   Procedure: BIOPSY;  Surgeon: Harvel Quale, MD;  [facility];  Service: Gastroenterology;;   breast tumor     CATARACT EXTRACTION W/PHACO Right 03/13/2012   Procedure: CATARACT EXTRACTION PHACO AND INTRAOCULAR LENS PLACEMENT ([place]);  Surgeon: Tonny Branch, MD;  Location: [facility];  Service: Ophthalmology;  Laterality: Right;  CDE:15.51   CATARACT EXTRACTION W/PHACO Left 03/27/2012   Procedure: CATARACT EXTRACTION PHACO AND INTRAOCULAR LENS PLACEMENT (IOC);  Surgeon: Tonny Branch, MD;  Location: [facility];  Service: Ophthalmology;  Laterality: Left;  CDE=10.91   cataract surgery     COLONOSCOPY  12/08/2011   Procedure: COLONOSCOPY;  Surgeon: Daneil Dolin, MD;  Location: [facility];  Service: Endoscopy;  Laterality: N/A;  11:15 AM   COLONOSCOPY WITH PROPOFOL N/A 08/31/2019   Procedure: COLONOSCOPY WITH PROPOFOL;  Surgeon: Harvel Quale, MD;  Location[facility];  Service: Gastroenterology;  Laterality: N/A;  815   CYST EXCISION Right July 2015   Right Preauricular area   ESOPHAGOGASTRODUODENOSCOPY (EGD) WITH PROPOFOL N/A 08/31/2019   Procedure: ESOPHAGOGASTRODUODENOSCOPY (EGD) WITH PROPOFOL;   Surgeon: Harvel Quale, MD;  Location: [facility];  Service: Gastroenterology;  Laterality: N/A;   PR VEIN BYPASS GRAFT,AORTO-FEM-POP     right ankle otif     right femoral to above the knee popliteal bypass     right femur fracture     right inguinal herniorraphy       reports that she quit smoking about 7 months ago. Her smoking use included cigarettes. She has a 104.00 pack-year smoking history. She has never used smokeless tobacco. She reports that she does not drink alcohol and does not use drugs.  Allergies  Allergen Reactions   Aspirin Other (See Comments)    stomach upset    Family History  Problem Relation Age of Onset   Hypertension Mother    Heart attack Mother    Stroke Mother    Colon cancer Neg Hx     Prior to Admission medications   Medication Sig Start Date End Date Taking? Authorizing Provider  albuterol (VENTOLIN HFA) 108 (90 Base) MCG/ACT inhaler Inhale 2 puffs into the lungs every 4 (four) hours as needed for wheezing or shortness of breath. 11/28/19   [provider]  amLODipine (NORVASC) 10 MG tablet Take 10 mg by mouth daily.    [provider]  amoxicillin-clavulanate (AUGMENTIN) 875-125 MG tablet Take 1 tablet by mouth 2 (two) times daily. For 7 days    [provider]  cilostazol (PLETAL) 100 MG tablet Take 100 mg by mouth 2 (two) times daily.    [provider]  Ensure (ENSURE) Take 237 mLs by mouth 2 (two) times daily.    [provider]  fentaNYL (DURAGESIC) 25 MCG/HR Place 1 patch onto the skin every 3 (three) days. 12/27/19   Nicholas Lose, MD  fluticasone (FLONASE) 50 MCG/ACT nasal spray Place 1 spray into both nostrils daily. 12/09/19   [provider]  gabapentin (NEURONTIN) 100 MG capsule Take 100 mg by mouth 2 (two) times daily.    [provider]  HYDROcodone-acetaminophen (NORCO/VICODIN) 5-325 MG tablet Take 1 tablet by mouth every 8 (eight) hours as needed  for moderate pain.    [provider]  Ipilimumab (YERVOY IV) Inject into the vein every 21 ( twenty-one) days. X 4 cycles then d/c 11/15/19   [provider]  losartan (COZAAR) 50 MG tablet Take 50 mg by mouth daily.    [provider]  magnesium oxide (MAG-OX) 400 (241.3 Mg) MG tablet Take 1 tablet (400 mg total) by mouth daily. 11/23/19   Jacquelin Hawking, NP  mirtazapine (REMERON) 15 MG tablet Take 15 mg by mouth at bedtime.    [provider]  Nivolumab (OPDIVO IV) Inject into the vein every 21 ( twenty-one) days. X 4 cycles, then q 28 days 11/15/19   [provider]  oxybutynin (DITROPAN-XL) 10 MG 24 hr tablet Take 10 mg by mouth at bedtime.    [provider]  oxyCODONE (OXY IR/ROXICODONE) 5 MG immediate release tablet Take 1-2 tablets (5-10 mg total)  by mouth every 6 (six) hours as needed for severe pain. Patient taking differently: Take 5 mg by mouth 4 (four) times daily as needed for severe pain. 12/06/19   Derek Jack, MD  risedronate (ACTONEL) 35 MG tablet Take 35 mg by mouth every Thursday. with water on empty stomach, nothing by mouth or lie down for next 30 minutes.    [provider]  simvastatin (ZOCOR) 20 MG tablet Take 20 mg by mouth at bedtime.    [provider]    Physical Exam: There were no vitals filed for this visit.  Constitutional: NAD, calm, comfortable There were no vitals filed for this visit. Eyes: lids and conjunctivae normal Neck: normal, supple Respiratory: clear to auscultation bilaterally. Normal respiratory effort. No accessory muscle use.  Cardiovascular: Regular rate and rhythm, no murmurs. Abdomen: no tenderness, no distention. Bowel sounds positive.  Musculoskeletal:  No edema. Skin: no rashes, lesions, ulcers.  Psychiatric: Flat affect  Labs on Admission: I have personally reviewed following labs and imaging studies  CBC: Recent Labs  Lab 01/29/20 0940  01/29/20 1946 01/30/20 0837  WBC 17.1* 17.6* 17.0*  NEUTROABS 14.5* 14.5*  --   HGB 8.6* 7.2* 6.6*  HCT 30.8* 25.7* 24.2*  MCV 77.4* 78.4* 79.1*  PLT 722* 440* 244*   Basic Metabolic Panel: Recent Labs  Lab 01/29/20 0940 01/29/20 1652 01/30/20 0837  NA 137 136 137  K 3.9 3.4* 4.0  CL 97* 100 100  CO2 23 22 23   GLUCOSE 97 158* 92  BUN 17 20 16   CREATININE 0.57 0.63 0.51  CALCIUM 8.9 8.2* 8.3*  MG 1.6*  --  1.3*   GFR: CrCl cannot be calculated (Unknown ideal weight.). Liver Function Tests: Recent Labs  Lab 01/29/20 0940 01/30/20 0837  AST 20 12*  ALT 12 9  ALKPHOS 155* 116  BILITOT 0.4 0.5  PROT 7.5 6.3*  ALBUMIN 2.5* 2.2*   No results for input(s): LIPASE, AMYLASE in the last 168 hours. No results for input(s): AMMONIA in the last 168 hours. Coagulation Profile: Recent Labs  Lab 01/30/20 0837  INR 1.2   Cardiac Enzymes: No results for input(s): CKTOTAL, CKMB, CKMBINDEX, TROPONINI in the last 168 hours. BNP (last 3 results) No results for input(s): PROBNP in the last 8760 hours. HbA1C: No results for input(s): HGBA1C in the last 72 hours. CBG: No results for input(s): GLUCAP in the last 168 hours. Lipid Profile: No results for input(s): CHOL, HDL, LDLCALC, TRIG, CHOLHDL, LDLDIRECT in the last 72 hours. Thyroid Function Tests: Recent Labs    01/30/20 0837  TSH 1.472   Anemia Panel: No results for input(s): VITAMINB12, FOLATE, FERRITIN, TIBC, IRON, RETICCTPCT in the last 72 hours. Urine analysis:    Component Value Date/Time   COLORURINE YELLOW 01/29/2020 1950   APPEARANCEUR HAZY (A) 01/29/2020 1950   APPEARANCEUR Hazy (A) 10/23/2019 1453   LABSPEC 1.016 01/29/2020 1950   PHURINE 5.0 01/29/2020 1950   GLUCOSEU NEGATIVE 01/29/2020 1950   HGBUR NEGATIVE 01/29/2020 1950   HGBUR small 12/05/2006 [place] 01/29/2020 1950   BILIRUBINUR Negative 10/23/2019 [place] 01/29/2020 1950   PROTEINUR 30 (A) 01/29/2020  1950   UROBILINOGEN 0.2 07/23/2010 2045   NITRITE NEGATIVE 01/29/2020 1950   LEUKOCYTESUR NEGATIVE 01/29/2020 1950    Radiological Exams on Admission: DG Chest Port 1 View  Result Date: 01/29/2020 CLINICAL DATA:  Weakness EXAM: PORTABLE CHEST 1 VIEW COMPARISON:  10/11/2019 radiograph and chest CT, PET CT  10/01/2019 FINDINGS: Possible tiny right pleural effusion. Increased bibasilar opacity. Nodular appearance of pulmonary parenchyma. Stable cardiomediastinal silhouette with aortic atherosclerosis. No pneumothorax. Chronic appearing rib fractures. Stable cystic lucency at the right glenoid. Known osseous metastatic disease better seen on CT. IMPRESSION: Possible tiny right pleural effusion. Increased bibasilar opacities, either reflecting increased pulmonary metastatic disease versus mild pneumonia. Diffuse nodular appearance of pulmonary parenchyma, felt to correspond to CT demonstrated metastatic lung nodules. Electronically Signed   By: Donavan Foil M.D.   On: 01/29/2020 17:57    Assessment/Plan Active Problems:   Severe sepsis (HCC)    Generalized weakness secondary to sepsis with pneumonia  Lactic acidosis secondary to above  Worsening metastatic renal cell carcinoma with failed outpatient chemotherapy  Protein calorie malnutrition  Hypertension  -Currently on comfort measures with plan for discharge to inpatient hospice facility once bed available.    DVT prophylaxis: None Code Status: DNR-comfort care Family Communication: No events noted Disposition Plan: GIP inpatient hospice Consults called: Palliative care Admission status: Inpatient, MedSurg   Raimundo Corbit D [place] Leap DO [facility]  If 7PM-7AM, please contact night-coverage [email or web address]  01/30/2020, 3:48 PM

## 2020-01-30 NOTE — TOC Progression Note (Signed)
Transition of Care [facility]) - Progression Note   Patient Details  Name: Alicia [name] MRN: [id number] Date of Birth: 1940/07/18  Transition of Care [facility]) CM/SW [place], LCSW Phone Number: 01/30/2020, 4:53 PM  Clinical Narrative: CSW notified by Cassandra with [facility] that patient has completed consents and the chart can be flipped. Patient to be made GIP. TOC awaiting hospice bed at [facility]  Readmission Risk Interventions Readmission Risk Prevention Plan 01/30/2020  Transportation Screening Complete  PCP or Specialist Appt within 3-5 Days Not Complete  Not Complete comments Patient being referred for residential hospice  Social Work Consult for [place] Planning/Counseling Complete  Palliative Care Screening Not Complete  Palliative Care Screening Not Complete Comments Palliative not available today; patient being referred to residential hospice  Medication Review (RN Transport planner) Complete  Some recent data might be hidden

## 2020-01-30 NOTE — Progress Notes (Signed)
   TC for referral from SW today [place]. She is asking what happens next to proceed with the referral process for a bed at the [facility]. She reports the pt will need to sign her own paperwork and gives me the number to her room so that I can call and talk with pt about hospice care, confirm her interest and goals to make sure she is in agreement with d/c plan.  I called room and talked with pt very briefly before the phone call ended abruptly. Unsure if she dropped phone or hung up. So return call X 2 and it appeared someone answered and then hung up without saying anything. I secure chatted the pt's nurse to assist with the process moving forward. The nurse has sent me a message back stating that when she went into talk with pt she has refused and stated she does not want to go to [place].  I have updated the SW [place] of pt's wishes.  Due to pt's wishes being stated we are not able to  move forward with referral process at this time.  Webb Silversmith RN [phone]

## 2020-01-30 NOTE — Progress Notes (Signed)
Notified patient that RN from hospice in [place] is trying to reach her. Patient indicated that she had no interest in being as far away as [place]. Notified RN [place] that of patient's wishes.

## 2020-01-30 NOTE — Progress Notes (Signed)
Nutrition Brief Note  Patient identified on the Malnutrition Screening Tool (MST) Report  RD working remotely.  Chart reviewed.  Pt now transitioning to comfort care and is awaiting bed at [facility].  No nutrition interventions warranted at this time. Please consult as needed.   Lajuan Lines, RD, LDN Clinical Nutrition After Hours/Weekend Pager # in [place]

## 2020-01-31 ENCOUNTER — Encounter (HOSPITAL_COMMUNITY): Payer: Self-pay | Admitting: Internal Medicine

## 2020-01-31 DIAGNOSIS — R6520 Severe sepsis without septic shock: Secondary | ICD-10-CM

## 2020-01-31 DIAGNOSIS — A419 Sepsis, unspecified organism: Principal | ICD-10-CM

## 2020-01-31 DIAGNOSIS — Z515 Encounter for palliative care: Secondary | ICD-10-CM

## 2020-01-31 LAB — URINE CULTURE: Culture: <10000 — AB

## 2020-01-31 MED ORDER — LORAZEPAM 0.5 MG PO TABS
0.5000 mg | ORAL_TABLET | Freq: Four times a day (QID) | ORAL | Status: DC | PRN
  Administered 2020-02-04: 0.5 mg via ORAL
  Filled 2020-01-31: qty 1

## 2020-01-31 MED ORDER — OXYCODONE HCL 5 MG PO TABS
5.0000 mg | ORAL_TABLET | ORAL | Status: DC | PRN
  Administered 2020-01-31 – 2020-02-01 (×3): 5 mg via ORAL
  Filled 2020-01-31 (×3): qty 1

## 2020-01-31 NOTE — Progress Notes (Signed)
PROGRESS NOTE    BRONWYN [name]  KNL:[id number] DOB: January 12, 1941 DOA: 01/30/2020 PCP: Rosita Fire, MD   Brief Narrative:  Alicia [name] is a 80 y.o. female with medical history significant for peripheral vascular disease peptic ulcer disease, hypertension, hyperlipidemia, renal cancer with mets, depression, COPD, anxiety, and more presents ED with a chief complaint of generalized weakness. Patient was admitted with generalized weakness in the setting of sepsis secondary to pneumonia with significant lactic acidosis. She was also noted protein calorie malnutrition, in the setting of metastatic renal cell carcinoma. Plans were being made to discharge her to inpatient hospice facility given very poor survival expectancy in the coming weeks. Patient does not want any aggressive measures and is DO NOT RESUSCITATE. At this time, she has excepted inpatient comfort measures and hospice facility. Currently, there are no beds available and therefore she will be general inpatientwith hospice comfort care.    Assessment & Plan:   Active Problems:   Severe sepsis (HCC)   Generalized weakness secondary to sepsis with pneumonia  Lactic acidosis secondary to above  Worsening metastatic renal cell carcinoma with failed outpatient chemotherapy  Protein calorie malnutrition  Hypertension  -Currently on comfort measures with plan for discharge to inpatient hospice facility once bed available.   DVT prophylaxis:None Code Status: DNR, Comfort care Family Communication: None at bedside Disposition Plan: Comfort care, awaiting inpatient hospice facility bed   Consultants:   Palliative   Procedures:   None  Antimicrobials:   None   Subjective: Patient seen and evaluated today with no new acute complaints or concerns. No acute concerns or events noted overnight.  Objective: There were no vitals filed for this visit.  Intake/Output Summary (Last 24 hours) at 01/31/2020  1050 Last data filed at 01/31/2020 0700 Gross per 24 hour  Intake -  Output 225 ml  Net -225 ml   There were no vitals filed for this visit.  Examination:  General exam: Appears calm and comfortable  Respiratory system: Clear to auscultation. Respiratory effort normal. Cardiovascular system: S1 & S2 heard, RRR.  Gastrointestinal system: Abdomen is soft Central nervous system: Alert and awake Extremities: No edema Skin: No significant lesions noted Psychiatry: Flat affect.    Data Reviewed: I have personally reviewed following labs and imaging studies  CBC: Recent Labs  Lab 01/29/20 0940 01/29/20 1946 01/30/20 0837  WBC 17.1* 17.6* 17.0*  NEUTROABS 14.5* 14.5*  --   HGB 8.6* 7.2* 6.6*  HCT 30.8* 25.7* 24.2*  MCV 77.4* 78.4* 79.1*  PLT 722* 440* 790*   Basic Metabolic Panel: Recent Labs  Lab 01/29/20 0940 01/29/20 1652 01/30/20 0837  NA 137 136 137  K 3.9 3.4* 4.0  CL 97* 100 100  CO2 23 22 23   GLUCOSE 97 158* 92  BUN 17 20 16   CREATININE 0.57 0.63 0.51  CALCIUM 8.9 8.2* 8.3*  MG 1.6*  --  1.3*   GFR: CrCl cannot be calculated (Unknown ideal weight.). Liver Function Tests: Recent Labs  Lab 01/29/20 0940 01/30/20 0837  AST 20 12*  ALT 12 9  ALKPHOS 155* 116  BILITOT 0.4 0.5  PROT 7.5 6.3*  ALBUMIN 2.5* 2.2*   No results for input(s): LIPASE, AMYLASE in the last 168 hours. No results for input(s): AMMONIA in the last 168 hours. Coagulation Profile: Recent Labs  Lab 01/30/20 0837  INR 1.2   Cardiac Enzymes: No results for input(s): CKTOTAL, CKMB, CKMBINDEX, TROPONINI in the last 168 hours. BNP (last 3 results) No results  for input(s): PROBNP in the last 8760 hours. HbA1C: No results for input(s): HGBA1C in the last 72 hours. CBG: No results for input(s): GLUCAP in the last 168 hours. Lipid Profile: No results for input(s): CHOL, HDL, LDLCALC, TRIG, CHOLHDL, LDLDIRECT in the last 72 hours. Thyroid Function Tests: Recent Labs     01/30/20 0837  TSH 1.472   Anemia Panel: No results for input(s): VITAMINB12, FOLATE, FERRITIN, TIBC, IRON, RETICCTPCT in the last 72 hours. Sepsis Labs: Recent Labs  Lab 01/29/20 1732 01/29/20 1946 01/30/20 0112 01/30/20 0837 01/30/20 1020  PROCALCITON  --  0.21  --  0.20  --   LATICACIDVEN 5.3*  --  5.6* 6.0* 6.0*    Recent Results (from the past 240 hour(s))  Culture, blood (routine x 2)     Status: None (Preliminary result)   Collection Time: 01/29/20  4:52 PM   Specimen: BLOOD  Result Value Ref Range Status   Specimen Description BLOOD  Final   Special Requests NONE  Final   Culture   Final    NO GROWTH 2 DAYS Performed at [facility], [address], [place], [place] [address]    Report Status PENDING  Incomplete  Culture, blood (routine x 2)     Status: None (Preliminary result)   Collection Time: 01/29/20  5:32 PM   Specimen: BLOOD RIGHT ARM  Result Value Ref Range Status   Specimen Description BLOOD RIGHT ARM  Final   Special Requests   Final    BOTTLES DRAWN AEROBIC AND ANAEROBIC Blood Culture adequate volume   Culture   Final    NO GROWTH 2 DAYS Performed at [facility], [address], [place], [place] [address]    Report Status PENDING  Incomplete  SARS CORONAVIRUS 2 (TAT 6-24 HRS) Nasopharyngeal Nasopharyngeal Swab     Status: None   Collection Time: 01/29/20  5:32 PM   Specimen: Nasopharyngeal Swab  Result Value Ref Range Status   SARS Coronavirus 2 NEGATIVE NEGATIVE Final    Comment: (NOTE) SARS-CoV-2 target nucleic acids are NOT DETECTED.  The SARS-CoV-2 RNA is generally detectable in upper and lower respiratory specimens during the acute phase of infection. Negative results do not preclude SARS-CoV-2 infection, do not rule out co-infections with other pathogens, and should not be used as the sole basis for treatment or other patient management decisions. Negative results must be combined with clinical observations, patient history, and  epidemiological information. The expected result is Negative.  Fact Sheet for Patients: SugarRoll.be  Fact Sheet for Healthcare Providers: https://www.woods-mathews.com/  This test is not yet approved or cleared by the [place] FDA and  has been authorized for detection and/or diagnosis of SARS-CoV-2 by FDA under an Emergency Use Authorization (EUA). This EUA will remain  in effect (meaning this test can be used) for the duration of the COVID-19 declaration under Se ction 564(b)(1) of the Act, 21 U.S.C. section 360bbb-3(b)(1), unless the authorization is terminated or revoked sooner.  Performed at [facility], [place] [address] [place], [place] [address]   Urine culture     Status: Abnormal   Collection Time: 01/29/20  7:50 PM   Specimen: Urine, Clean Catch  Result Value Ref Range Status   Specimen Description   Final    URINE, CLEAN CATCH Performed at [facility], [address], [place], [place] [address]    Special Requests   Final    NONE Performed at [facility], [name], [place],  [address]  Culture (A)  Final    <10,000 COLONIES/mL INSIGNIFICANT GROWTH Performed at [facility] [address], [place], [place] [address]    Report Status 01/31/2020 FINAL  Final         Radiology Studies: DG Chest Port 1 View  Result Date: 01/29/2020 CLINICAL DATA:  Weakness EXAM: PORTABLE CHEST 1 VIEW COMPARISON:  10/11/2019 radiograph and chest CT, PET CT 10/01/2019 FINDINGS: Possible tiny right pleural effusion. Increased bibasilar opacity. Nodular appearance of pulmonary parenchyma. Stable cardiomediastinal silhouette with aortic atherosclerosis. No pneumothorax. Chronic appearing rib fractures. Stable cystic lucency at the right glenoid. Known osseous metastatic disease better seen on CT. IMPRESSION: Possible tiny right pleural effusion. Increased bibasilar opacities, either reflecting increased pulmonary  metastatic disease versus mild pneumonia. Diffuse nodular appearance of pulmonary parenchyma, felt to correspond to CT demonstrated metastatic lung nodules. Electronically Signed   By: Donavan Foil M.D.   On: 01/29/2020 17:57     LOS: 1 day    Time spent: 35 minutes    Trenia Tennyson Darleen Crocker, DO [facility]  If 7PM-7AM, please contact night-coverage [email or web address] 01/31/2020, 10:50 AM

## 2020-01-31 NOTE — TOC Progression Note (Signed)
Transition of Care [facility]) - Progression Note   Patient Details  Name: Alicia [name] MRN: [id number] Date of Birth: 05/13/1940  Transition of Care [facility]) CM/SW [place], LCSW Phone Number: 01/31/2020, 10:08 AM  Clinical Narrative: CSW followed up with Cassandra with [facility]. Per Vito Backers, a bed at [facility] is not available yet. TOC to follow.  Expected Discharge Plan: [place] Barriers to Discharge: Hospice Bed not available  Expected Discharge Plan and Services Expected Discharge Plan: [place] In-house Referral: Clinical Social Work Discharge Planning Services: NA Post Acute Care Choice: Hospice Living arrangements for the past 2 months: Single Family Home              DME Arranged: N/A DME Agency: NA [place] Agency: [facility]  Readmission Risk Interventions Readmission Risk Prevention Plan 01/30/2020  Transportation Screening Complete  PCP or Specialist Appt within 3-5 Days Not Complete  Not Complete comments Patient being referred for residential hospice  Social Work Consult for [place] Planning/Counseling Complete  Palliative Care Screening Not Complete  Palliative Care Screening Not Complete Comments Palliative not available today; patient being referred to residential hospice  Medication Review (RN Transport planner) Complete  Some recent data might be hidden

## 2020-01-31 NOTE — Consult Note (Signed)
Consultation Note Date: 01/31/2020   Patient Name: Alicia [name]  DOB: [birth year]  MRN: [id number]  Age / Sex: 80 y.o., female  PCP: Rosita Fire, MD Referring Physician: Rodena Goldmann, DO  Reason for Consultation: Hospice Evaluation and Non pain symptom management  HPI/Patient Profile: 80 y.o. female  with past medical history of peripheral vascular disease peptic ulcer disease, hypertension, hyperlipidemia, renal cancer with mets, depression, COPD, anxiety admitted on 01/30/2020 with general inpatient hospice bed.  Accepted with hospice of [facility] for residential hospice, awaiting bed availability.   Clinical Assessment and Goals of Care: Alicia [name] has been accepted for residential hospice care at [facility], but unfortunately they have no beds available.  She is general inpatient status here at [facility] team consulted for symptom management as her goals are already set.  Alicia [name] is sitting up in the bed in her room.  She appears acutely/chronically ill and somewhat frail.  She is alert and oriented, able to make her needs known.  There is no family at bedside at this time.  Nursing staff is at bedside administering pain medications.  Alicia [name] shares that so far, medications for pain have been adequate.  Conference with attending, bedside nursing staff related to patient condition, needs, goals of care. End-of-life order set implemented.   HCPOA   OTHER -Alicia [name] is able to make her own decisions.  Otherwise she has a friend, Ramond Craver who assist with decision-making.    SUMMARY OF RECOMMENDATIONS   Full comfort care Awaiting residential hospice bed at [place], [place] Planning:  DNR  Symptom Management:   End of life order set implemented   Palliative Prophylaxis:    Frequent Pain Assessment, Oral Care and Turn Reposition  Additional Recommendations (Limitations, Scope, Preferences):  Full Comfort Care  Psycho-social/Spiritual:   Desire for further Chaplaincy support:no  Additional Recommendations: Education on Hospice  Prognosis:   < 2 weeks  Discharge Planning: GIP status, awaiting hospice bed at [place]       Primary Diagnoses: Present on Admission: . Severe sepsis ([place])   I have reviewed the medical record, interviewed the patient and family, and examined the patient. The following aspects are pertinent.  Past Medical History:  Diagnosis Date  . Allergic rhinitis   . Anxiety   . Cataract   . COPD (chronic obstructive pulmonary disease) ([place])   . Depression   . DeQuervain's disease (tenosynovitis)   . High cholesterol   . History of kidney cancer   . Hyperlipidemia   . Hypertension   . Irritable bowel syndrome with constipation   . Knee fracture   . Low back pain   . Migraines   . Osteoarthritis   . Peptic ulcer disease   . PVD (peripheral vascular disease) ([place])   . Right rotator cuff tear    Social History   Socioeconomic History  . Marital status: Single    Spouse name: Not on file  .  Number of children: 0  . Years of education: Not on file  . Highest education level: Not on file  Occupational History  . Not on file  Tobacco Use  . Smoking status: Former Smoker    Packs/day: 2.00    Years: 52.00    Pack years: 104.00    Types: Cigarettes    Quit date: 06/23/2019    Years since quitting: 0.6  . Smokeless tobacco: Never Used  . Tobacco comment: pt states she smokes about 3-4 cigarettes a day  Vaping Use  . Vaping Use: Not on file  Substance and Sexual Activity  . Alcohol use: No    Comment: she quit 1 year ago  . Drug use: No  . Sexual activity: Not on file  Other Topics Concern  . Not on file  Social History Narrative  . Not on file   Social Determinants of Health   Financial Resource  Strain: Low Risk   . Difficulty of Paying Living Expenses: Not very hard  Food Insecurity: No Food Insecurity  . Worried About Charity fundraiser in the Last Year: Never true  . Ran Out of Food in the Last Year: Never true  Transportation Needs: No Transportation Needs  . Lack of Transportation (Medical): No  . Lack of Transportation (Non-Medical): No  Physical Activity: Inactive  . Days of Exercise per Week: 0 days  . Minutes of Exercise per Session: 0 min  Stress: Stress Concern Present  . Feeling of Stress : To some extent  Social Connections: Moderately Isolated  . Frequency of Communication with Friends and Family: Three times a week  . Frequency of Social Gatherings with Friends and Family: Three times a week  . Attends Religious Services: More than 4 times per year  . Active Member of Clubs or Organizations: No  . Attends Archivist Meetings: Never  . Marital Status: Never married   Family History  Problem Relation Age of Onset  . Hypertension Mother   . Heart attack Mother   . Stroke Mother   . Colon cancer Neg Hx    Scheduled Meds: Continuous Infusions: PRN Meds:.acetaminophen **OR** acetaminophen, antiseptic oral rinse, glycopyrrolate **OR** glycopyrrolate **OR** glycopyrrolate, haloperidol **OR** haloperidol **OR** haloperidol lactate, morphine injection, ondansetron **OR** ondansetron (ZOFRAN) IV, oxyCODONE, polyvinyl alcohol Medications Prior to Admission:  Prior to Admission medications   Medication Sig Start Date End Date Taking? Authorizing Provider  albuterol (VENTOLIN HFA) 108 (90 Base) MCG/ACT inhaler Inhale 2 puffs into the lungs every 4 (four) hours as needed for wheezing or shortness of breath. 11/28/19   [provider]  amLODipine (NORVASC) 10 MG tablet Take 10 mg by mouth daily.    [provider]  amoxicillin-clavulanate (AUGMENTIN) 875-125 MG tablet Take 1 tablet by mouth 2 (two) times daily. For 7 days    [provider]  cilostazol (PLETAL) 100 MG tablet Take 100 mg by mouth 2 (two) times daily.    [provider]  Ensure (ENSURE) Take 237 mLs by mouth 2 (two) times daily.    [provider]  fentaNYL (DURAGESIC) 25 MCG/HR Place 1 patch onto the skin every 3 (three) days. 12/27/19   Nicholas Lose, MD  fluticasone (FLONASE) 50 MCG/ACT nasal spray Place 1 spray into both nostrils daily. 12/09/19   [provider]  gabapentin (NEURONTIN) 100 MG capsule Take 100 mg by mouth 2 (two) times daily.    [provider]  HYDROcodone-acetaminophen (NORCO/VICODIN) 5-325 MG tablet Take 1  tablet by mouth every 8 (eight) hours as needed for moderate pain.    [provider]  Ipilimumab (YERVOY IV) Inject into the vein every 21 ( twenty-one) days. X 4 cycles then d/c 11/15/19   [provider]  losartan (COZAAR) 50 MG tablet Take 50 mg by mouth daily.    [provider]  magnesium oxide (MAG-OX) 400 (241.3 Mg) MG tablet Take 1 tablet (400 mg total) by mouth daily. 11/23/19   Jacquelin Hawking, NP  mirtazapine (REMERON) 15 MG tablet Take 15 mg by mouth at bedtime.    [provider]  Nivolumab (OPDIVO IV) Inject into the vein every 21 ( twenty-one) days. X 4 cycles, then q 28 days 11/15/19   [provider]  oxybutynin (DITROPAN-XL) 10 MG 24 hr tablet Take 10 mg by mouth at bedtime.    [provider]  oxyCODONE (OXY IR/ROXICODONE) 5 MG immediate release tablet Take 1-2 tablets (5-10 mg total) by mouth every 6 (six) hours as needed for severe pain. Patient taking differently: Take 5 mg by mouth 4 (four) times daily as needed for severe pain. 12/06/19   Derek Jack, MD  risedronate (ACTONEL) 35 MG tablet Take 35 mg by mouth every Thursday. with water on empty stomach, nothing by mouth or lie down for next 30 minutes.    [provider]  simvastatin (ZOCOR) 20 MG tablet Take 20 mg by mouth at bedtime.    [provider]   Allergies  Allergen Reactions  . Aspirin Other (See Comments)    stomach upset   Review of Systems  Unable to perform ROS: Age    Physical Exam Vitals and nursing note reviewed.  Constitutional:      General: She is not in acute distress.    Appearance: She is ill-appearing.  HENT:     Head: Normocephalic and atraumatic.  Cardiovascular:     Rate and Rhythm: Normal rate.  Pulmonary:     Effort: Pulmonary effort is normal. No respiratory distress.  Skin:    General: Skin is warm and dry.  Neurological:     Mental Status: She is alert and oriented to person, place, and time.  Psychiatric:        Mood and Affect: Mood normal.        Behavior: Behavior normal.     Vital Signs: BP (!) 146/95 (BP Location: Right Arm)   Pulse (!) 114   Temp 98.3 F (36.8 C) (Oral)   Resp 19   SpO2 100%  Pain Scale: 0-10   Pain Score: 0-No pain   SpO2: SpO2: 100 % O2 Device:SpO2: 100 % O2 Flow Rate: .   IO: Intake/output summary:   Intake/Output Summary (Last 24 hours) at 01/31/2020 1520 Last data filed at 01/31/2020 1426 Gross per 24 hour  Intake 480 ml  Output 625 ml  Net -145 ml    LBM:   Baseline Weight:   Most recent weight:       Palliative Assessment/Data:   Flowsheet Rows   Flowsheet Row Most Recent Value  Intake Tab   Referral Department Hospitalist  Unit at Time of Referral Med/Surg Unit  Palliative Care Primary Diagnosis Other (Comment)  Date Notified 01/31/20  Palliative Care Type New Palliative care  Reason for referral End of Life Care Assistance  Date of Admission 01/30/20  Date first seen by Palliative Care 01/31/20  # of days Palliative referral response time 0 Day(s)  # of days IP prior  to Palliative referral 1  Clinical Assessment   Palliative Performance Scale Score 20%  Pain Max last 24 hours Not able to report  Pain Min Last 24 hours Not able to report  Dyspnea Max Last 24 Hours Not able to report  Dyspnea Min Last 24 hours  Not able to report  Psychosocial & Spiritual Assessment   Palliative Care Outcomes       Time In: 1400 Time Out: 1430 Time Total: 30 minutes  Greater than 50%  of this time was spent counseling and coordinating care related to the above assessment and plan.  Signed by: Drue Novel, NP   Please contact Palliative Medicine Team phone at [phone] for questions and concerns.  For individual provider: See Shea Evans

## 2020-02-01 DIAGNOSIS — R6520 Severe sepsis without septic shock: Secondary | ICD-10-CM

## 2020-02-01 MED ORDER — MUSCLE RUB 10-15 % EX CREA
TOPICAL_CREAM | CUTANEOUS | Status: DC | PRN
  Filled 2020-02-01: qty 85

## 2020-02-01 NOTE — TOC Progression Note (Signed)
Transition of Care [facility]) - Progression Note    Patient Details  Name: Alicia [name] MRN: [id number] Date of Birth: 1940-07-09  Transition of [facility]) CM/SW Contact  Salome Arnt, [place] Phone Number: 02/01/2020, 9:59 AM  Clinical Narrative:  LCSW spoke with Cassandra at [facility]. No bed available today at [facility]. Pt is GIP. TOC will continue to follow.       Expected Discharge Plan: [place] Barriers to Discharge: Hospice Bed not available  Expected Discharge Plan and Services Expected Discharge Plan: [place] In-house Referral: Clinical Social Work Discharge Planning Services: NA Post Acute Care Choice: Hospice Living arrangements for the past 2 months: Single Family Home                 DME Arranged: N/A DME Agency: NA         HH Agency: [facility]         Social Determinants of Health (SDOH) Interventions    Readmission Risk Interventions Readmission Risk Prevention Plan 01/30/2020  Transportation Screening Complete  PCP or Specialist Appt within 3-5 Days Not Complete  Not Complete comments Patient being referred for residential hospice  Social Work Consult for [place] Planning/Counseling Complete  Palliative Care Screening Not Complete  Palliative Care Screening Not Complete Comments Palliative not available today; patient being referred to residential hospice  Medication Review (RN Transport planner) Complete  Some recent data might be hidden

## 2020-02-01 NOTE — Progress Notes (Signed)
PROGRESS NOTE    Alicia [name]  AYT:[id number] DOB: 02/09/1940 DOA: 01/30/2020 PCP: Rosita Fire, MD   Brief Narrative:  Alicia [name] a 80 y.o.femalewith medical history significant forperipheral vascular disease peptic ulcer disease, hypertension, hyperlipidemia, renal cancer with mets, depression, COPD, anxiety, and more presents ED with a chief complaint of generalized weakness. Patient was admitted with generalized weakness in the setting of sepsis secondary to pneumonia with significant lactic acidosis. She was also noted protein calorie malnutrition, in the setting of metastatic renal cell carcinoma. Plans were being made to discharge her to inpatient hospice facility given very poor survival expectancy in the coming weeks. Patient does not want any aggressive measures and is DO NOT RESUSCITATE. At this time, she has excepted inpatient comfort measures and hospice facility. Currently, there are no beds available and therefore she will be general inpatientwith hospice comfort care.   Assessment & Plan:   Active Problems:   Severe sepsis (HCC)   Generalized weakness secondary to sepsis with pneumonia  Lactic acidosis secondary to above  Worsening metastatic renal cell carcinoma with failed outpatient chemotherapy  Protein calorie malnutrition  Hypertension  -Currently on comfort measures with plan for discharge toinpatienthospice facility once bed available.   DVT prophylaxis:None Code Status: DNR, Comfort care Family Communication: None at bedside Disposition Plan: Comfort care, awaiting inpatient hospice facility bed   Consultants:   Palliative   Procedures:   None  Antimicrobials:   None   Subjective: Patient seen and evaluated today with complaints of some left shoulder pain noted today. She states she has had this now for the last several days and denies any associated trauma.  Objective: Vitals:   01/31/20 1343 01/31/20 1504  01/31/20 2127  BP: 134/78 (!) 146/95 (!) 155/89  Pulse: (!) 120 (!) 114 (!) 129  Resp: 20 19   Temp: 98.4 F (36.9 C) 98.3 F (36.8 C) 98.9 F (37.2 C)  TempSrc: Oral Oral   SpO2: 100% 100% 99%    Intake/Output Summary (Last 24 hours) at 02/01/2020 1027 Last data filed at 01/31/2020 2143 Gross per 24 hour  Intake 780 ml  Output 700 ml  Net 80 ml   There were no vitals filed for this visit.  Examination:  General exam: Appears calm and comfortable  Respiratory system: Clear to auscultation. Respiratory effort normal.On [place]. Cardiovascular system: S1 & S2 heard, RRR.  Gastrointestinal system: Abdomen is soft Central nervous system: Alert and awake Extremities: No edema Skin: No significant lesions noted Psychiatry: Flat affect.    Data Reviewed: I have personally reviewed following labs and imaging studies  CBC: Recent Labs  Lab 01/29/20 0940 01/29/20 1946 01/30/20 0837  WBC 17.1* 17.6* 17.0*  NEUTROABS 14.5* 14.5*  --   HGB 8.6* 7.2* 6.6*  HCT 30.8* 25.7* 24.2*  MCV 77.4* 78.4* 79.1*  PLT 722* 440* 355*   Basic Metabolic Panel: Recent Labs  Lab 01/29/20 0940 01/29/20 1652 01/30/20 0837  NA 137 136 137  K 3.9 3.4* 4.0  CL 97* 100 100  CO2 23 22 23   GLUCOSE 97 158* 92  BUN 17 20 16   CREATININE 0.57 0.63 0.51  CALCIUM 8.9 8.2* 8.3*  MG 1.6*  --  1.3*   GFR: CrCl cannot be calculated (Unknown ideal weight.). Liver Function Tests: Recent Labs  Lab 01/29/20 0940 01/30/20 0837  AST 20 12*  ALT 12 9  ALKPHOS 155* 116  BILITOT 0.4 0.5  PROT 7.5 6.3*  ALBUMIN 2.5* 2.2*   No  results for input(s): LIPASE, AMYLASE in the last 168 hours. No results for input(s): AMMONIA in the last 168 hours. Coagulation Profile: Recent Labs  Lab 01/30/20 0837  INR 1.2   Cardiac Enzymes: No results for input(s): CKTOTAL, CKMB, CKMBINDEX, TROPONINI in the last 168 hours. BNP (last 3 results) No results for input(s): PROBNP in the last 8760 hours. HbA1C: No  results for input(s): HGBA1C in the last 72 hours. CBG: No results for input(s): GLUCAP in the last 168 hours. Lipid Profile: No results for input(s): CHOL, HDL, LDLCALC, TRIG, CHOLHDL, LDLDIRECT in the last 72 hours. Thyroid Function Tests: Recent Labs    01/30/20 0837  TSH 1.472   Anemia Panel: No results for input(s): VITAMINB12, FOLATE, FERRITIN, TIBC, IRON, RETICCTPCT in the last 72 hours. Sepsis Labs: Recent Labs  Lab 01/29/20 1732 01/29/20 1946 01/30/20 0112 01/30/20 0837 01/30/20 1020  PROCALCITON  --  0.21  --  0.20  --   LATICACIDVEN 5.3*  --  5.6* 6.0* 6.0*    Recent Results (from the past 240 hour(s))  Culture, blood (routine x 2)     Status: None (Preliminary result)   Collection Time: 01/29/20  4:52 PM   Specimen: BLOOD  Result Value Ref Range Status   Specimen Description BLOOD  Final   Special Requests NONE  Final   Culture   Final    NO GROWTH 3 DAYS Performed at [facility], [address], [place], [place] [address]    Report Status PENDING  Incomplete  Culture, blood (routine x 2)     Status: None (Preliminary result)   Collection Time: 01/29/20  5:32 PM   Specimen: BLOOD RIGHT ARM  Result Value Ref Range Status   Specimen Description BLOOD RIGHT ARM  Final   Special Requests   Final    BOTTLES DRAWN AEROBIC AND ANAEROBIC Blood Culture adequate volume   Culture   Final    NO GROWTH 3 DAYS Performed at [facility], [address], [place], [place] [address]    Report Status PENDING  Incomplete  SARS CORONAVIRUS 2 (TAT 6-24 HRS) Nasopharyngeal Nasopharyngeal Swab     Status: None   Collection Time: 01/29/20  5:32 PM   Specimen: Nasopharyngeal Swab  Result Value Ref Range Status   SARS Coronavirus 2 NEGATIVE NEGATIVE Final    Comment: (NOTE) SARS-CoV-2 target nucleic acids are NOT DETECTED.  The SARS-CoV-2 RNA is generally detectable in upper and lower respiratory specimens during the acute phase of infection. Negative results do not  preclude SARS-CoV-2 infection, do not rule out co-infections with other pathogens, and should not be used as the sole basis for treatment or other patient management decisions. Negative results must be combined with clinical observations, patient history, and epidemiological information. The expected result is Negative.  Fact Sheet for Patients: SugarRoll.be  Fact Sheet for Healthcare Providers: https://www.woods-mathews.com/  This test is not yet approved or cleared by the [place] FDA and  has been authorized for detection and/or diagnosis of SARS-CoV-2 by FDA under an Emergency Use Authorization (EUA). This EUA will remain  in effect (meaning this test can be used) for the duration of the COVID-19 declaration under Se ction 564(b)(1) of the Act, 21 U.S.C. section 360bbb-3(b)(1), unless the authorization is terminated or revoked sooner.  Performed at [facility], [place] [address], [place], [place] [address]   Urine culture     Status: Abnormal   Collection Time: 01/29/20  7:50 PM   Specimen: Urine, Clean  Catch  Result Value Ref Range Status   Specimen Description   Final    URINE, CLEAN CATCH Performed at [facility], [address], [place], [place] [address]    Special Requests   Final    NONE Performed at [facility], [address] [place], [place] [address]    Culture (A)  Final    <10,000 COLONIES/mL INSIGNIFICANT GROWTH Performed at [facility] [address], [place], [place] [address]    Report Status 01/31/2020 FINAL  Final         Radiology Studies: No results found.       LOS: 2 days    Time spent: 35 minutes    Pratik Darleen Crocker, DO [facility]  If 7PM-7AM, please contact night-coverage [email or web address] 02/01/2020, 10:27 AM

## 2020-02-01 NOTE — Progress Notes (Signed)
Notified by NT that patient's purse had multiple bugs observed crawling on it. Notified environmental with instructions to tie up all of patient's belongings in trash bags. Patient placed on contact isolation. No bugs have been observed on patient's person.

## 2020-02-02 NOTE — Progress Notes (Signed)
PROGRESS NOTE    Alicia [name]  YYQ:[id number] DOB: 09-11-1940 DOA: 01/30/2020 PCP: Rosita Fire, MD   Brief Narrative:  Alicia [name] a 80 y.o.femalewith medical history significant forperipheral vascular disease peptic ulcer disease, hypertension, hyperlipidemia, renal cancer with mets, depression, COPD, anxiety, and more presents ED with a chief complaint of generalized weakness. Patient was admitted with generalized weakness in the setting of sepsis secondary to pneumonia with significant lactic acidosis. She was also noted protein calorie malnutrition, in the setting of metastatic renal cell carcinoma. Plans were being made to discharge her to inpatient hospice facility given very poor survival expectancy in the coming weeks. Patient does not want any aggressive measures and is DO NOT RESUSCITATE. At this time, she has excepted inpatient comfort measures and hospice facility. Currently, there are no beds available and therefore she will be general inpatientwith hospice comfort care.  Assessment & Plan:   Active Problems:   Severe sepsis (HCC)   Generalized weakness secondary to sepsis with pneumonia  Lactic acidosis secondary to above  Worsening metastatic renal cell carcinoma with failed outpatient chemotherapy  Protein calorie malnutrition  Hypertension  -Currently on comfort measures with plan for discharge toinpatienthospice facility once bed available.   DVT prophylaxis:None Code Status:DNR, Comfort care Family Communication:None at bedside Disposition Plan:Comfort care, awaiting inpatient hospice facility bed   Consultants:  Palliative  Procedures:  None  Antimicrobials:  None   Subjective: Patient seen and evaluated today with no new acute complaints or concerns. No acute concerns or events noted overnight.  Objective: Vitals:   01/31/20 1504 01/31/20 2127 02/01/20 1404 02/02/20 0546  BP: (!) 146/95 (!) 155/89 (!)  178/89 (!) 151/90  Pulse: (!) 114 (!) 129 (!) 119 (!) 121  Resp: 19  20 20   Temp: 98.3 F (36.8 C) 98.9 F (37.2 C) 99.1 F (37.3 C) 98.5 F (36.9 C)  TempSrc: Oral  Oral   SpO2: 100% 99% 99% 99%   No intake or output data in the 24 hours ending 02/02/20 1054 There were no vitals filed for this visit.  Examination:  General exam: Appears calm and comfortable  Respiratory system: Clear to auscultation. Respiratory effort normal. Cardiovascular system: S1 & S2 heard, RRR.  Gastrointestinal system: Abdomen is soft Central nervous system: Alert and awake Extremities: No edema Skin: No significant lesions noted Psychiatry: Flat affect.    Data Reviewed: I have personally reviewed following labs and imaging studies  CBC: Recent Labs  Lab 01/29/20 0940 01/29/20 1946 01/30/20 0837  WBC 17.1* 17.6* 17.0*  NEUTROABS 14.5* 14.5*  --   HGB 8.6* 7.2* 6.6*  HCT 30.8* 25.7* 24.2*  MCV 77.4* 78.4* 79.1*  PLT 722* 440* 888*   Basic Metabolic Panel: Recent Labs  Lab 01/29/20 0940 01/29/20 1652 01/30/20 0837  NA 137 136 137  K 3.9 3.4* 4.0  CL 97* 100 100  CO2 23 22 23   GLUCOSE 97 158* 92  BUN 17 20 16   CREATININE 0.57 0.63 0.51  CALCIUM 8.9 8.2* 8.3*  MG 1.6*  --  1.3*   GFR: CrCl cannot be calculated (Unknown ideal weight.). Liver Function Tests: Recent Labs  Lab 01/29/20 0940 01/30/20 0837  AST 20 12*  ALT 12 9  ALKPHOS 155* 116  BILITOT 0.4 0.5  PROT 7.5 6.3*  ALBUMIN 2.5* 2.2*   No results for input(s): LIPASE, AMYLASE in the last 168 hours. No results for input(s): AMMONIA in the last 168 hours. Coagulation Profile: Recent Labs  Lab 01/30/20 [phone]  INR 1.2   Cardiac Enzymes: No results for input(s): CKTOTAL, CKMB, CKMBINDEX, TROPONINI in the last 168 hours. BNP (last 3 results) No results for input(s): PROBNP in the last 8760 hours. HbA1C: No results for input(s): HGBA1C in the last 72 hours. CBG: No results for input(s): GLUCAP in the last 168  hours. Lipid Profile: No results for input(s): CHOL, HDL, LDLCALC, TRIG, CHOLHDL, LDLDIRECT in the last 72 hours. Thyroid Function Tests: No results for input(s): TSH, T4TOTAL, FREET4, T3FREE, THYROIDAB in the last 72 hours. Anemia Panel: No results for input(s): VITAMINB12, FOLATE, FERRITIN, TIBC, IRON, RETICCTPCT in the last 72 hours. Sepsis Labs: Recent Labs  Lab 01/29/20 1732 01/29/20 1946 01/30/20 0112 01/30/20 0837 01/30/20 1020  PROCALCITON  --  0.21  --  0.20  --   LATICACIDVEN 5.3*  --  5.6* 6.0* 6.0*    Recent Results (from the past 240 hour(s))  Culture, blood (routine x 2)     Status: None (Preliminary result)   Collection Time: 01/29/20  4:52 PM   Specimen: BLOOD  Result Value Ref Range Status   Specimen Description BLOOD  Final   Special Requests NONE  Final   Culture   Final    NO GROWTH 4 DAYS Performed at [facility], [address], [place], [place] [address]    Report Status PENDING  Incomplete  Culture, blood (routine x 2)     Status: None (Preliminary result)   Collection Time: 01/29/20  5:32 PM   Specimen: BLOOD RIGHT ARM  Result Value Ref Range Status   Specimen Description BLOOD RIGHT ARM  Final   Special Requests   Final    BOTTLES DRAWN AEROBIC AND ANAEROBIC Blood Culture adequate volume   Culture   Final    NO GROWTH 4 DAYS Performed at [facility], [address], [place], [place] [address]    Report Status PENDING  Incomplete  SARS CORONAVIRUS 2 (TAT 6-24 HRS) Nasopharyngeal Nasopharyngeal Swab     Status: None   Collection Time: 01/29/20  5:32 PM   Specimen: Nasopharyngeal Swab  Result Value Ref Range Status   SARS Coronavirus 2 NEGATIVE NEGATIVE Final    Comment: (NOTE) SARS-CoV-2 target nucleic acids are NOT DETECTED.  The SARS-CoV-2 RNA is generally detectable in upper and lower respiratory specimens during the acute phase of infection. Negative results do not preclude SARS-CoV-2 infection, do not rule out co-infections with other  pathogens, and should not be used as the sole basis for treatment or other patient management decisions. Negative results must be combined with clinical observations, patient history, and epidemiological information. The expected result is Negative.  Fact Sheet for Patients: SugarRoll.be  Fact Sheet for Healthcare Providers: https://www.woods-mathews.com/  This test is not yet approved or cleared by the [place] FDA and  has been authorized for detection and/or diagnosis of SARS-CoV-2 by FDA under an Emergency Use Authorization (EUA). This EUA will remain  in effect (meaning this test can be used) for the duration of the COVID-19 declaration under Se ction 564(b)(1) of the Act, 21 U.S.C. section 360bbb-3(b)(1), unless the authorization is terminated or revoked sooner.  Performed at [facility], [place] [address], [place], [place] [address]   Urine culture     Status: Abnormal   Collection Time: 01/29/20  7:50 PM   Specimen: Urine, Clean Catch  Result Value Ref Range Status   Specimen Description   Final    URINE, CLEAN CATCH Performed at [facility], [place]  [address] [place], [place] [address]    Special Requests   Final    NONE Performed at [facility], [address] [place], [place] [address]    Culture (A)  Final    <10,000 COLONIES/mL INSIGNIFICANT GROWTH Performed at [place] [address] [place], [place] [address]    Report Status 01/31/2020 FINAL  Final         Radiology Studies: No results found.    LOS: 3 days    Time spent: 35 minutes    Yarima Penman Darleen Crocker, DO [facility]  If 7PM-7AM, please contact night-coverage [email or web address] 02/02/2020, 10:54 AM

## 2020-02-03 LAB — CULTURE, BLOOD (ROUTINE X 2)
Culture: NO GROWTH
Culture: NO GROWTH
Special Requests: ADEQUATE

## 2020-02-03 NOTE — Progress Notes (Signed)
PROGRESS NOTE    Alicia [name]  AOZ:[id number] DOB: 1941-01-10 DOA: 01/30/2020 PCP: Rosita Fire, MD   Brief Narrative:  Alicia Mace Perkinsis a 80 y.o.femalewith medical history significant forperipheral vascular disease peptic ulcer disease, hypertension, hyperlipidemia, renal cancer with mets, depression, COPD, anxiety, and more presents ED with a chief complaint of generalized weakness. Patient was admitted with generalized weakness in the setting of sepsis secondary to pneumonia with significant lactic acidosis. She was also noted protein calorie malnutrition, in the setting of metastatic renal cell carcinoma. Plans were being made to discharge her to inpatient hospice facility given very poor survival expectancy in the coming weeks. Patient does not want any aggressive measures and is DO NOT RESUSCITATE. At this time, she has excepted inpatient comfort measures and hospice facility. Currently, there are no beds available and therefore she will be general inpatientwith hospice comfort care.  Assessment & Plan:   Active Problems:   Severe sepsis (HCC)   Generalized weakness secondary to sepsis with pneumonia  Lactic acidosis secondary to above  Worsening metastatic renal cell carcinoma with failed outpatient chemotherapy  Protein calorie malnutrition  Hypertension  -Currently on comfort measures with plan for discharge toinpatienthospice facility once bed available.   DVT prophylaxis:None Code Status:DNR, Comfort care Family Communication:None at bedside Disposition Plan:Comfort care, awaiting inpatient hospice facility bed   Consultants:  Palliative  Procedures:  None  Antimicrobials:  None  Subjective: Patient seen and evaluated today with no new acute complaints or concerns. No acute concerns or events noted overnight.  Objective: Vitals:   01/31/20 2127 02/01/20 1404 02/02/20 0546 02/02/20 1408  BP: (!) 155/89 (!) 178/89 (!)  151/90 (!) 165/96  Pulse: (!) 129 (!) 119 (!) 121 (!) 129  Resp:  20 20   Temp: 98.9 F (37.2 C) 99.1 F (37.3 C) 98.5 F (36.9 C) 99.5 F (37.5 C)  TempSrc:  Oral  Oral  SpO2: 99% 99% 99% 99%    Intake/Output Summary (Last 24 hours) at 02/03/2020 9629 Last data filed at 02/03/2020 0900 Gross per 24 hour  Intake 360 ml  Output 300 ml  Net 60 ml   There were no vitals filed for this visit.  Examination:  General exam: Appears calm and comfortable, frail/thin Respiratory system: Clear to auscultation. Respiratory effort normal. Cardiovascular system: S1 & S2 heard, RRR.  Gastrointestinal system: Abdomen is soft Central nervous system: Alert and awake Extremities: No edema Skin: No significant lesions noted Psychiatry: Flat affect.    Data Reviewed: I have personally reviewed following labs and imaging studies  CBC: Recent Labs  Lab 01/29/20 0940 01/29/20 1946 01/30/20 0837  WBC 17.1* 17.6* 17.0*  NEUTROABS 14.5* 14.5*  --   HGB 8.6* 7.2* 6.6*  HCT 30.8* 25.7* 24.2*  MCV 77.4* 78.4* 79.1*  PLT 722* 440* 528*   Basic Metabolic Panel: Recent Labs  Lab 01/29/20 0940 01/29/20 1652 01/30/20 0837  NA 137 136 137  K 3.9 3.4* 4.0  CL 97* 100 100  CO2 23 22 23   GLUCOSE 97 158* 92  BUN 17 20 16   CREATININE 0.57 0.63 0.51  CALCIUM 8.9 8.2* 8.3*  MG 1.6*  --  1.3*   GFR: CrCl cannot be calculated (Unknown ideal weight.). Liver Function Tests: Recent Labs  Lab 01/29/20 0940 01/30/20 0837  AST 20 12*  ALT 12 9  ALKPHOS 155* 116  BILITOT 0.4 0.5  PROT 7.5 6.3*  ALBUMIN 2.5* 2.2*   No results for input(s): LIPASE, AMYLASE in the last  168 hours. No results for input(s): AMMONIA in the last 168 hours. Coagulation Profile: Recent Labs  Lab 01/30/20 0837  INR 1.2   Cardiac Enzymes: No results for input(s): CKTOTAL, CKMB, CKMBINDEX, TROPONINI in the last 168 hours. BNP (last 3 results) No results for input(s): PROBNP in the last 8760 hours. HbA1C: No  results for input(s): HGBA1C in the last 72 hours. CBG: No results for input(s): GLUCAP in the last 168 hours. Lipid Profile: No results for input(s): CHOL, HDL, LDLCALC, TRIG, CHOLHDL, LDLDIRECT in the last 72 hours. Thyroid Function Tests: No results for input(s): TSH, T4TOTAL, FREET4, T3FREE, THYROIDAB in the last 72 hours. Anemia Panel: No results for input(s): VITAMINB12, FOLATE, FERRITIN, TIBC, IRON, RETICCTPCT in the last 72 hours. Sepsis Labs: Recent Labs  Lab 01/29/20 1732 01/29/20 1946 01/30/20 0112 01/30/20 0837 01/30/20 1020  PROCALCITON  --  0.21  --  0.20  --   LATICACIDVEN 5.3*  --  5.6* 6.0* 6.0*    Recent Results (from the past 240 hour(s))  Culture, blood (routine x 2)     Status: None (Preliminary result)   Collection Time: 01/29/20  4:52 PM   Specimen: BLOOD  Result Value Ref Range Status   Specimen Description BLOOD  Final   Special Requests NONE  Final   Culture   Final    NO GROWTH 4 DAYS Performed at [facility], [address], [place], [place] [address]    Report Status PENDING  Incomplete  Culture, blood (routine x 2)     Status: None (Preliminary result)   Collection Time: 01/29/20  5:32 PM   Specimen: BLOOD RIGHT ARM  Result Value Ref Range Status   Specimen Description BLOOD RIGHT ARM  Final   Special Requests   Final    BOTTLES DRAWN AEROBIC AND ANAEROBIC Blood Culture adequate volume   Culture   Final    NO GROWTH 4 DAYS Performed at [facility], [address], [place], [place] [address]    Report Status PENDING  Incomplete  SARS CORONAVIRUS 2 (TAT 6-24 HRS) Nasopharyngeal Nasopharyngeal Swab     Status: None   Collection Time: 01/29/20  5:32 PM   Specimen: Nasopharyngeal Swab  Result Value Ref Range Status   SARS Coronavirus 2 NEGATIVE NEGATIVE Final    Comment: (NOTE) SARS-CoV-2 target nucleic acids are NOT DETECTED.  The SARS-CoV-2 RNA is generally detectable in upper and lower respiratory specimens during the acute phase of  infection. Negative results do not preclude SARS-CoV-2 infection, do not rule out co-infections with other pathogens, and should not be used as the sole basis for treatment or other patient management decisions. Negative results must be combined with clinical observations, patient history, and epidemiological information. The expected result is Negative.  Fact Sheet for Patients: SugarRoll.be  Fact Sheet for Healthcare Providers: https://www.woods-mathews.com/  This test is not yet approved or cleared by the [place] FDA and  has been authorized for detection and/or diagnosis of SARS-CoV-2 by FDA under an Emergency Use Authorization (EUA). This EUA will remain  in effect (meaning this test can be used) for the duration of the COVID-19 declaration under Se ction 564(b)(1) of the Act, 21 U.S.C. section 360bbb-3(b)(1), unless the authorization is terminated or revoked sooner.  Performed at [facility], [place] [address] [place], [place] [address]   Urine culture     Status: Abnormal   Collection Time: 01/29/20  7:50 PM   Specimen: Urine, Clean Catch  Result Value Ref Range  Status   Specimen Description   Final    URINE, CLEAN CATCH Performed at [facility], [address] [place], [place] [address]    Special Requests   Final    NONE Performed at [facility], [address], [place], [place] [address]    Culture (A)  Final    <10,000 COLONIES/mL INSIGNIFICANT GROWTH Performed at [place] [address], [place], [place] [address]    Report Status 01/31/2020 FINAL  Final      Radiology Studies: No results found.    LOS: 4 days    Time spent: 35 minutes    Alyscia Carmon Darleen Crocker, DO [facility]  If 7PM-7AM, please contact night-coverage [email or web address] 02/03/2020, 9:58 AM

## 2020-02-04 LAB — RESP PANEL BY RT-PCR (FLU A&B, COVID) ARPGX2
Influenza A by PCR: NEGATIVE
Influenza B by PCR: NEGATIVE
SARS Coronavirus 2 by RT PCR: NEGATIVE

## 2020-02-04 LAB — SARS CORONAVIRUS 2 (TAT 6-24 HRS): SARS Coronavirus 2: NEGATIVE

## 2020-02-04 MED ORDER — ENSURE ENLIVE PO LIQD: 237.0000 mL | Freq: Two times a day (BID) | ORAL | 12 refills | Status: AC

## 2020-02-04 MED ORDER — ENSURE ENLIVE PO LIQD
237.0000 mL | Freq: Two times a day (BID) | ORAL | Status: DC
  Administered 2020-02-04 (×2): 237 mL via ORAL

## 2020-02-04 NOTE — Progress Notes (Signed)
Patient to be discharged to [facility]. Report called and given to [facility]. All questions were answered and no further questions at this time. Patient in stable condition and in no acute distress at time of discharge. Patient will be transported via [facility].

## 2020-02-04 NOTE — Progress Notes (Signed)
PROGRESS NOTE    Alicia [name]  OVF:[id number] DOB: 1940-01-28 DOA: 01/30/2020 PCP: Rosita Fire, MD   Brief Narrative:  Alicia Mace Perkinsis a 80 y.o.femalewith medical history significant forperipheral vascular disease peptic ulcer disease, hypertension, hyperlipidemia, renal cancer with mets, depression, COPD, anxiety, and more presents ED with a chief complaint of generalized weakness. Patient was admitted with generalized weakness in the setting of sepsis secondary to pneumonia with significant lactic acidosis. She was also noted protein calorie malnutrition, in the setting of metastatic renal cell carcinoma. Plans were being made to discharge her to inpatient hospice facility given very poor survival expectancy in the coming weeks. Patient does not want any aggressive measures and is DO NOT RESUSCITATE. At this time, she has excepted inpatient comfort measures and hospice facility. Currently, there are no beds available and therefore she will be general inpatientwith hospice comfort care.  Assessment & Plan:   Active Problems:   Severe sepsis (HCC)   Generalized weakness secondary to sepsis with pneumonia  Lactic acidosis secondary to above  Worsening metastatic renal cell carcinoma with failed outpatient chemotherapy  Protein calorie malnutrition  Hypertension  -Currently on comfort measures with plan for discharge toinpatienthospice facility once bed available.   DVT prophylaxis:None Code Status:DNR, Comfort care Family Communication:None at bedside Disposition Plan:Comfort care, awaiting inpatient hospice facility bed   Consultants:  Palliative  Procedures:  None  Antimicrobials:  None  Subjective: Patient seen and evaluated today with no new acute complaints or concerns. No acute concerns or events noted overnight. She continues to have some mild shoulder pain and would like some Ensure.  Objective: Vitals:   02/02/20 0546  02/02/20 1408 02/03/20 1419 02/03/20 1700  BP: (!) 151/90 (!) 165/96 (!) 175/104 (!) 156/93  Pulse: (!) 121 (!) 129 (!) 126   Resp: 20     Temp: 98.5 F (36.9 C) 99.5 F (37.5 C) 99.1 F (37.3 C) 99.1 F (37.3 C)  TempSrc:  Oral Oral Oral  SpO2: 99% 99% 100% 95%    Intake/Output Summary (Last 24 hours) at 02/04/2020 1053 Last data filed at 02/03/2020 1700 Gross per 24 hour  Intake 480 ml  Output -  Net 480 ml   There were no vitals filed for this visit.  Examination:  General exam: Appears calm and comfortable, thin/frail Respiratory system: Clear to auscultation. Respiratory effort normal. Cardiovascular system: S1 & S2 heard, RRR.  Gastrointestinal system: Abdomen is soft Central nervous system: Alert and awake Extremities: No edema Skin: No significant lesions noted Psychiatry: Flat affect.    Data Reviewed: I have personally reviewed following labs and imaging studies  CBC: Recent Labs  Lab 01/29/20 0940 01/29/20 1946 01/30/20 0837  WBC 17.1* 17.6* 17.0*  NEUTROABS 14.5* 14.5*  --   HGB 8.6* 7.2* 6.6*  HCT 30.8* 25.7* 24.2*  MCV 77.4* 78.4* 79.1*  PLT 722* 440* 841*   Basic Metabolic Panel: Recent Labs  Lab 01/29/20 0940 01/29/20 1652 01/30/20 0837  NA 137 136 137  K 3.9 3.4* 4.0  CL 97* 100 100  CO2 23 22 23   GLUCOSE 97 158* 92  BUN 17 20 16   CREATININE 0.57 0.63 0.51  CALCIUM 8.9 8.2* 8.3*  MG 1.6*  --  1.3*   GFR: CrCl cannot be calculated (Unknown ideal weight.). Liver Function Tests: Recent Labs  Lab 01/29/20 0940 01/30/20 0837  AST 20 12*  ALT 12 9  ALKPHOS 155* 116  BILITOT 0.4 0.5  PROT 7.5 6.3*  ALBUMIN 2.5* 2.2*  No results for input(s): LIPASE, AMYLASE in the last 168 hours. No results for input(s): AMMONIA in the last 168 hours. Coagulation Profile: Recent Labs  Lab 01/30/20 0837  INR 1.2   Cardiac Enzymes: No results for input(s): CKTOTAL, CKMB, CKMBINDEX, TROPONINI in the last 168 hours. BNP (last 3  results) No results for input(s): PROBNP in the last 8760 hours. HbA1C: No results for input(s): HGBA1C in the last 72 hours. CBG: No results for input(s): GLUCAP in the last 168 hours. Lipid Profile: No results for input(s): CHOL, HDL, LDLCALC, TRIG, CHOLHDL, LDLDIRECT in the last 72 hours. Thyroid Function Tests: No results for input(s): TSH, T4TOTAL, FREET4, T3FREE, THYROIDAB in the last 72 hours. Anemia Panel: No results for input(s): VITAMINB12, FOLATE, FERRITIN, TIBC, IRON, RETICCTPCT in the last 72 hours. Sepsis Labs: Recent Labs  Lab 01/29/20 1732 01/29/20 1946 01/30/20 0112 01/30/20 0837 01/30/20 1020  PROCALCITON  --  0.21  --  0.20  --   LATICACIDVEN 5.3*  --  5.6* 6.0* 6.0*    Recent Results (from the past 240 hour(s))  Culture, blood (routine x 2)     Status: None   Collection Time: 01/29/20  4:52 PM   Specimen: BLOOD  Result Value Ref Range Status   Specimen Description BLOOD  Final   Special Requests NONE  Final   Culture   Final    NO GROWTH 5 DAYS Performed at [facility], [address] [place], [place] [address]    Report Status 02/03/2020 FINAL  Final  Culture, blood (routine x 2)     Status: None   Collection Time: 01/29/20  5:32 PM   Specimen: BLOOD RIGHT ARM  Result Value Ref Range Status   Specimen Description BLOOD RIGHT ARM  Final   Special Requests   Final    BOTTLES DRAWN AEROBIC AND ANAEROBIC Blood Culture adequate volume   Culture   Final    NO GROWTH 5 DAYS Performed at [facility], [address] [place], [place] [address]    Report Status 02/03/2020 FINAL  Final  SARS CORONAVIRUS 2 (TAT 6-24 HRS) Nasopharyngeal Nasopharyngeal Swab     Status: None   Collection Time: 01/29/20  5:32 PM   Specimen: Nasopharyngeal Swab  Result Value Ref Range Status   SARS Coronavirus 2 NEGATIVE NEGATIVE Final    Comment: (NOTE) SARS-CoV-2 target nucleic acids are NOT DETECTED.  The SARS-CoV-2 RNA is generally detectable in upper and  lower respiratory specimens during the acute phase of infection. Negative results do not preclude SARS-CoV-2 infection, do not rule out co-infections with other pathogens, and should not be used as the sole basis for treatment or other patient management decisions. Negative results must be combined with clinical observations, patient history, and epidemiological information. The expected result is Negative.  Fact Sheet for Patients: SugarRoll.be  Fact Sheet for Healthcare Providers: https://www.woods-mathews.com/  This test is not yet approved or cleared by the [place] FDA and  has been authorized for detection and/or diagnosis of SARS-CoV-2 by FDA under an Emergency Use Authorization (EUA). This EUA will remain  in effect (meaning this test can be used) for the duration of the COVID-19 declaration under Se ction 564(b)(1) of the Act, 21 U.S.C. section 360bbb-3(b)(1), unless the authorization is terminated or revoked sooner.  Performed at [facility], [place] [address], [place], [place] [address]   Urine culture     Status: Abnormal   Collection Time: 01/29/20  7:50 PM   Specimen: Urine,  Clean Catch  Result Value Ref Range Status   Specimen Description   Final    URINE, CLEAN CATCH Performed at [facility], [address] [place], [place] [address]    Special Requests   Final    NONE Performed at [facility], [address], [place], [place] [address]    Culture (A)  Final    <10,000 COLONIES/mL INSIGNIFICANT GROWTH Performed at [place] [address], [place], [place] [address]    Report Status 01/31/2020 FINAL  Final      Radiology Studies: No results found.    Scheduled Meds: . feeding supplement  237 mL Oral BID BM    LOS: 5 days    Time spent: 35 minutes    [place] Darleen Crocker, DO [facility]  If 7PM-7AM, please contact night-coverage [email or web address] 02/04/2020, 10:53 AM

## 2020-02-04 NOTE — Discharge Summary (Signed)
Physician Discharge Summary  Alicia [name] XIP:[id number] DOB: 05-29-1940 DOA: 01/30/2020  PCP: Rosita Fire, MD  Admit date: 01/30/2020  Discharge date: 02/04/2020  Admitted From:Home  Disposition:  Hospice facility  Recommendations for Outpatient Follow-up:  1. Continue comfort care at hospice facility  Home Health:None  Equipment/Devices:None  Discharge Condition:Stable  CODE STATUS: DNR/Comfort care  Diet recommendation: Regular  Brief/Interim Summary: [name] Perkinsis a 80 y.o.femalewith medical history significant forperipheral vascular disease peptic ulcer disease, hypertension, hyperlipidemia, renal cancer with mets, depression, COPD, anxiety, and more presents ED with a chief complaint of generalized weakness. Patient was admitted with generalized weakness in the setting of sepsis secondary to pneumonia with significant lactic acidosis. She was also noted protein calorie malnutrition, in the setting of metastatic renal cell carcinoma. Plans were being made to discharge her to inpatient hospice facility given very poor survival expectancy in the coming weeks. Patient does not want any aggressive measures and is DO NOT RESUSCITATE. At this time, she has excepted inpatient comfort measures and hospice facility. She now has a bed available and is stable for DC. No other acute events noted this admission.  Discharge Diagnoses:  Active Problems:   Severe sepsis (HCC)  Generalized weakness secondary to sepsis with pneumonia  Lactic acidosis secondary to above  Worsening metastatic renal cell carcinoma with failed outpatient chemotherapy  Protein calorie malnutrition  Hypertension  Discharge Instructions  Discharge Instructions    Diet - low sodium heart healthy   Complete by: As directed    Increase activity slowly   Complete by: As directed      Allergies as of 02/04/2020      Reactions   Aspirin Other (See Comments)   stomach upset      Medication  List    STOP taking these medications   amLODipine 10 MG tablet Commonly known as: NORVASC   amoxicillin-clavulanate 875-125 MG tablet Commonly known as: AUGMENTIN   fentaNYL 25 MCG/HR Commonly known as: DURAGESIC   HYDROcodone-acetaminophen 5-325 MG tablet Commonly known as: NORCO/VICODIN   losartan 50 MG tablet Commonly known as: COZAAR   magnesium oxide 400 (241.3 Mg) MG tablet Commonly known as: MAG-OX   mirtazapine 15 MG tablet Commonly known as: REMERON   OPDIVO IV   oxyCODONE 5 MG immediate release tablet Commonly known as: Oxy IR/ROXICODONE   risedronate 35 MG tablet Commonly known as: ACTONEL   simvastatin 20 MG tablet Commonly known as: ZOCOR   YERVOY IV     TAKE these medications   albuterol 108 (90 Base) MCG/ACT inhaler Commonly known as: VENTOLIN HFA Inhale 2 puffs into the lungs every 4 (four) hours as needed for wheezing or shortness of breath.   cilostazol 100 MG tablet Commonly known as: PLETAL Take 100 mg by mouth 2 (two) times daily.   feeding supplement Liqd Take 237 mLs by mouth 2 (two) times daily between meals. What changed: when to take this   fluticasone 50 MCG/ACT nasal spray Commonly known as: FLONASE Place 1 spray into both nostrils daily.   gabapentin 100 MG capsule Commonly known as: NEURONTIN Take 100 mg by mouth 2 (two) times daily.   oxybutynin 10 MG 24 hr tablet Commonly known as: DITROPAN-XL Take 10 mg by mouth at bedtime.       Allergies  Allergen Reactions  . Aspirin Other (See Comments)    stomach upset    Consultations:  Palliative Care   Procedures/Studies: DG Chest Port 1 View  Result Date: 01/29/2020 CLINICAL DATA:  Weakness EXAM: PORTABLE CHEST 1 VIEW COMPARISON:  10/11/2019 radiograph and chest CT, PET CT 10/01/2019 FINDINGS: Possible tiny right pleural effusion. Increased bibasilar opacity. Nodular appearance of pulmonary parenchyma. Stable cardiomediastinal silhouette with aortic  atherosclerosis. No pneumothorax. Chronic appearing rib fractures. Stable cystic lucency at the right glenoid. Known osseous metastatic disease better seen on CT. IMPRESSION: Possible tiny right pleural effusion. Increased bibasilar opacities, either reflecting increased pulmonary metastatic disease versus mild pneumonia. Diffuse nodular appearance of pulmonary parenchyma, felt to correspond to CT demonstrated metastatic lung nodules. Electronically Signed   By: Donavan Foil M.D.   On: 01/29/2020 17:57     Discharge Exam: Vitals:   02/03/20 1419 02/03/20 1700  BP: (!) 175/104 (!) 156/93  Pulse: (!) 126   Resp:    Temp: 99.1 F (37.3 C) 99.1 F (37.3 C)  SpO2: 100% 95%   Vitals:   02/02/20 0546 02/02/20 1408 02/03/20 1419 02/03/20 1700  BP: (!) 151/90 (!) 165/96 (!) 175/104 (!) 156/93  Pulse: (!) 121 (!) 129 (!) 126   Resp: 20     Temp: 98.5 F (36.9 C) 99.5 F (37.5 C) 99.1 F (37.3 C) 99.1 F (37.3 C)  TempSrc:  Oral Oral Oral  SpO2: 99% 99% 100% 95%    General: Pt is alert, awake, not in acute distress, frail Cardiovascular: RRR, S1/S2 +, no rubs, no gallops Respiratory: CTA bilaterally, no wheezing, no rhonchi Abdominal: Soft, NT, ND, bowel sounds + Extremities: no edema, no cyanosis    The results of significant diagnostics from this hospitalization (including imaging, microbiology, ancillary and laboratory) are listed below for reference.     Microbiology: Recent Results (from the past 240 hour(s))  Culture, blood (routine x 2)     Status: None   Collection Time: 01/29/20  4:52 PM   Specimen: BLOOD  Result Value Ref Range Status   Specimen Description BLOOD  Final   Special Requests NONE  Final   Culture   Final    NO GROWTH 5 DAYS Performed at [facility], [address] [place], [place] [address]    Report Status 02/03/2020 FINAL  Final  Culture, blood (routine x 2)     Status: None   Collection Time: 01/29/20  5:32 PM   Specimen: BLOOD RIGHT ARM   Result Value Ref Range Status   Specimen Description BLOOD RIGHT ARM  Final   Special Requests   Final    BOTTLES DRAWN AEROBIC AND ANAEROBIC Blood Culture adequate volume   Culture   Final    NO GROWTH 5 DAYS Performed at [facility], [address] [place], [place] [address]    Report Status 02/03/2020 FINAL  Final  SARS CORONAVIRUS 2 (TAT 6-24 HRS) Nasopharyngeal Nasopharyngeal Swab     Status: None   Collection Time: 01/29/20  5:32 PM   Specimen: Nasopharyngeal Swab  Result Value Ref Range Status   SARS Coronavirus 2 NEGATIVE NEGATIVE Final    Comment: (NOTE) SARS-CoV-2 target nucleic acids are NOT DETECTED.  The SARS-CoV-2 RNA is generally detectable in upper and lower respiratory specimens during the acute phase of infection. Negative results do not preclude SARS-CoV-2 infection, do not rule out co-infections with other pathogens, and should not be used as the sole basis for treatment or other patient management decisions. Negative results must be combined with clinical observations, patient history, and epidemiological information. The expected result is Negative.  Fact Sheet for Patients: SugarRoll.be  Fact Sheet for Healthcare Providers: https://www.woods-mathews.com/  This test is  not yet approved or cleared by the [place] and  has been authorized for detection and/or diagnosis of SARS-CoV-2 by FDA under an Emergency Use Authorization (EUA). This EUA will remain  in effect (meaning this test can be used) for the duration of the COVID-19 declaration under Se ction 564(b)(1) of the Act, 21 U.S.C. section 360bbb-3(b)(1), unless the authorization is terminated or revoked sooner.  Performed at [facility], [place] [address] [place], [place] [address]   Urine culture     Status: Abnormal   Collection Time: 01/29/20  7:50 PM   Specimen: Urine, Clean Catch  Result Value Ref Range Status   Specimen Description    Final    URINE, CLEAN CATCH Performed at [facility], [address] [place], [place] [address]    Special Requests   Final    NONE Performed at [facility], [address] [place], [place] [address]    Culture (A)  Final    <10,000 COLONIES/mL INSIGNIFICANT GROWTH Performed at [place] [address], [place], [place] [address]    Report Status 01/31/2020 FINAL  Final     Labs: BNP (last 3 results) No results for input(s): BNP in the last 8760 hours. Basic Metabolic Panel: Recent Labs  Lab 01/29/20 0940 01/29/20 1652 01/30/20 0837  NA 137 136 137  K 3.9 3.4* 4.0  CL 97* 100 100  CO2 23 22 23   GLUCOSE 97 158* 92  BUN 17 20 16   CREATININE 0.57 0.63 0.51  CALCIUM 8.9 8.2* 8.3*  MG 1.6*  --  1.3*   Liver Function Tests: Recent Labs  Lab 01/29/20 0940 01/30/20 0837  AST 20 12*  ALT 12 9  ALKPHOS 155* 116  BILITOT 0.4 0.5  PROT 7.5 6.3*  ALBUMIN 2.5* 2.2*   No results for input(s): LIPASE, AMYLASE in the last 168 hours. No results for input(s): AMMONIA in the last 168 hours. CBC: Recent Labs  Lab 01/29/20 0940 01/29/20 1946 01/30/20 0837  WBC 17.1* 17.6* 17.0*  NEUTROABS 14.5* 14.5*  --   HGB 8.6* 7.2* 6.6*  HCT 30.8* 25.7* 24.2*  MCV 77.4* 78.4* 79.1*  PLT 722* 440* 572*   Cardiac Enzymes: No results for input(s): CKTOTAL, CKMB, CKMBINDEX, TROPONINI in the last 168 hours. BNP: Invalid input(s): POCBNP CBG: No results for input(s): GLUCAP in the last 168 hours. D-Dimer No results for input(s): DDIMER in the last 72 hours. Hgb A1c No results for input(s): HGBA1C in the last 72 hours. Lipid Profile No results for input(s): CHOL, HDL, LDLCALC, TRIG, CHOLHDL, LDLDIRECT in the last 72 hours. Thyroid function studies No results for input(s): TSH, T4TOTAL, T3FREE, THYROIDAB in the last 72 hours.  Invalid input(s): FREET3 Anemia work up No results for input(s): VITAMINB12, FOLATE, FERRITIN, TIBC, IRON, RETICCTPCT in the last 72  hours. Urinalysis    Component Value Date/Time   COLORURINE YELLOW 01/29/2020 1950   APPEARANCEUR HAZY (A) 01/29/2020 1950   APPEARANCEUR Hazy (A) 10/23/2019 1453   LABSPEC 1.016 01/29/2020 1950   PHURINE 5.0 01/29/2020 1950   GLUCOSEU NEGATIVE 01/29/2020 1950   HGBUR NEGATIVE 01/29/2020 1950   HGBUR small 12/05/2006 [place] 01/29/2020 1950   BILIRUBINUR Negative 10/23/2019 [place] 01/29/2020 1950   PROTEINUR 30 (A) 01/29/2020 1950   UROBILINOGEN 0.2 07/23/2010 2045   NITRITE NEGATIVE 01/29/2020 1950   LEUKOCYTESUR NEGATIVE 01/29/2020 1950   Sepsis Labs Invalid input(s): PROCALCITONIN,  WBC,  LACTICIDVEN  Microbiology Recent Results (from the past 240 hour(s))  Culture, blood (routine x 2)     Status: None   Collection Time: 01/29/20  4:52 PM   Specimen: BLOOD  Result Value Ref Range Status   Specimen Description BLOOD  Final   Special Requests NONE  Final   Culture   Final    NO GROWTH 5 DAYS Performed at [facility], [address] [place], [place] [address]    Report Status 02/03/2020 FINAL  Final  Culture, blood (routine x 2)     Status: None   Collection Time: 01/29/20  5:32 PM   Specimen: BLOOD RIGHT ARM  Result Value Ref Range Status   Specimen Description BLOOD RIGHT ARM  Final   Special Requests   Final    BOTTLES DRAWN AEROBIC AND ANAEROBIC Blood Culture adequate volume   Culture   Final    NO GROWTH 5 DAYS Performed at [facility], [address], [place], [place] [address]    Report Status 02/03/2020 FINAL  Final  SARS CORONAVIRUS 2 (TAT 6-24 HRS) Nasopharyngeal Nasopharyngeal Swab     Status: None   Collection Time: 01/29/20  5:32 PM   Specimen: Nasopharyngeal Swab  Result Value Ref Range Status   SARS Coronavirus 2 NEGATIVE NEGATIVE Final    Comment: (NOTE) SARS-CoV-2 target nucleic acids are NOT DETECTED.  The SARS-CoV-2 RNA is generally detectable in upper and lower respiratory specimens during the acute  phase of infection. Negative results do not preclude SARS-CoV-2 infection, do not rule out co-infections with other pathogens, and should not be used as the sole basis for treatment or other patient management decisions. Negative results must be combined with clinical observations, patient history, and epidemiological information. The expected result is Negative.  Fact Sheet for Patients: SugarRoll.be  Fact Sheet for Healthcare Providers: https://www.woods-mathews.com/  This test is not yet approved or cleared by the [place] FDA and  has been authorized for detection and/or diagnosis of SARS-CoV-2 by FDA under an Emergency Use Authorization (EUA). This EUA will remain  in effect (meaning this test can be used) for the duration of the COVID-19 declaration under Se ction 564(b)(1) of the Act, 21 U.S.C. section 360bbb-3(b)(1), unless the authorization is terminated or revoked sooner.  Performed at [facility], [place] [address] [place], [place] [address]   Urine culture     Status: Abnormal   Collection Time: 01/29/20  7:50 PM   Specimen: Urine, Clean Catch  Result Value Ref Range Status   Specimen Description   Final    URINE, CLEAN CATCH Performed at [facility], [address] [place], [place] [address]    Special Requests   Final    NONE Performed at [facility], [address] [place], [place] [address]    Culture (A)  Final    <10,000 COLONIES/mL INSIGNIFICANT GROWTH Performed at [facility] [address] [place],  [address]    Report Status 01/31/2020 FINAL  Final     Time coordinating discharge: 35 minutes  SIGNED:   Rodena Goldmann, DO [facility] 02/04/2020, 11:28 AM  If 7PM-7AM, please contact night-coverage [email or web address]

## 2020-02-04 NOTE — TOC Transition Note (Signed)
Transition of Care [facility]) - CM/SW Discharge Note   Patient Details  Name: Alicia [name] MRN: [id number] Date of Birth: June 28, 1940  [facility]) CM/SW Contact:  Natasha Bence, LCSW Phone Number: 02/04/2020, 1:59 PM   Clinical Narrative:    Vito Backers with [facility] confirmed bed availability. [facility] agreeable to take patient. CSW confirmed negative Covid test results. CSW completed med necessity and Contacted EMS. TOC signing off.    Final next level of care: Home w Hospice Care Barriers to Discharge: Barriers Resolved   Patient Goals and CMS Choice Patient states their goals for this hospitalization and ongoing recovery are:: Hospice CMS Medicare.gov Compare Post Acute Care list provided to:: Patient Choice offered to / list presented to : Patient  Discharge Placement                Patient to be transferred to facility by: [facility] Name of family member notified: Ramond Craver Patient and family notified of of transfer: 02/04/20  Discharge Plan and Services In-house Referral: Clinical Social Work Discharge Planning Services: NA Post Acute Care Choice: Hospice          DME Arranged: N/A DME Agency: NA       HH Arranged: NA [place] Agency: NA        Social Determinants of Health (SDOH) Interventions     Readmission Risk Interventions Readmission Risk Prevention Plan 01/30/2020  Transportation Screening Complete  PCP or Specialist Appt within 3-5 Days Not Complete  Not Complete comments Patient being referred for residential hospice  Social Work Consult for [place] Planning/Counseling Complete  Palliative Care Screening Not Complete  Palliative Care Screening Not Complete Comments Palliative not available today; patient being referred to residential hospice  Medication Review (RN Transport planner) Complete  Some recent data might be hidden

## 2020-02-19 DEATH — deceased

## 2020-02-29 ENCOUNTER — Encounter (HOSPITAL_COMMUNITY): Payer: Medicare HMO

## 2020-10-04 IMAGING — DX DG FEMUR 2+V*R*
4 series · 4 of 4 positions shown · non-contrast
Comparison: 05/14/2019, 10/11/2019

CLINICAL DATA: Recent injury, hip pain

EXAM:
RIGHT FEMUR 2 VIEWS

[femur ap proximal (1 of 2)]
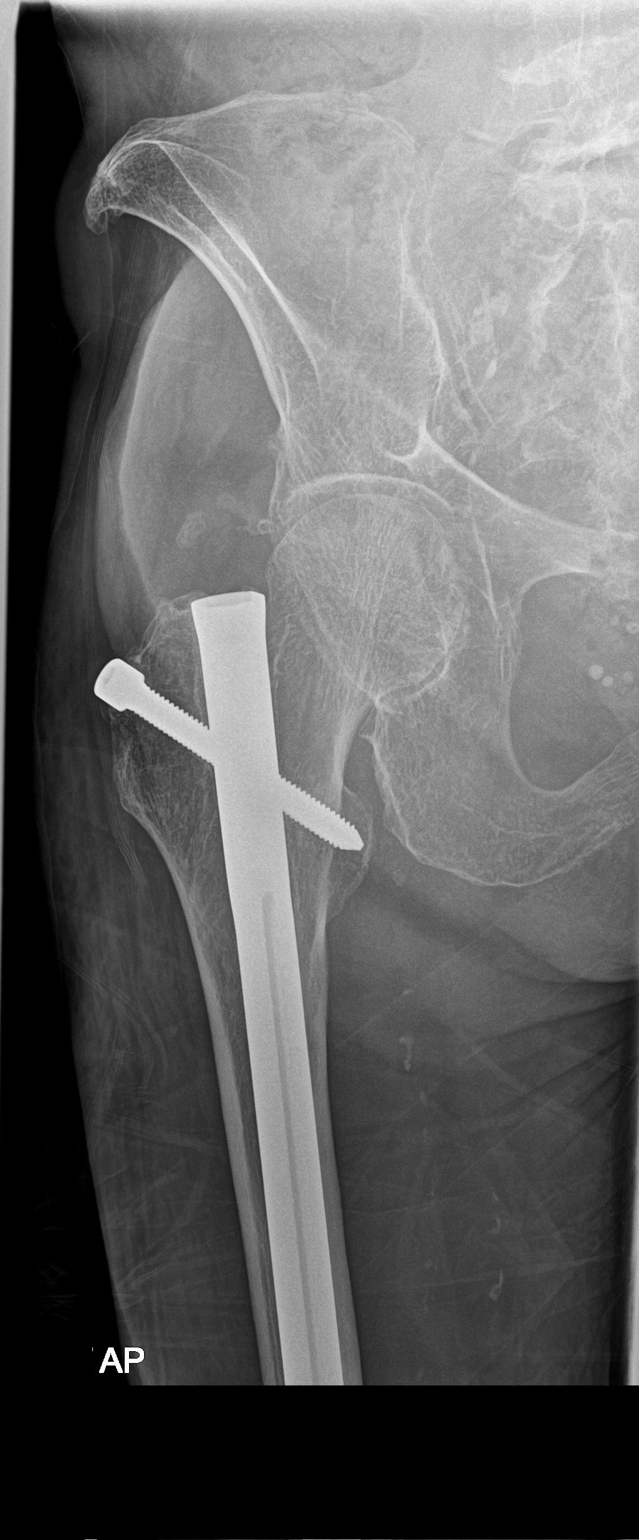

[femur ap proximal (2 of 2)]
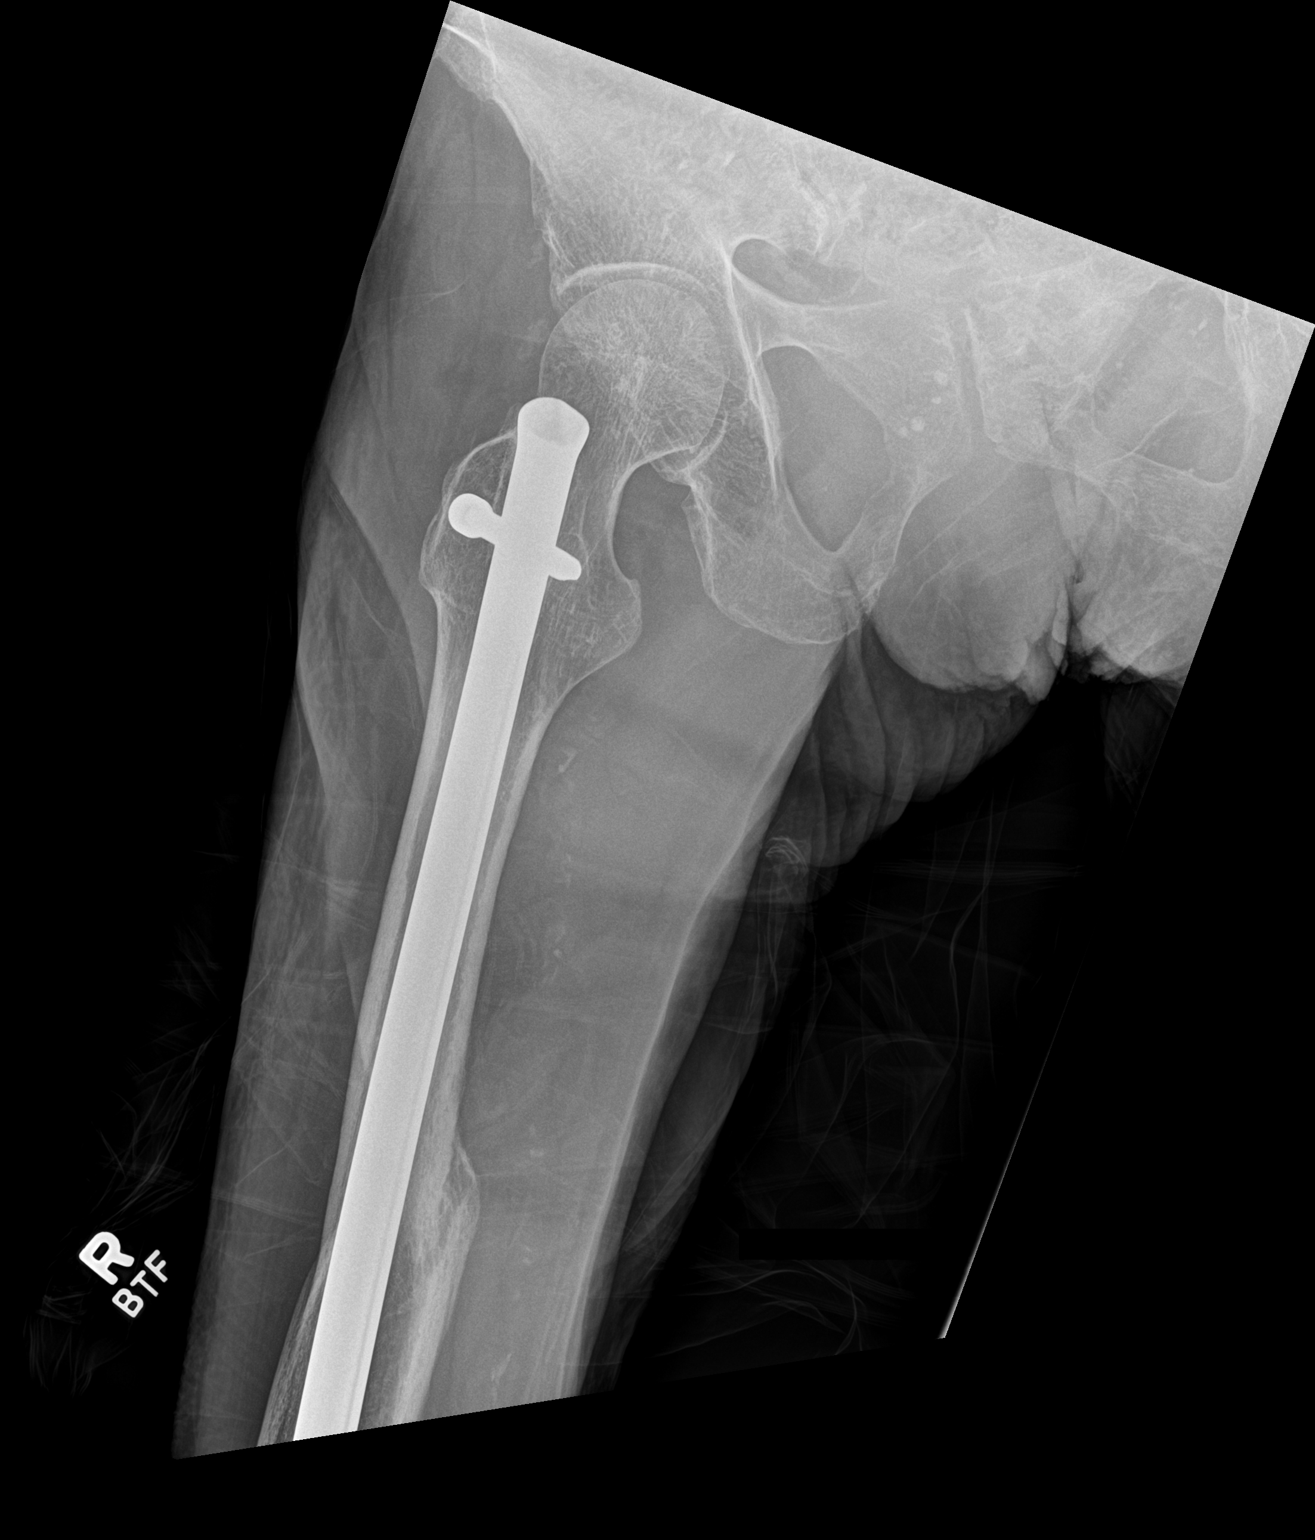

[femur lat distal (1 of 2)]
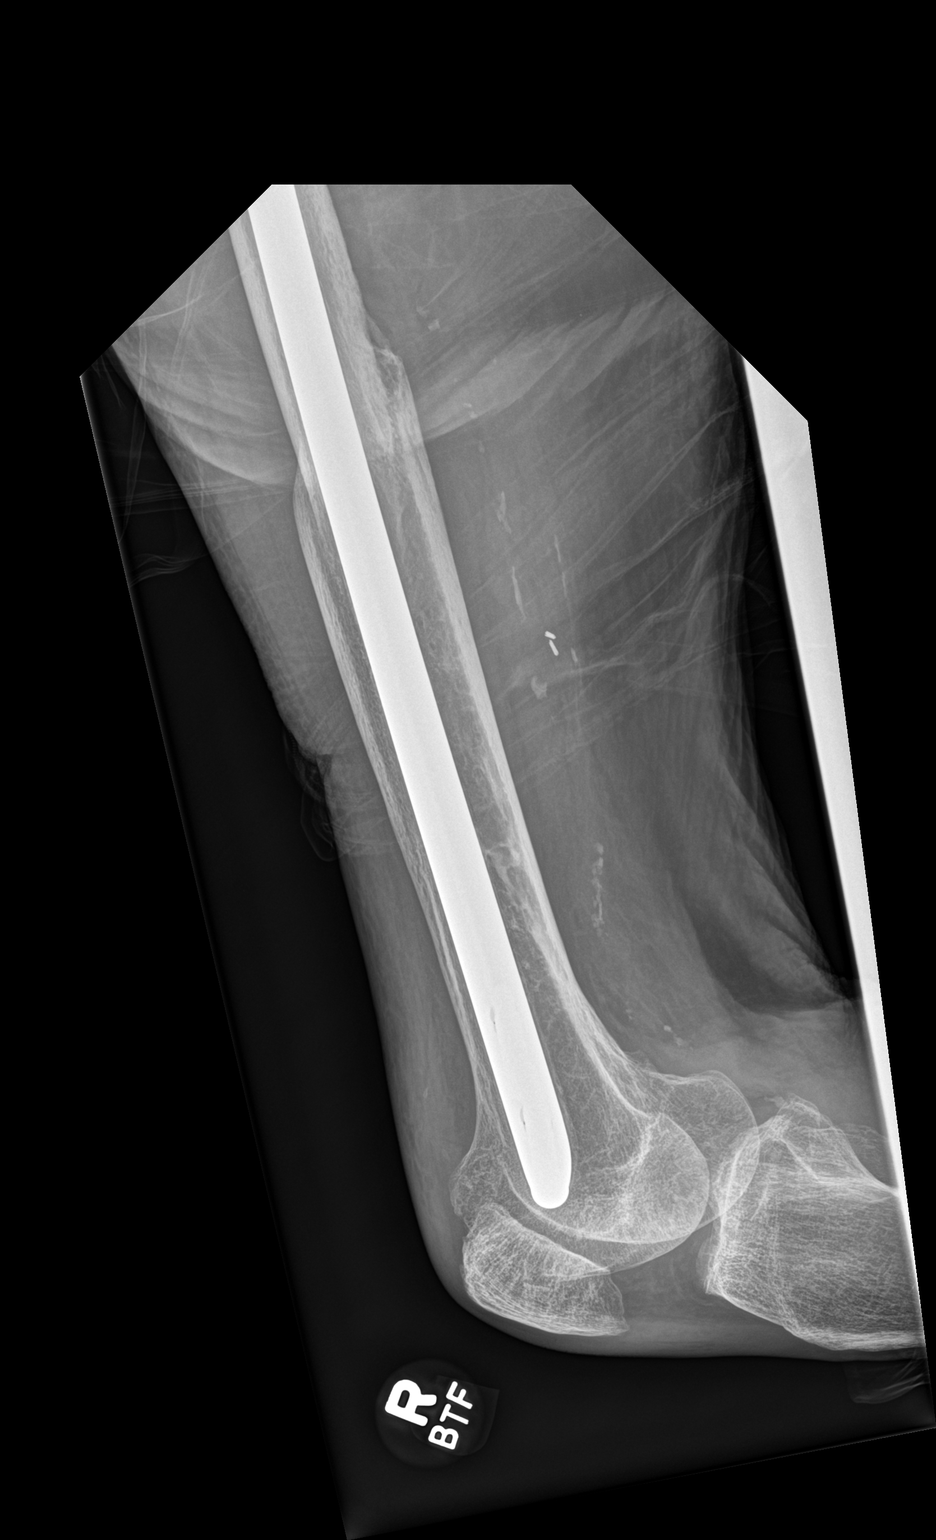

[femur lat distal (2 of 2)]
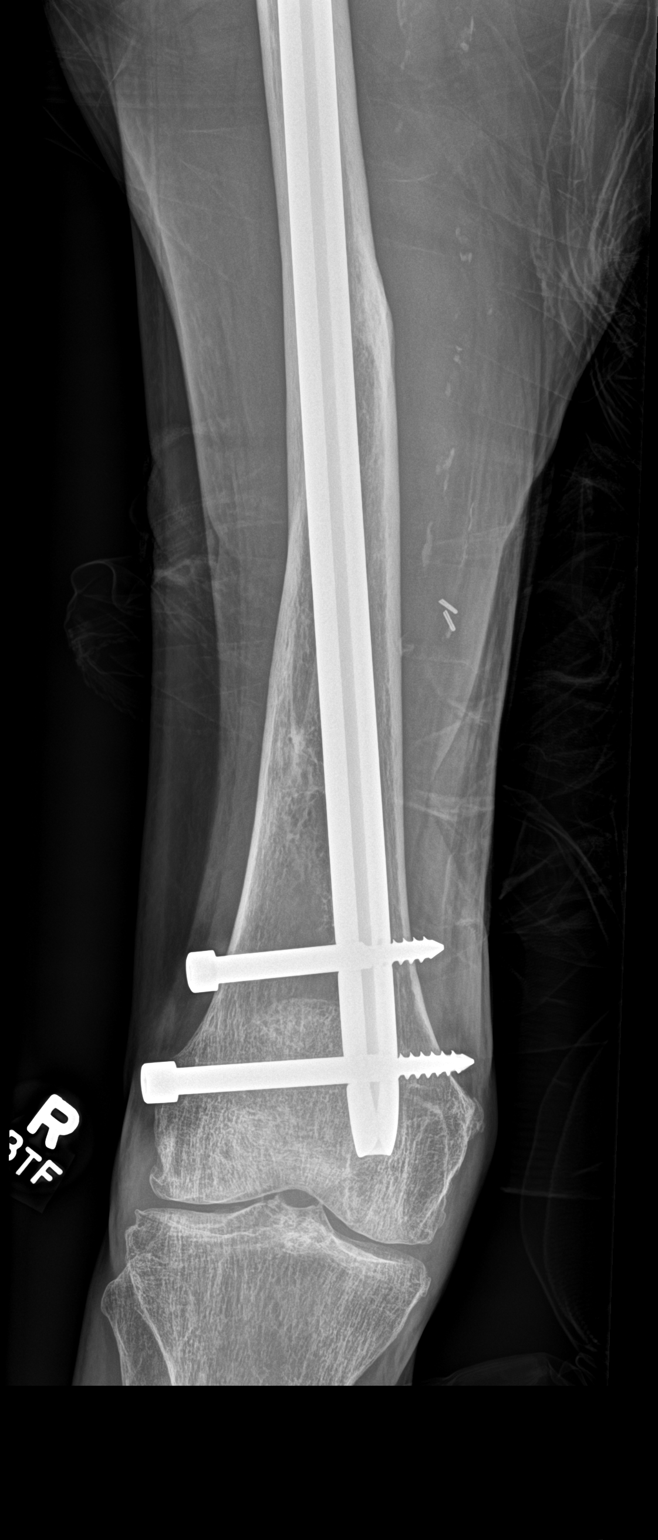

[4 of 4 positions shown; findings below may reference images not displayed]

FINDINGS: Marked osteopenia. Previous right femur intramedullary rod with
proximal distal fixation screws. Similar healed fracture of the
right mid femur shaft. Right hip appears intact without displaced
fracture or malalignment. Visualized right pelvis intact.
Degenerative arthritis of the right knee. Peripheral atherosclerosis
noted. No focal soft tissue abnormality.
IMPRESSION: Previous right femur IM rod fixation for remote healed mid femur
fracture.

Osteopenia

No acute displaced fracture by plain radiography

## 2020-10-04 IMAGING — CT CT ANGIO CHEST
2 of 6 series · 18 of 46 positions shown · IV contrast (Omnipaque or Isovue)
Comparison: PET-CT 10/01/2019 and previous

CLINICAL DATA: Pain, tachycardia, renal mass, adenopathy, weight
loss

EXAM:
CT ANGIOGRAPHY CHEST WITH CONTRAST
TECHNIQUE: Multidetector CT imaging of the chest was performed using the
standard protocol during bolus administration of intravenous
contrast. Multiplanar CT image reconstructions and MIPs were
obtained to evaluate the vascular anatomy.
CONTRAST:  60mL OMNIPAQUE IOHEXOL 350 MG/ML SOLN

[Series 5: pe axial thins · axial · 0.54mm/px · z∈[-408,-197]mm · 15 of 233 slices shown]
[im 11/233  lung]
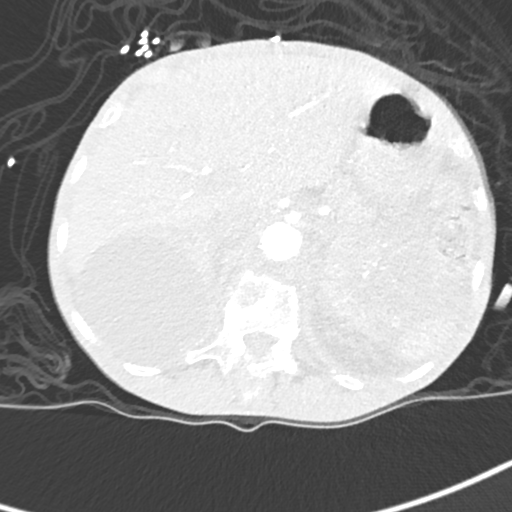
[im 31/233  soft-tissue]
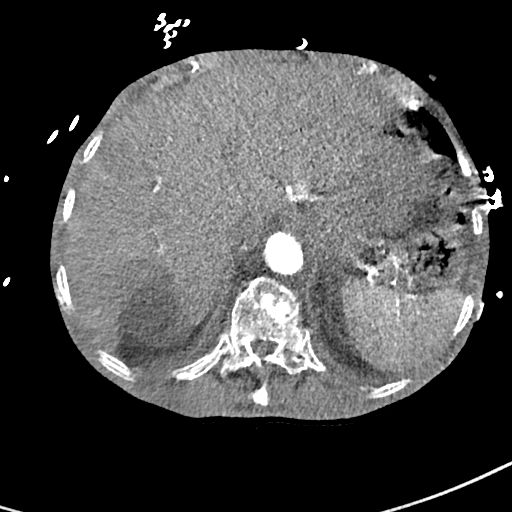
[im 41/233  lung]
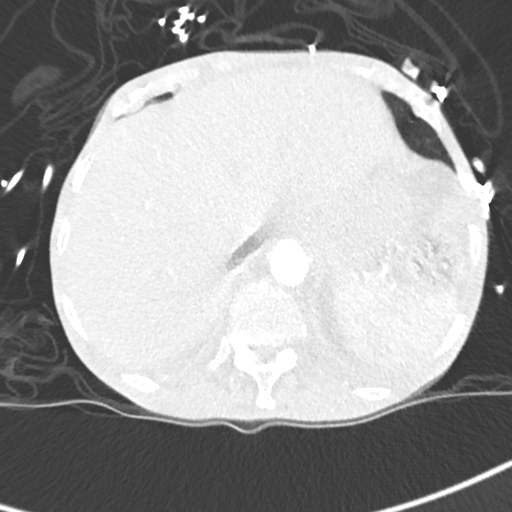
[im 61/233  soft-tissue]
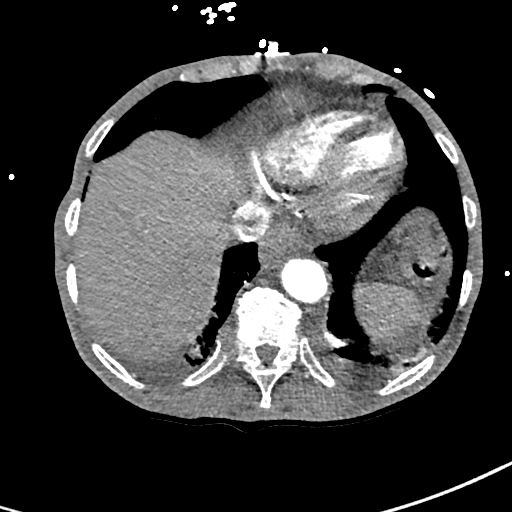
[im 71/233  lung]
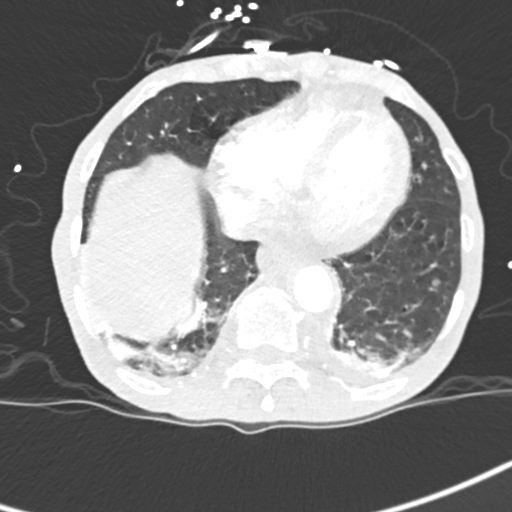
[im 91/233  soft-tissue]
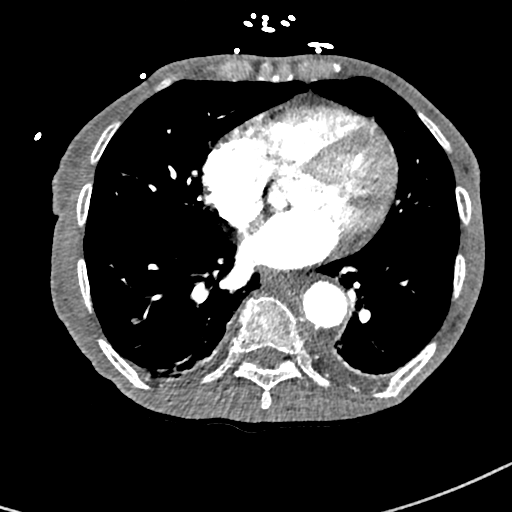
[im 101/233  lung]
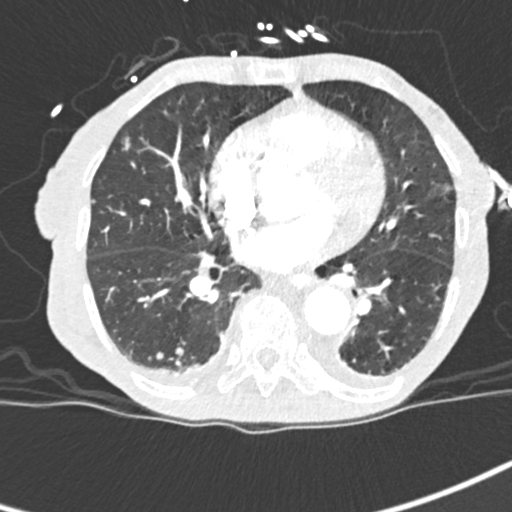
[im 122/233  soft-tissue]
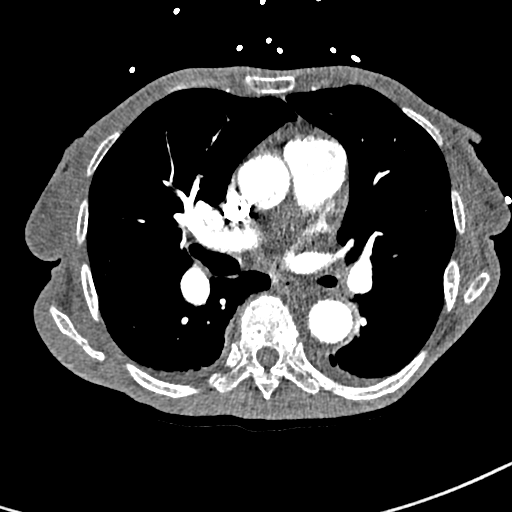
[im 132/233  lung]
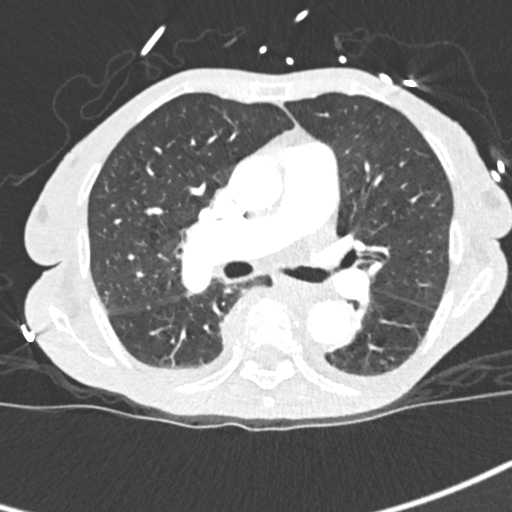
[im 142/233  soft-tissue]
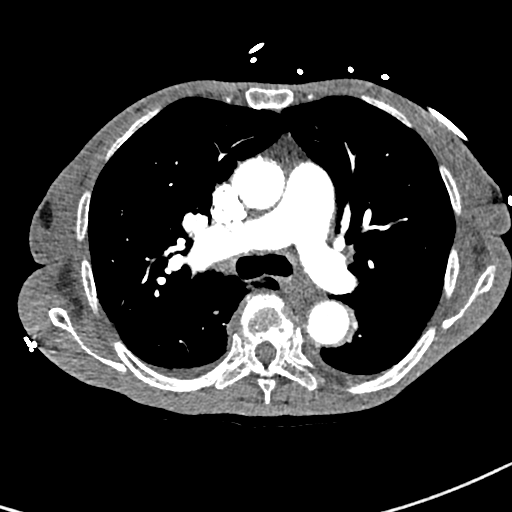
[im 162/233  lung]
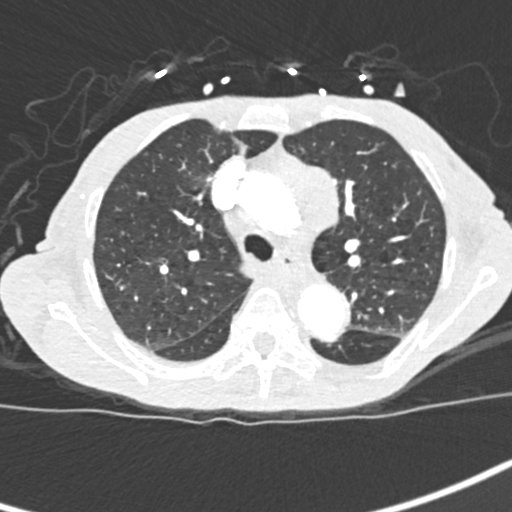
[im 172/233  soft-tissue]
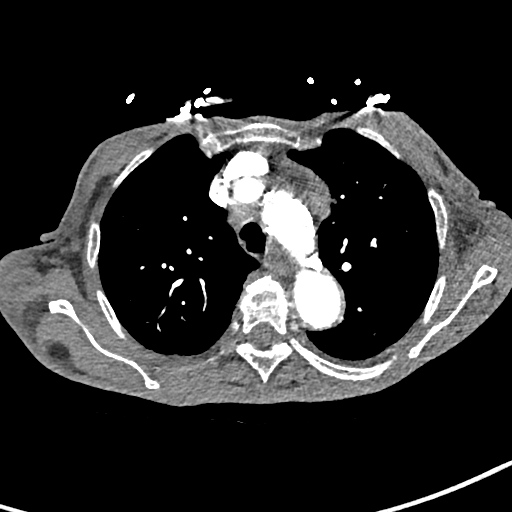
[im 192/233  lung]
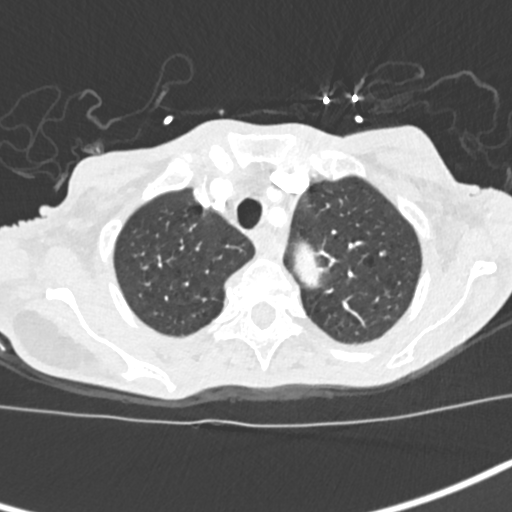
[im 202/233  soft-tissue]
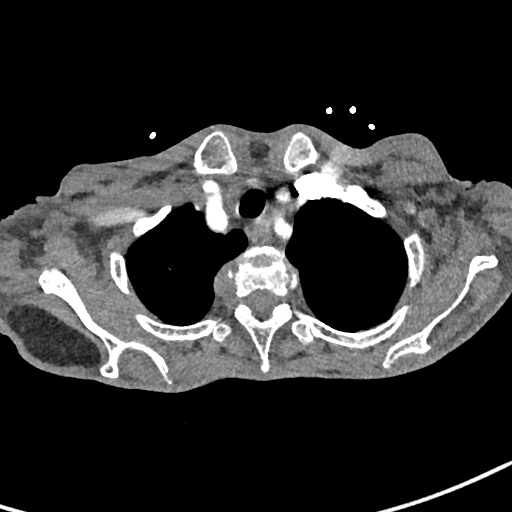
[im 222/233  lung]
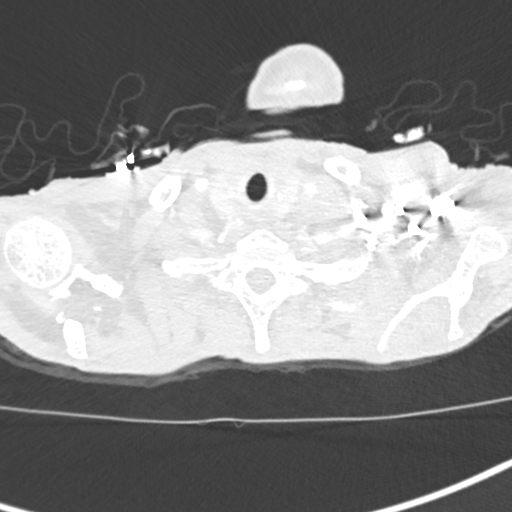

[Series 7: cor soft · coronal · 0.46mm/px · 3 of 101 slices shown]
[im 26/101  soft-tissue]
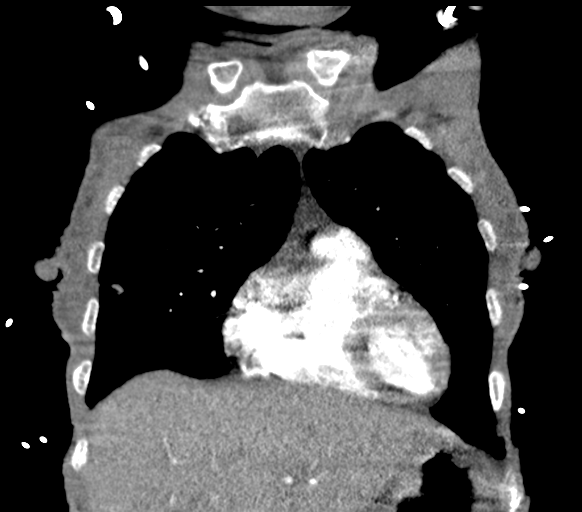
[im 51/101  soft-tissue]
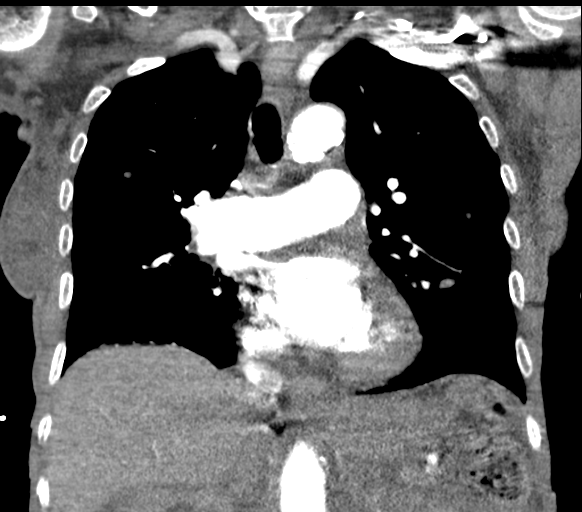
[im 76/101  soft-tissue]
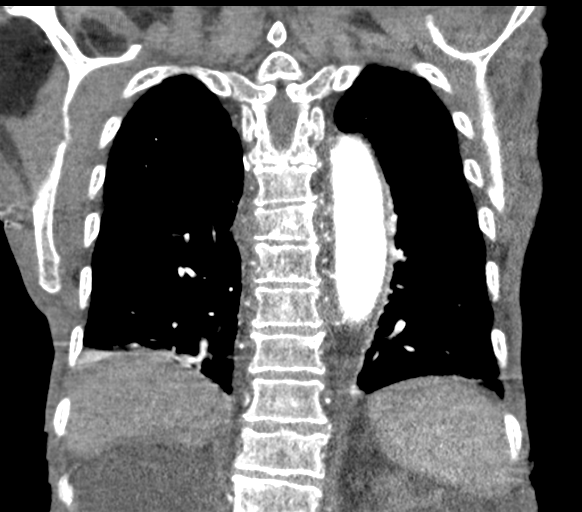

[18 of 46 positions shown; findings below may reference images not displayed]

FINDINGS: Cardiovascular: Heart size normal. Trace pericardial fluid. The RV
is nondilated. Satisfactory opacification of pulmonary arteries
noted, and there is no evidence of pulmonary emboli. There is
short-segment stenosis at the ostium of the inferior left pulmonary
vein. Scattered coronary calcifications. Adequate contrast
opacification of the thoracic aorta with no evidence of dissection,
aneurysm, or stenosis. There is bovine variant brachiocephalic arch
anatomy. Calcified plaque at the origin of the left subclavian
artery resulting in short-segment stenosis of possible hemodynamic
significance. Scattered calcified aortic plaque.

Mediastinum/Nodes: Stable prevascular and right paratracheal
adenopathy.

Lungs/Pleura: Trace pleural fluid bilaterally. No pneumothorax.
Stable scattered pulmonary nodules in all lobes largest 0.8 cm right
middle lobe ([DATE]) some increase in dependent atelectasis
posteriorly in both lower lobes.

Upper Abdomen: Multiple hepatic cysts. 1.6 cm segment 5 hepatic
lesion, previously 1.2. retrocrural and left para-aortic adenopathy
as before. No acute findings.

Musculoskeletal: Multiple lytic/permeative metastatic lesions in the
thoracic spine, ribs, and sternum as previously demonstrated. Mild
pathologic compression deformity of T6, new since 09/04/2019. Stable
T5, T9, and T11 compression deformities. Minimally displaced
pathologic fracture of right fourth rib laterally.

Review of the MIP images confirms the above findings.
IMPRESSION: 1. Negative for acute PE or thoracic aortic dissection.
2. Bilateral pulmonary nodules, osseous metastatic disease,
mediastinal and retroperitoneal adenopathy as before.
3. Pathologic fractures of right fourth rib and subacute compression
fracture of T6 vertebral body.
4. Origin stenosis of the left subclavian artery of possible
hemodynamic significance. Correlate with upper extremity pressures
and any evidence of subclavian steal.
5. Trace bilateral pleural fluid.

Aortic Atherosclerosis ([id number]).

## 2020-12-20 IMAGING — US US EXTREM LOW VENOUS
1 series · 13 of 24 positions shown · non-contrast
Comparison: None.

CLINICAL DATA: Initial evaluation for lower extremity edema.
History of renal cell carcinoma.



[Series 1: us venous img lower bilat (dvt) · portal-venous · 13 of 81 slices shown]
[im 1/81]
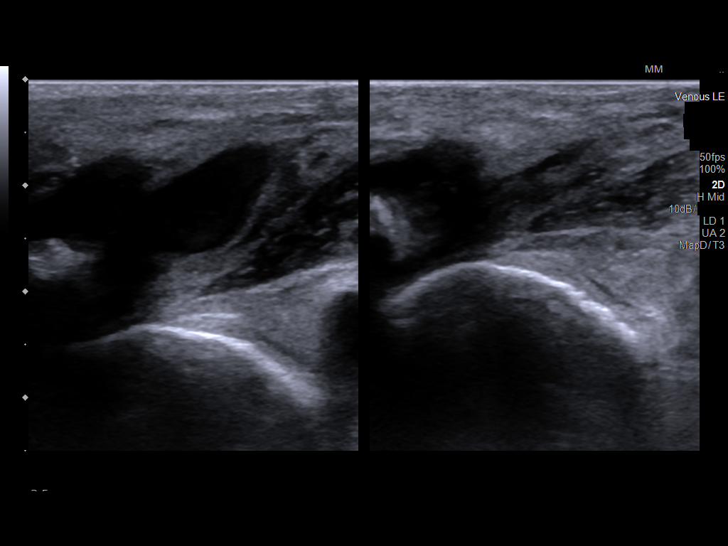
[im 7/81]
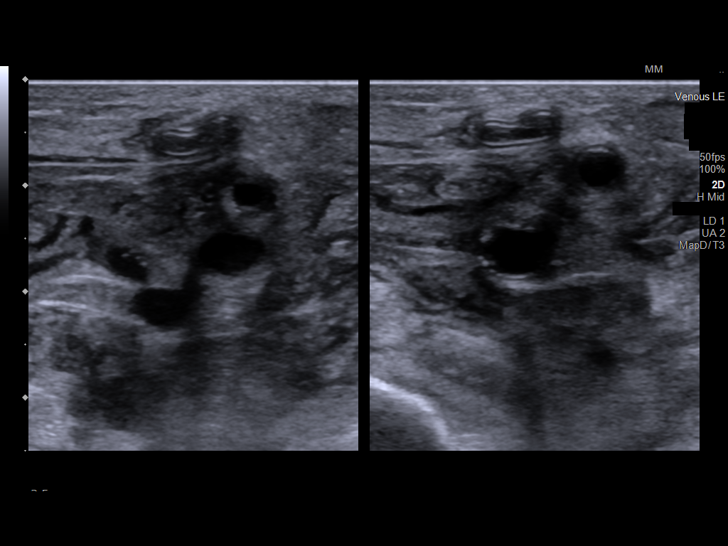
[im 14/81]
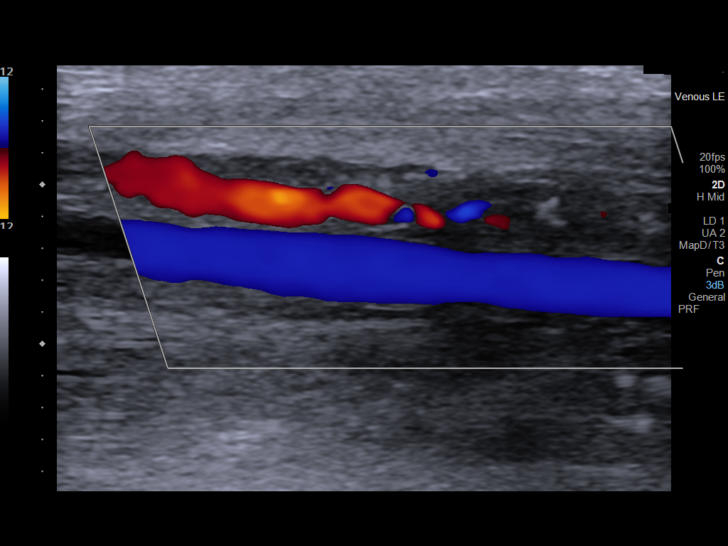
[im 21/81]
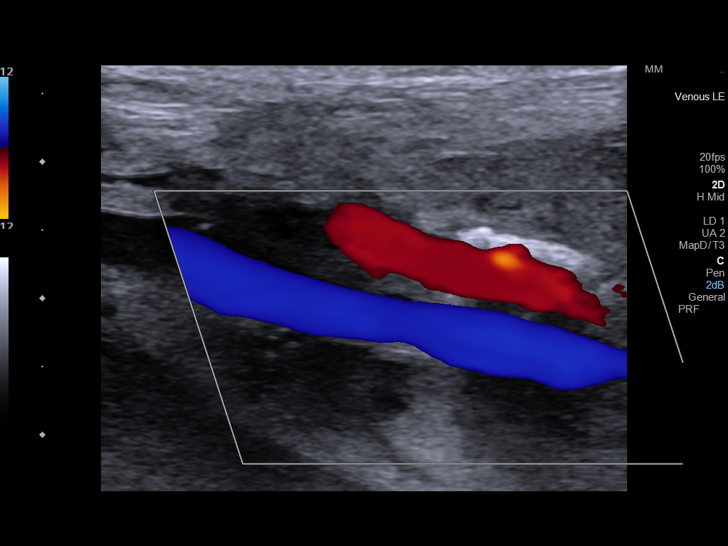
[im 28/81]
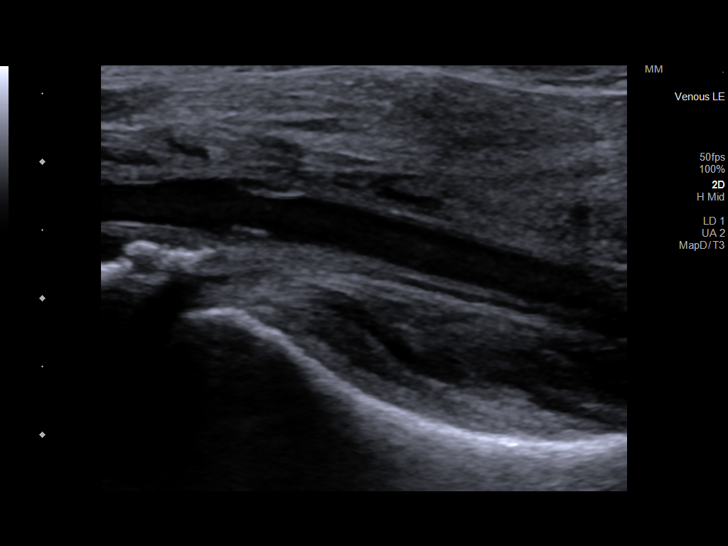
[im 35/81]
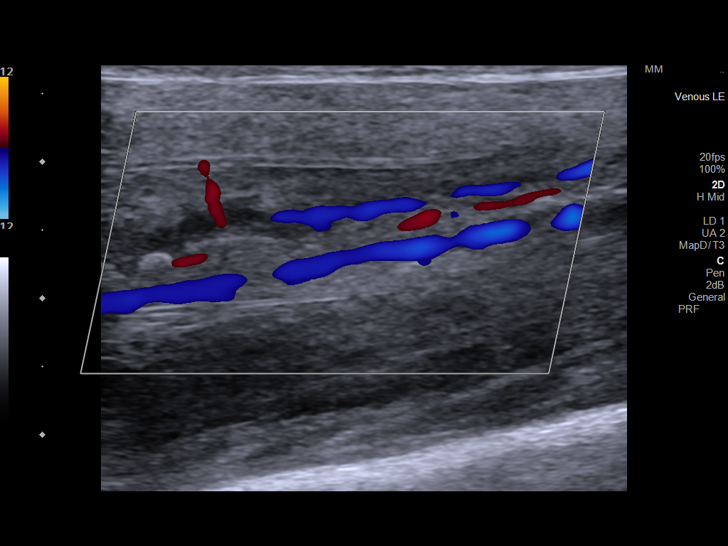
[im 42/81]
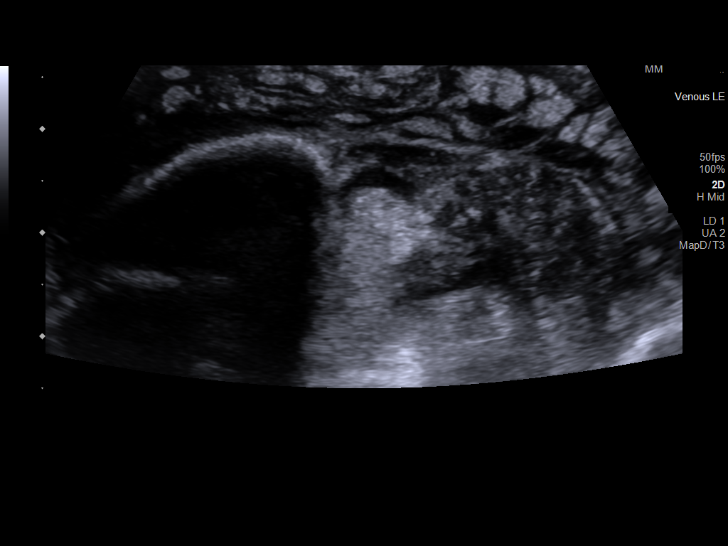
[im 46/81]
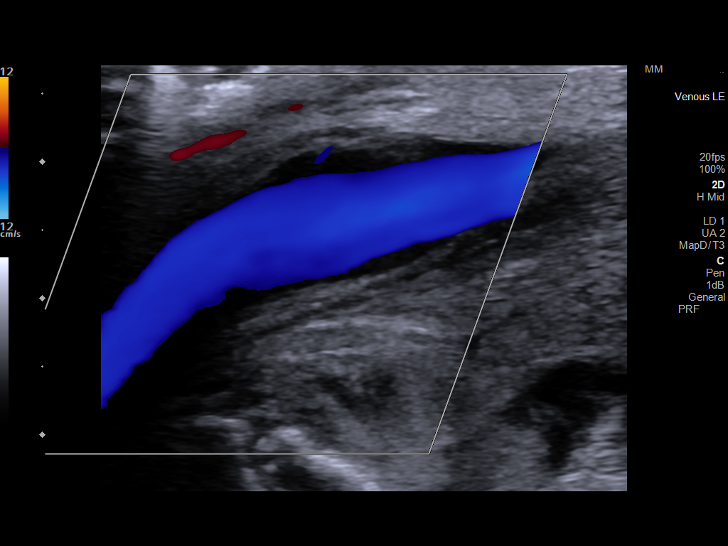
[im 53/81]
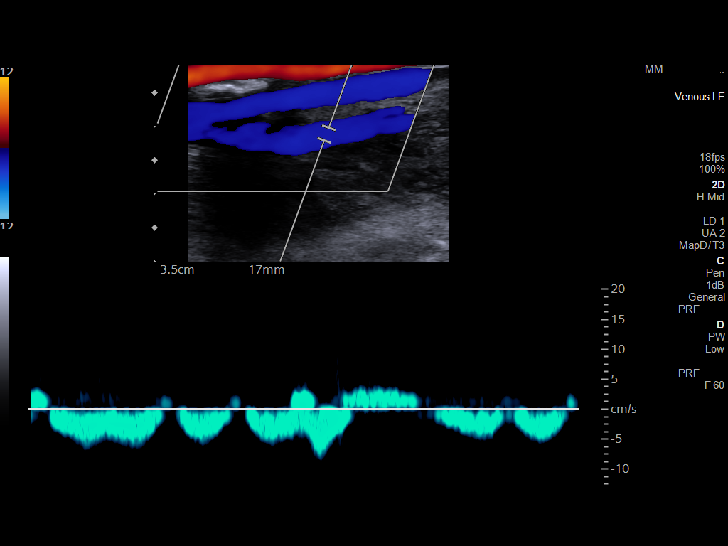
[im 60/81]
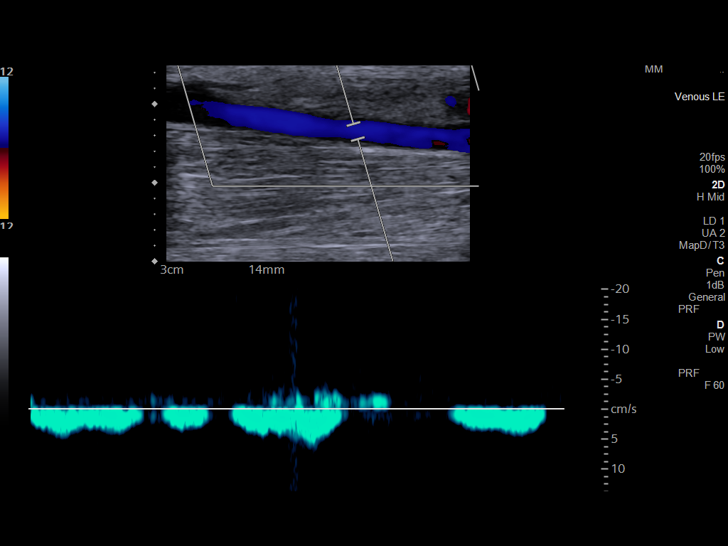
[im 67/81]
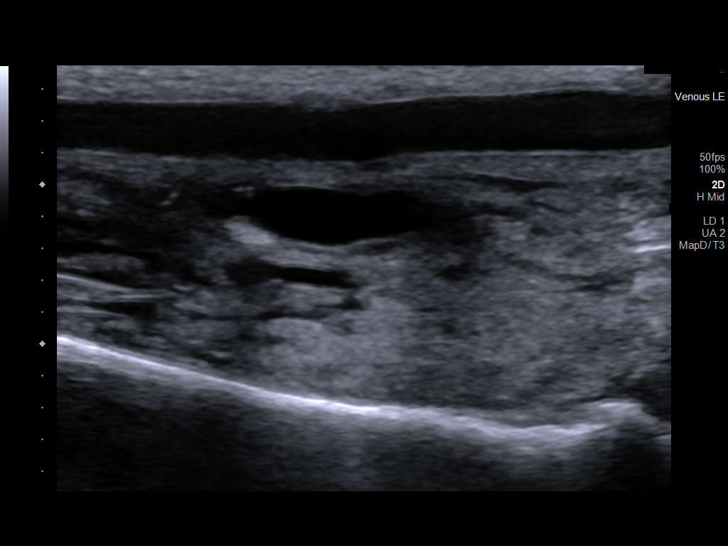
[im 74/81]
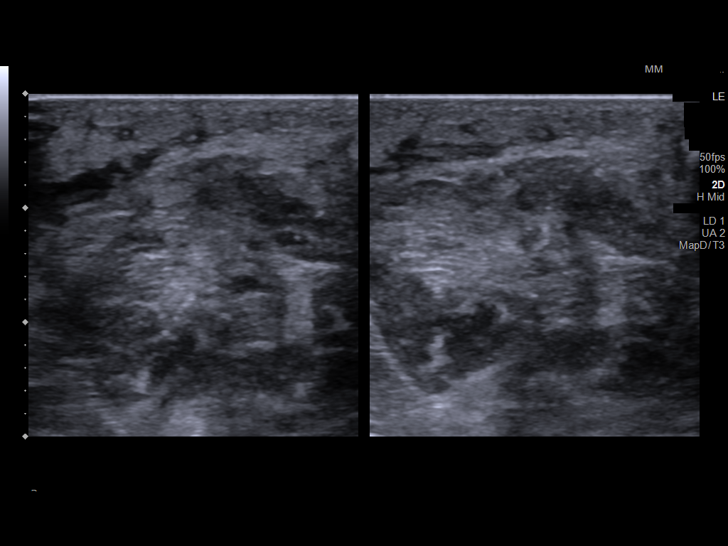
[im 81/81]
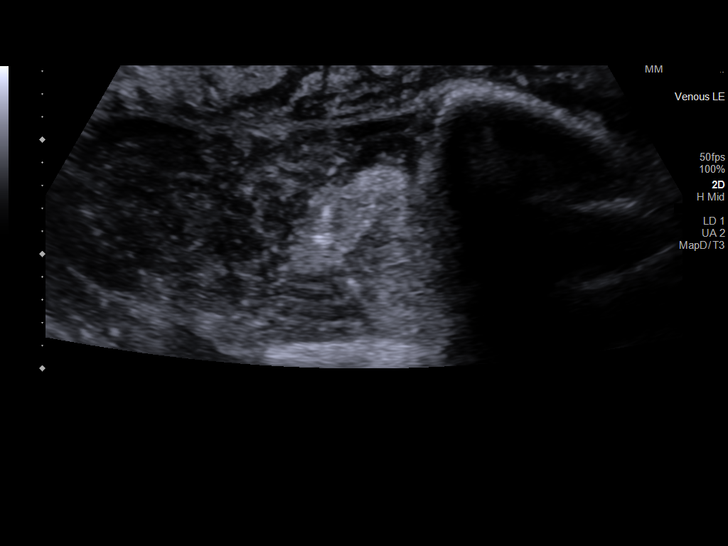

[13 of 24 positions shown; findings below may reference images not displayed]

FINDINGS: RIGHT LOWER EXTREMITY

Common Femoral Vein: No evidence of thrombus. Normal
compressibility, respiratory phasicity and response to augmentation.

Saphenofemoral Junction: No evidence of thrombus. Normal
compressibility and flow on color Doppler imaging.

Profunda Femoral Vein: No evidence of thrombus. Normal
compressibility and flow on color Doppler imaging.

Femoral Vein: No evidence of thrombus. Normal compressibility,
respiratory phasicity and response to augmentation.

Popliteal Vein: No evidence of thrombus. Normal compressibility,
respiratory phasicity and response to augmentation.

Calf Veins: No evidence of thrombus. Normal compressibility and flow
on color Doppler imaging.

Superficial Great Saphenous Vein: No evidence of thrombus. Normal
compressibility.

Venous Reflux:  None.

Other Findings: Diffuse soft tissue/interstitial edema within the
subcutaneous soft tissues of the right calf.

LEFT LOWER EXTREMITY

Common Femoral Vein: No evidence of thrombus. Normal
compressibility, respiratory phasicity and response to augmentation.

Saphenofemoral Junction: No evidence of thrombus. Normal
compressibility and flow on color Doppler imaging.

Profunda Femoral Vein: No evidence of thrombus. Normal
compressibility and flow on color Doppler imaging.

Femoral Vein: No evidence of thrombus. Normal compressibility,
respiratory phasicity and response to augmentation.

Popliteal Vein: No evidence of thrombus. Normal compressibility,
respiratory phasicity and response to augmentation.

Calf Veins: No evidence of thrombus. Normal compressibility and flow
on color Doppler imaging.

Superficial Great Saphenous Vein: No evidence of thrombus. Normal
compressibility.

Venous Reflux:  None.

Other Findings: Diffuse soft tissue/interstitial edema within the
subcutaneous soft tissues of the left calf.
IMPRESSION: 1. No evidence of deep venous thrombosis in either lower extremity.
2. Diffuse soft tissue/interstitial edema within the subcutaneous
soft tissues of both calves.
